# Patient Record
Sex: Male | Born: 1965
Health system: Southern US, Community
[De-identification: ages and names within clinical notes are randomized; demographics above are authoritative.]

## PROBLEM LIST (undated history)

## (undated) DIAGNOSIS — E079 Disorder of thyroid, unspecified: Secondary | ICD-10-CM

## (undated) DIAGNOSIS — R197 Diarrhea, unspecified: Secondary | ICD-10-CM

## (undated) DIAGNOSIS — I10 Essential (primary) hypertension: Secondary | ICD-10-CM

## (undated) DIAGNOSIS — E785 Hyperlipidemia, unspecified: Secondary | ICD-10-CM

## (undated) DIAGNOSIS — E119 Type 2 diabetes mellitus without complications: Secondary | ICD-10-CM

## (undated) HISTORY — DX: Essential (primary) hypertension: I10

## (undated) HISTORY — PX: INGUINAL HERNIA REPAIR: SUR1180

## (undated) HISTORY — PX: UMBILICAL HERNIA REPAIR: SUR1181

## (undated) HISTORY — DX: Hyperlipidemia, unspecified: E78.5

## (undated) HISTORY — DX: Type 2 diabetes mellitus without complications: E11.9

## (undated) HISTORY — PX: HERNIA REPAIR: SHX51

## (undated) HISTORY — PX: NASAL FRACTURE SURGERY: SHX718

---

## 1997-10-03 HISTORY — PX: NASAL FRACTURE SURGERY: SHX718

## 2005-06-27 ENCOUNTER — Emergency Department (HOSPITAL_COMMUNITY): Admission: EM | Admit: 2005-06-27 | Discharge: 2005-06-27 | Payer: Self-pay | Admitting: Emergency Medicine

## 2005-07-01 ENCOUNTER — Emergency Department (HOSPITAL_COMMUNITY): Admission: EM | Admit: 2005-07-01 | Discharge: 2005-07-01 | Payer: Self-pay | Admitting: Family Medicine

## 2006-04-21 ENCOUNTER — Emergency Department (HOSPITAL_COMMUNITY): Admission: EM | Admit: 2006-04-21 | Discharge: 2006-04-21 | Payer: Self-pay | Admitting: Family Medicine

## 2006-05-01 ENCOUNTER — Ambulatory Visit (HOSPITAL_COMMUNITY): Admission: RE | Admit: 2006-05-01 | Discharge: 2006-05-01 | Payer: Self-pay | Admitting: *Deleted

## 2009-05-08 ENCOUNTER — Emergency Department (HOSPITAL_COMMUNITY): Admission: EM | Admit: 2009-05-08 | Discharge: 2009-05-08 | Payer: Self-pay | Admitting: Family Medicine

## 2011-02-18 NOTE — Op Note (Signed)
NAMEGWYN, Dalton Arnold              ACCOUNT NO.:  [id number]   MEDICAL RECORD NO.:  [id number]          PATIENT TYPE:  AMB   LOCATION:  DAY                          FACILITY:  [facility]   PHYSICIAN:  Alfonse Ras, MD   DATE OF BIRTH:  07/27/1966   DATE OF PROCEDURE:  05/01/2006  DATE OF DISCHARGE:                                 OPERATIVE REPORT   PREOPERATIVE DIAGNOSIS:  Left inguinal hernia.   POSTOPERATIVE DIAGNOSIS:  Left inguinal hernia.   PROCEDURE:  Left inguinal hernia repair with mesh.   SURGEON:  Alfonse Ras, MD.   ANESTHESIA:  General.   DESCRIPTION OF PROCEDURE:  The patient was taken to the operating room,  placed in a supine position. After adequate general anesthesia was induced  using laryngeal mask, the left groin was prepped and draped in a normal  sterile fashion. Using an oblique incision over the left inguinal canal, I  dissected down onto the external oblique fascia.  This was opened along its  fibers.  The ilioinguinal nerve was dissected and identified and preserved.  The spermatic cord was run with a Penrose drain.  The indirect hernia sac  was identified and taken down to the internal ring where it was ligated with  a pursestring suture.  The floor of Hesselbach's triangle was reinforced  with #0 Surgilon sutures and a tension-free repair.  A piece of Prolene mesh  was then shaped and tacked using a running 2-0 Prolene suture tacking to the  pubic tubercle. The transversalis fascia of inguinal ligament split and  brought out lateral to the internal ring and tacked laterally.  The repair  was intact.  The fascia was closed with a running 3-0 Vicryl.  Skin was  closed with staples.  Tissues were injected with 0.5 Marcaine.  The patient  tolerated the procedure well and went to PACU in good condition.      Alfonse Ras, MD  Electronically Signed     KRE/MEDQ  D:  05/01/2006  T:  05/02/2006  Job:  [id number]

## 2012-07-16 ENCOUNTER — Encounter (HOSPITAL_COMMUNITY): Payer: Self-pay | Admitting: Emergency Medicine

## 2012-07-16 ENCOUNTER — Emergency Department (INDEPENDENT_AMBULATORY_CARE_PROVIDER_SITE_OTHER)
Admission: EM | Admit: 2012-07-16 | Discharge: 2012-07-16 | Disposition: A | Discharge status: Home / Self Care | Payer: Self-pay | Source: Home / Self Care

## 2012-07-16 DIAGNOSIS — M7989 Other specified soft tissue disorders: Secondary | ICD-10-CM

## 2012-07-16 DIAGNOSIS — S6390XA Sprain of unspecified part of unspecified wrist and hand, initial encounter: Secondary | ICD-10-CM

## 2012-07-16 DIAGNOSIS — S63619A Unspecified sprain of unspecified finger, initial encounter: Secondary | ICD-10-CM

## 2012-07-16 MED ORDER — IBUPROFEN 600 MG PO TABS: 600.0000 mg | ORAL_TABLET | Freq: Four times a day (QID) | ORAL | Status: DC | PRN

## 2012-07-16 NOTE — ED Notes (Signed)
Pt c/o right hand pain... He is an Journalist, newspaper and uses hands a lot... On Saturday, he noticed stiffness of right hand and particular his right index and middle finger... Pain and stiffness has gone down but is concerned.... Denies: fever, vomiting, nauseas, diarrhea.

## 2012-07-16 NOTE — ED Provider Notes (Signed)
History     CSN: [id number]  Arrival date & time 07/16/12  1320   None     Chief Complaint  Patient presents with  . Hand Pain    (Consider location/radiation/quality/duration/timing/severity/associated sxs/prior treatment) Patient is a 45 y.o. male presenting with hand pain. The history is provided by the patient.  Hand Pain This is a new problem. The current episode started 2 days ago. The problem occurs daily. The problem has been resolved. The symptoms are relieved by acetaminophen. He has tried acetaminophen for the symptoms. The treatment provided significant relief.  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 Noted stiffness in left middle finger with decreased ability to bend associated with swelling.  Swelling decreased but continued pain with movement. History reviewed. No pertinent past medical history.  History reviewed. No pertinent past surgical history.  No family history on file.  History  Substance Use Topics  . Smoking status: Current Every Day Smoker -- 1.0 packs/day    Types: Cigarettes  . Smokeless tobacco: Not on file  . Alcohol Use: Yes      Review of Systems  Constitutional: Negative.   Respiratory: Negative.   Cardiovascular: Negative.   Musculoskeletal: Positive for joint swelling.  Neurological: Negative.     Allergies  Review of patient's allergies indicates no known allergies.  Home Medications   Current Outpatient Rx  Name Route Sig Dispense Refill  . IBUPROFEN 600 MG PO TABS Oral Take 1 tablet (600 mg total) by mouth every 6 (six) hours as needed for pain. 30 tablet 0    BP 147/81  Pulse 66  Temp 98.8 F (37.1 C) (Oral)  Resp 16  SpO2 100%  Physical Exam  Nursing note and vitals reviewed. Constitutional: He is oriented to person, place, and time. Vital signs are normal. He appears well-developed and well-nourished. He is active and cooperative.  HENT:  Head: Normocephalic and atraumatic.  Eyes: Conjunctivae normal  are normal. Pupils are equal, round, and reactive to light. No scleral icterus.  Neck: Trachea normal. Neck supple.  Cardiovascular: Normal rate and regular rhythm.   Pulmonary/Chest: Effort normal and breath sounds normal.  Abdominal: Soft.  Musculoskeletal: Normal range of motion.       Right hand: He exhibits swelling. He exhibits normal range of motion, no tenderness, no bony tenderness, normal capillary refill, no deformity and no laceration. normal sensation noted. He exhibits no finger abduction, no thumb/finger opposition and no wrist extension trouble.       Hands: Neurological: He is alert and oriented to person, place, and time. He has normal reflexes. No cranial nerve deficit or sensory deficit.  Skin: Skin is warm and dry.  Psychiatric: He has a normal mood and affect. His speech is normal and behavior is normal. Judgment and thought content normal. Cognition and memory are normal.    ED Course  Procedures (including critical care time)  Labs Reviewed - No data to display No results found.   1. Finger sprain   2. Swelling of finger       MDM  Finger splint, nsaids.  Take medications as indicated.  Follow up with orthopedist as discussed.        Johnsie Kindred, NP 07/19/12 [phone]

## 2012-07-20 NOTE — ED Provider Notes (Signed)
Medical screening examination/treatment/procedure(s) were performed by resident physician or non-physician practitioner and as supervising physician I was immediately available for consultation/collaboration.   Barkley Bruns MD.    Linna Hoff, MD 07/20/12 [phone]

## 2014-02-19 ENCOUNTER — Encounter (HOSPITAL_COMMUNITY): Payer: Self-pay | Admitting: Emergency Medicine

## 2014-02-19 ENCOUNTER — Emergency Department (INDEPENDENT_AMBULATORY_CARE_PROVIDER_SITE_OTHER): Payer: BC Managed Care – PPO

## 2014-02-19 ENCOUNTER — Emergency Department (HOSPITAL_COMMUNITY)
Admission: EM | Admit: 2014-02-19 | Discharge: 2014-02-19 | Disposition: A | Discharge status: Home / Self Care | Payer: BC Managed Care – PPO | Source: Home / Self Care | Attending: Family Medicine | Admitting: Family Medicine

## 2014-02-19 DIAGNOSIS — M25569 Pain in unspecified knee: Secondary | ICD-10-CM

## 2014-02-19 MED ORDER — DICLOFENAC SODIUM 50 MG PO TBEC: 50.0000 mg | DELAYED_RELEASE_TABLET | Freq: Two times a day (BID) | ORAL | Status: DC | PRN

## 2014-02-19 NOTE — ED Notes (Signed)
Reports acute on set today of left knee pain.  States always kneeling at work doing work.  States pain is an achy sensation.  No otc meds taken.

## 2014-02-19 NOTE — Discharge Instructions (Signed)
Thank you for coming in today. Follow up with Dr. Alfonso Ramus at [facility] orthopedics soon.   Knee, Cartilage (Meniscus) Injury It is suspected that you have a torn cartilage (meniscus) in your knee. The menisci are made of tough cartilage, and fit between the surfaces of the thigh and leg bones. The menisci are "C"shaped and have a wedged profile. The wedged profile helps the stability of the joint by keeping the rounded femur surface from sliding off the flat tibial surface. The menisci are fed (nourished) by small blood vessels; but there is also a large area at the inner edge of the meniscus that does not have a good blood supply (avascular). This presents a problem when there is an injury to the meniscus, because areas without good blood supply heal poorly. As a result when there is a torn cartilage in the knee, surgery is often required to fix it. This is usually done with a surgical procedure less invasive than open surgery (arthroscopy). Some times open surgery of the knee is required if there is other damage. PURPOSE OF THE MENISCUS The medial meniscus rests on the medial tibial plateau. The tibia is the large bone in your lower leg (the shin bone). The medial tibial plateau is the upper end of the bone making up the inner part of your knee. The lateral meniscus serves the same purpose and is located on the outside of the knee. The menisci help to distribute your body weight across the knee joint; they act as shock absorbers. Without the meniscus present, the weight of your body would be unevenly applied to the bones in your legs (the femur and tibia). The femur is the large bone in your thigh. This uneven weight distribution would cause increased wear and tear on the cartilage lining the joint surfaces, leading to early damage (arthritis) of these areas. The presence of the menisci cartilage is necessary for a healthy knee. PURPOSE OF THE KNEE CARTILAGE The knee joint is made up of three bones: the  thigh bone (femur), the shin bone (tibia), and the knee cap (patella). The surfaces of these bones at the knee joint are covered with cartilage called articular cartilage. This smooth slippery surface allows the bones to slide against each other without causing bone damage. The meniscus sits between these cartilaginous surfaces of the bones. It distributes the weight evenly in the joints and helps with the stability of the joint (keeps the joint steady). HOME CARE INSTRUCTIONS  Use crutches and external braces as instructed.  Once home an ice pack applied to your injured knee may help with discomfort and keep the swelling down. An ice pack can be used for the first couple of days or as instructed.  Only take over-the-counter or prescription medicines for pain, discomfort, or fever as directed by your caregiver.  Call if you do not have relief of pain with medications or if there is increasing in pain.  Call if your foot becomes cold or blue.  You may resume normal diet and activities as directed.  Make sure to keep your appointment with your follow-up caregiver. This injury may require further evaluation and treatment beyond the temporary treatment given today. Document Released: 12/10/2002 Document Revised: 12/12/2011 Document Reviewed: 04/03/2009 [facility] Patient Information 2014 [place], [place].

## 2014-02-19 NOTE — ED Provider Notes (Signed)
Dalton Arnold is a 48 y.o. male who presents to Urgent Care today for left medial knee pain starting today without injury. Pain is located in the medial joint line. The pain is worse with activity better with rest. No radiating pain weakness or numbness. Patient works in a [employer] and asked to squat and kneel down. He has not tried any medications yet. He denies any fevers chills nausea vomiting or diarrhea.   History reviewed. No pertinent past medical history. History  Substance Use Topics  . Smoking status: Current Every Day Smoker -- 1.00 packs/day    Types: Cigarettes  . Smokeless tobacco: Not on file  . Alcohol Use: Yes   ROS as above Medications: No current facility-administered medications for this encounter.   Current Outpatient Prescriptions  Medication Sig Dispense Refill  . diclofenac (VOLTAREN) 50 MG EC tablet Take 1 tablet (50 mg total) by mouth 2 (two) times daily as needed.  60 tablet  0    Exam:  BP 148/91  Pulse 67  Temp(Src) 98.4 F (36.9 C) (Oral)  Resp 16  SpO2 100% Gen: Well NAD Knees: Left knee: Normal-appearing no effusion. Tender palpation medial joint line. Full range of motion without crepitation. Positive McMurray's test. Stable ligamentous exam.  Right knee: Normal-appearing no effusion. Tender palpation medial joint line. Full range of motion without crepitation. Negative McMurray's test. Stable ligamentous exam.  No results found for this or any previous visit (from the past 24 hour(s)). Dg Knee Complete 4 Views Left  02/19/2014   CLINICAL DATA:  Left knee pain without injury  EXAM: LEFT KNEE - COMPLETE 4+ VIEW  COMPARISON:  None.  FINDINGS: Minimal joint space narrowing is noted medially. No acute joint effusion is seen. No acute fracture or dislocation is noted.  IMPRESSION: Minimal joint space narrowing without acute abnormality.   Electronically Signed   By: Inez Catalina M.D.   On: 02/19/2014 14:44    Assessment and Plan: 48 y.o.  male with left knee pain. Concerning for medial meniscus injury. Plan to treat with diclofenac and followup with orthopedics.  Discussed warning signs or symptoms. Please see discharge instructions. Patient expresses understanding.    Gregor Hams, MD 02/19/14 1520

## 2014-06-24 ENCOUNTER — Emergency Department (HOSPITAL_COMMUNITY): Payer: BC Managed Care – PPO

## 2014-06-24 ENCOUNTER — Inpatient Hospital Stay (HOSPITAL_COMMUNITY)
Admission: EM | Admit: 2014-06-24 | Discharge: 2014-06-28 | DRG: 391 | Disposition: A | Discharge status: Home / Self Care | Payer: BC Managed Care – PPO | Attending: Internal Medicine | Admitting: Internal Medicine

## 2014-06-24 ENCOUNTER — Encounter (HOSPITAL_COMMUNITY): Payer: Self-pay | Admitting: Emergency Medicine

## 2014-06-24 DIAGNOSIS — K2980 Duodenitis without bleeding: Principal | ICD-10-CM | POA: Diagnosis present

## 2014-06-24 DIAGNOSIS — F17200 Nicotine dependence, unspecified, uncomplicated: Secondary | ICD-10-CM | POA: Diagnosis present

## 2014-06-24 DIAGNOSIS — Z789 Other specified health status: Secondary | ICD-10-CM

## 2014-06-24 DIAGNOSIS — K859 Acute pancreatitis without necrosis or infection, unspecified: Secondary | ICD-10-CM | POA: Diagnosis present

## 2014-06-24 DIAGNOSIS — K766 Portal hypertension: Secondary | ICD-10-CM | POA: Diagnosis present

## 2014-06-24 DIAGNOSIS — J439 Emphysema, unspecified: Secondary | ICD-10-CM

## 2014-06-24 DIAGNOSIS — E041 Nontoxic single thyroid nodule: Secondary | ICD-10-CM

## 2014-06-24 DIAGNOSIS — K2970 Gastritis, unspecified, without bleeding: Secondary | ICD-10-CM | POA: Diagnosis present

## 2014-06-24 DIAGNOSIS — Z7289 Other problems related to lifestyle: Secondary | ICD-10-CM

## 2014-06-24 DIAGNOSIS — E0789 Other specified disorders of thyroid: Secondary | ICD-10-CM | POA: Diagnosis present

## 2014-06-24 DIAGNOSIS — K429 Umbilical hernia without obstruction or gangrene: Secondary | ICD-10-CM | POA: Diagnosis present

## 2014-06-24 DIAGNOSIS — R1013 Epigastric pain: Secondary | ICD-10-CM | POA: Diagnosis not present

## 2014-06-24 DIAGNOSIS — K319 Disease of stomach and duodenum, unspecified: Secondary | ICD-10-CM | POA: Diagnosis present

## 2014-06-24 DIAGNOSIS — F1010 Alcohol abuse, uncomplicated: Secondary | ICD-10-CM | POA: Diagnosis present

## 2014-06-24 DIAGNOSIS — K2990 Gastroduodenitis, unspecified, without bleeding: Secondary | ICD-10-CM

## 2014-06-24 LAB — URINALYSIS, ROUTINE W REFLEX MICROSCOPIC
BILIRUBIN URINE: NEGATIVE
GLUCOSE, UA: NEGATIVE mg/dL
KETONES UR: NEGATIVE mg/dL
LEUKOCYTES UA: NEGATIVE
NITRITE: NEGATIVE
PH: 6.5 (ref 5.0–8.0)
Protein, ur: NEGATIVE mg/dL
SPECIFIC GRAVITY, URINE: 1.004 — AB (ref 1.005–1.030)
Urobilinogen, UA: 0.2 mg/dL (ref 0.0–1.0)

## 2014-06-24 LAB — CBC WITH DIFFERENTIAL/PLATELET
BASOS PCT: 0 % (ref 0–1)
Basophils Absolute: 0 10*3/uL (ref 0.0–0.1)
Eosinophils Absolute: 0.3 10*3/uL (ref 0.0–0.7)
Eosinophils Relative: 2 % (ref 0–5)
HCT: 44.7 % (ref 39.0–52.0)
Hemoglobin: 15.2 g/dL (ref 13.0–17.0)
LYMPHS ABS: 3.9 10*3/uL (ref 0.7–4.0)
Lymphocytes Relative: 32 % (ref 12–46)
MCH: 26.7 pg (ref 26.0–34.0)
MCHC: 34 g/dL (ref 30.0–36.0)
MCV: 78.6 fL (ref 78.0–100.0)
Monocytes Absolute: 0.7 10*3/uL (ref 0.1–1.0)
Monocytes Relative: 6 % (ref 3–12)
NEUTROS PCT: 60 % (ref 43–77)
Neutro Abs: 7.3 10*3/uL (ref 1.7–7.7)
PLATELETS: 337 10*3/uL (ref 150–400)
RBC: 5.69 MIL/uL (ref 4.22–5.81)
RDW: 14.5 % (ref 11.5–15.5)
WBC: 12.2 10*3/uL — ABNORMAL HIGH (ref 4.0–10.5)

## 2014-06-24 LAB — COMPREHENSIVE METABOLIC PANEL
ALT: 17 U/L (ref 0–53)
ANION GAP: 14 (ref 5–15)
AST: 18 U/L (ref 0–37)
Albumin: 4.3 g/dL (ref 3.5–5.2)
Alkaline Phosphatase: 77 U/L (ref 39–117)
BILIRUBIN TOTAL: 0.2 mg/dL — AB (ref 0.3–1.2)
BUN: 10 mg/dL (ref 6–23)
CALCIUM: 9.4 mg/dL (ref 8.4–10.5)
CO2: 23 mEq/L (ref 19–32)
CREATININE: 0.72 mg/dL (ref 0.50–1.35)
Chloride: 101 mEq/L (ref 96–112)
Glucose, Bld: 83 mg/dL (ref 70–99)
Potassium: 4 mEq/L (ref 3.7–5.3)
SODIUM: 138 meq/L (ref 137–147)
Total Protein: 7.5 g/dL (ref 6.0–8.3)

## 2014-06-24 LAB — URINE MICROSCOPIC-ADD ON

## 2014-06-24 LAB — PRO B NATRIURETIC PEPTIDE: PRO B NATRI PEPTIDE: 102.6 pg/mL (ref 0–125)

## 2014-06-24 LAB — LIPASE, BLOOD: LIPASE: 24 U/L (ref 11–59)

## 2014-06-24 LAB — TROPONIN I: Troponin I: <0.3 ng/mL (ref <0.30)

## 2014-06-24 MED ORDER — MORPHINE SULFATE 4 MG/ML IJ SOLN
4.0000 mg | Freq: Once | INTRAMUSCULAR | Status: AC
  Administered 2014-06-24: 4 mg via INTRAVENOUS
  Filled 2014-06-24: qty 1

## 2014-06-24 MED ORDER — ONDANSETRON 4 MG PO TBDP
4.0000 mg | ORAL_TABLET | Freq: Once | ORAL | Status: AC
  Administered 2014-06-24: 4 mg via ORAL
  Filled 2014-06-24: qty 1

## 2014-06-24 MED ORDER — SODIUM CHLORIDE 0.9 % IV BOLUS (SEPSIS)
1000.0000 mL | Freq: Once | INTRAVENOUS | Status: AC
  Administered 2014-06-24: 1000 mL via INTRAVENOUS

## 2014-06-24 MED ORDER — IOHEXOL 350 MG/ML SOLN
100.0000 mL | Freq: Once | INTRAVENOUS | Status: AC | PRN
  Administered 2014-06-24: 100 mL via INTRAVENOUS

## 2014-06-24 NOTE — ED Notes (Signed)
Bed: WA16 Expected date:  Expected time:  Means of arrival:  Comments: Triage 1 

## 2014-06-24 NOTE — ED Provider Notes (Signed)
CSN: [id number]     Arrival date & time 06/24/14  1739 History   First MD Initiated Contact with Patient 06/24/14 1758     Chief Complaint  Patient presents with  . Chest Pain     (Consider location/radiation/quality/duration/timing/severity/associated sxs/prior Treatment) The history is provided by the patient. No language interpreter was used.  Dalton Arnold is a 48 y/o M with no known significant PMHx presenting to the ED with chest pain that started on Sunday. Patient reported that the discomfort is localized to the epigastric region described as a burning sensation and stabbing pain that is intermittent with radiation to the chest wall. Stated that he took 2 Aleves yesterday morning and afternoon and this morning, reported that he took an ASA 81 mg today. Stated that when the pain increases he does get shortness of breath. Reported that he has been feeling nauseous. Smokes cigarettes. Denied difficulty breathing, diarrhea, hematochezia, melena, fevers, chills, cough, leg swelling, headache, dizziness, blurred vision, sudden loss of vision, hematuria, urinary symptoms, travel. PCP none   History reviewed. No pertinent past medical history. History reviewed. No pertinent past surgical history. History reviewed. No pertinent family history. History  Substance Use Topics  . Smoking status: Current Every Day Smoker -- 1.00 packs/day    Types: Cigarettes  . Smokeless tobacco: Not on file  . Alcohol Use: Yes    Review of Systems  Constitutional: Negative for fever and chills.  Respiratory: Positive for shortness of breath.   Cardiovascular: Positive for chest pain.  Gastrointestinal: Positive for nausea and abdominal pain. Negative for vomiting, diarrhea, constipation, blood in stool and anal bleeding.  Genitourinary: Negative for dysuria, hematuria and decreased urine volume.  Musculoskeletal: Negative for back pain, neck pain and neck stiffness.  Neurological: Negative for  dizziness, weakness, numbness and headaches.      Allergies  Review of patient's allergies indicates no known allergies.  Home Medications   Prior to Admission medications   Medication Sig Start Date End Date Taking? Authorizing Provider  naproxen sodium (ANAPROX) 220 MG tablet Take 440 mg by mouth 2 (two) times daily with a meal.   Yes Historical Provider, MD   BP 136/91  Pulse 71  Temp(Src) 98.3 F (36.8 C) (Oral)  Resp 18  SpO2 95% Physical Exam  Nursing note and vitals reviewed. Constitutional: He is oriented to person, place, and time. He appears well-developed and well-nourished. No distress.  HENT:  Head: Normocephalic and atraumatic.  Mouth/Throat: Oropharynx is clear and moist. No oropharyngeal exudate.  Eyes: Conjunctivae and EOM are normal. Pupils are equal, round, and reactive to light. Right eye exhibits no discharge. Left eye exhibits no discharge.  Neck: Normal range of motion. Neck supple. No tracheal deviation present.  Cardiovascular: Normal rate, regular rhythm and normal heart sounds.  Exam reveals no friction rub.   No murmur heard. Pulmonary/Chest: Effort normal and breath sounds normal. No respiratory distress. He has no wheezes. He has no rales. He exhibits no tenderness.  Abdominal: Soft. Normal appearance and bowel sounds are normal. He exhibits no distension. There is tenderness in the epigastric area. There is no rebound and no guarding.  Negative abdominal distension  BS normoactive in all 4 quadrants Abdomen soft upon palpation  Discomfort upon palpation to the abdomen - generalized - with most discomfort upon palpation to the epigastric region Negative peritoneal signs, guarding or rigidity noted  Musculoskeletal: Normal range of motion.  Full ROM to upper and lower extremities without difficulty noted, negative ataxia noted.  Lymphadenopathy:    He has no cervical adenopathy.  Neurological: He is alert and oriented to person, place, and time.  No cranial nerve deficit. He exhibits normal muscle tone. Coordination normal.  Cranial nerves III-XII grossly intact Strength 5+/5+ to upper and lower extremities bilaterally with resistance applied, equal distribution noted Equal grip strength bilaterally  Negative facial drooping Negative slurred speech  Negative aphasia Negative arm drift Fine motor skills intact  Skin: Skin is warm and dry. No rash noted. He is not diaphoretic. No erythema.  Psychiatric: He has a normal mood and affect. His behavior is normal. Thought content normal.    ED Course  Procedures (including critical care time)  12:41 AM This provider re-assessed the patient. Patient continues to have pain in his abdomen. Reported that he continues to not feel well.   1:35 AM This provider spoke with Dr. Posey Pronto, [facility]-discussed case, history, labs, imaging, vitals, ED course in great detail. Patient to be admitted to the hospital for pain control regarding acute duodenitis versus possible beginnings of acute pancreatitis, did not recommend Cipro and Flagyl to be given-recommended patient to continue to have IV fluids administered. Patient admitted to [facility].  Results for orders placed during the hospital encounter of 06/24/14  CBC WITH DIFFERENTIAL      Result Value Ref Range   WBC 12.2 (*) 4.0 - 10.5 K/uL   RBC 5.69  4.22 - 5.81 MIL/uL   Hemoglobin 15.2  13.0 - 17.0 g/dL   HCT 44.7  39.0 - 52.0 %   MCV 78.6  78.0 - 100.0 fL   MCH 26.7  26.0 - 34.0 pg   MCHC 34.0  30.0 - 36.0 g/dL   RDW 14.5  11.5 - 15.5 %   Platelets 337  150 - 400 K/uL   Neutrophils Relative % 60  43 - 77 %   Neutro Abs 7.3  1.7 - 7.7 K/uL   Lymphocytes Relative 32  12 - 46 %   Lymphs Abs 3.9  0.7 - 4.0 K/uL   Monocytes Relative 6  3 - 12 %   Monocytes Absolute 0.7  0.1 - 1.0 K/uL   Eosinophils Relative 2  0 - 5 %   Eosinophils Absolute 0.3  0.0 - 0.7 K/uL   Basophils Relative 0  0 - 1 %   Basophils Absolute 0.0  0.0 - 0.1 K/uL   COMPREHENSIVE METABOLIC PANEL      Result Value Ref Range   Sodium 138  137 - 147 mEq/L   Potassium 4.0  3.7 - 5.3 mEq/L   Chloride 101  96 - 112 mEq/L   CO2 23  19 - 32 mEq/L   Glucose, Bld 83  70 - 99 mg/dL   BUN 10  6 - 23 mg/dL   Creatinine, Ser 0.72  0.50 - 1.35 mg/dL   Calcium 9.4  8.4 - 10.5 mg/dL   Total Protein 7.5  6.0 - 8.3 g/dL   Albumin 4.3  3.5 - 5.2 g/dL   AST 18  0 - 37 U/L   ALT 17  0 - 53 U/L   Alkaline Phosphatase 77  39 - 117 U/L   Total Bilirubin 0.2 (*) 0.3 - 1.2 mg/dL   GFR calc non Af Amer >90  >90 mL/min   GFR calc Af Amer >90  >90 mL/min   Anion gap 14  5 - 15  TROPONIN I      Result Value Ref Range   Troponin I <  0.30  <0.30 ng/mL  PRO B NATRIURETIC PEPTIDE      Result Value Ref Range   Pro B Natriuretic peptide (BNP) 102.6  0 - 125 pg/mL  LIPASE, BLOOD      Result Value Ref Range   Lipase 24  11 - 59 U/L  URINALYSIS, ROUTINE W REFLEX MICROSCOPIC      Result Value Ref Range   Color, Urine YELLOW  YELLOW   APPearance CLEAR  CLEAR   Specific Gravity, Urine 1.004 (*) 1.005 - 1.030   pH 6.5  5.0 - 8.0   Glucose, UA NEGATIVE  NEGATIVE mg/dL   Hgb urine dipstick SMALL (*) NEGATIVE   Bilirubin Urine NEGATIVE  NEGATIVE   Ketones, ur NEGATIVE  NEGATIVE mg/dL   Protein, ur NEGATIVE  NEGATIVE mg/dL   Urobilinogen, UA 0.2  0.0 - 1.0 mg/dL   Nitrite NEGATIVE  NEGATIVE   Leukocytes, UA NEGATIVE  NEGATIVE  URINE MICROSCOPIC-ADD ON      Result Value Ref Range   Squamous Epithelial / LPF RARE  RARE   RBC / HPF 3-6  <3 RBC/hpf  TROPONIN I      Result Value Ref Range   Troponin I <0.30  <0.30 ng/mL    Labs Review Labs Reviewed  CBC WITH DIFFERENTIAL - Abnormal; Notable for the following:    WBC 12.2 (*)    All other components within normal limits  COMPREHENSIVE METABOLIC PANEL - Abnormal; Notable for the following:    Total Bilirubin 0.2 (*)    All other components within normal limits  URINALYSIS, ROUTINE W REFLEX MICROSCOPIC - Abnormal; Notable  for the following:    Specific Gravity, Urine 1.004 (*)    Hgb urine dipstick SMALL (*)    All other components within normal limits  TROPONIN I  PRO B NATRIURETIC PEPTIDE  LIPASE, BLOOD  URINE MICROSCOPIC-ADD ON  TROPONIN I  ETHANOL    Imaging Review Dg Chest 2 View  06/24/2014   CLINICAL DATA:  Chest pain for 2 days  EXAM: CHEST  2 VIEW  COMPARISON:  04/28/2006  FINDINGS: Left lower lobe hazy airspace disease which may reflect atelectasis versus pneumonia. No other focal consolidation, pleural effusion or pneumothorax. The heart and mediastinal contours are unremarkable.  The osseous structures are unremarkable.  IMPRESSION: Hazy left lower lobe airspace disease concerning for atelectasis versus pneumonia.   Electronically Signed   By: Kathreen Devoid   On: 06/24/2014 19:24   Ct Angio Chest Pe W/cm &/or Wo Cm  06/24/2014   CLINICAL DATA:  Chest pain, abdominal pain, nausea  EXAM: CT CHEST, ABDOMEN AND PELVIS WITHOUT CONTRAST  TECHNIQUE: Multidetector CT imaging of the chest, abdomen and pelvis was performed following the standard protocol without IV contrast.  COMPARISON:  Prior radiograph from 06/24/2014  FINDINGS: CT CHEST FINDINGS  Large heterogeneous predominantly hypodense nodule measuring approximately 3.9 x 3.6 cm seen within the left thyroid lobe. The tracheal air column is bowed to the right at this level but remains widely patent.  No pathologically enlarged mediastinal, hilar, or axillary lymph nodes are identified.  Intrathoracic aorta is of normal caliber and appearance. Great vessels within normal limits. Minimal atherosclerotic plaque present at the origin of the left common carotid artery.  Heart size is normal. No pericardial effusion. Diffuse 3 vessel coronary artery calcifications present.  Pulmonary arterial tree is well opacified. No filling defect to suggest acute pulmonary embolism identified. Re-formatted imaging confirms these findings.  Subsegmental atelectasis seen  dependently within  the visualized lung bases. No focal infiltrates identified. No pulmonary edema or pleural effusion. No worrisome pulmonary nodule or mass. Mild upper lobe predominant centrilobular emphysema present.  No acute osseus abnormality. No worrisome lytic or blastic osseous lesions.  CT ABDOMEN AND PELVIS FINDINGS  The liver demonstrates a normal contrast enhanced appearance. Gallbladder within normal limits. The spleen and adrenal glands are unremarkable. The pancreas is within normal limits. Delete that  Scattered cyst noted within the bilateral kidneys. No nephrolithiasis, hydronephrosis, or focal enhancing renal mass.  Stomach within normal limits.  There are circumferential mucosal enhancement with wall thickening and inflammatory stranding about the second portion of the duodenum, suggesting acute to edematous. These inflammatory changes closely approximates the pancreatic head, although the pancreas itself is favored to be within normal limits. No evidence of bowel obstruction. Appendix is normal. No other acute inflammatory changes seen about the bowels.  Fact containing paraumbilical hernia noted.  Bladder and prostate within normal limits.  No free air or fluid. No pathologically enlarged intra-abdominal pelvic lymph nodes. Moderate aorto bi-iliac atherosclerotic calcifications present without aneurysm.  No acute osseous abnormality. No worrisome lytic or blastic osseous lesion.  IMPRESSION: CT CHEST IMPRESSION:  1. No CT evidence of acute pulmonary embolism or other acute cardiopulmonary abnormality. 2. Mild upper lobe predominant centrilobular emphysema. 3. 3.9 x 3.6 cm left thyroid nodule. Further evaluation with dedicated thyroid ultrasound recommended. This could be performed on a nonemergent basis.  CT ABDOMEN AND PELVIS IMPRESSION:  1. Circumferential wall thickening with mucosal enhancement about the second portion of the duodenum, suggesting acute duodenitis. Inflammatory stranding  within the adjacent peripancreatic fat is favored to be reactive in nature from the acute process in the duodenum, although possible acute pancreatitis not entirely excluded. Correlation with serum lipase recommended. 2. No other acute intra-abdominal or pelvic process.   Electronically Signed   By: Jeannine Boga M.D.   On: 06/24/2014 23:16   Ct Abdomen Pelvis W Contrast  06/24/2014   CLINICAL DATA:  Chest pain, abdominal pain, nausea  EXAM: CT CHEST, ABDOMEN AND PELVIS WITHOUT CONTRAST  TECHNIQUE: Multidetector CT imaging of the chest, abdomen and pelvis was performed following the standard protocol without IV contrast.  COMPARISON:  Prior radiograph from 06/24/2014  FINDINGS: CT CHEST FINDINGS  Large heterogeneous predominantly hypodense nodule measuring approximately 3.9 x 3.6 cm seen within the left thyroid lobe. The tracheal air column is bowed to the right at this level but remains widely patent.  No pathologically enlarged mediastinal, hilar, or axillary lymph nodes are identified.  Intrathoracic aorta is of normal caliber and appearance. Great vessels within normal limits. Minimal atherosclerotic plaque present at the origin of the left common carotid artery.  Heart size is normal. No pericardial effusion. Diffuse 3 vessel coronary artery calcifications present.  Pulmonary arterial tree is well opacified. No filling defect to suggest acute pulmonary embolism identified. Re-formatted imaging confirms these findings.  Subsegmental atelectasis seen dependently within the visualized lung bases. No focal infiltrates identified. No pulmonary edema or pleural effusion. No worrisome pulmonary nodule or mass. Mild upper lobe predominant centrilobular emphysema present.  No acute osseus abnormality. No worrisome lytic or blastic osseous lesions.  CT ABDOMEN AND PELVIS FINDINGS  The liver demonstrates a normal contrast enhanced appearance. Gallbladder within normal limits. The spleen and adrenal glands are  unremarkable. The pancreas is within normal limits. Delete that  Scattered cyst noted within the bilateral kidneys. No nephrolithiasis, hydronephrosis, or focal enhancing renal mass.  Stomach within normal limits.  There are circumferential mucosal enhancement with wall thickening and inflammatory stranding about the second portion of the duodenum, suggesting acute to edematous. These inflammatory changes closely approximates the pancreatic head, although the pancreas itself is favored to be within normal limits. No evidence of bowel obstruction. Appendix is normal. No other acute inflammatory changes seen about the bowels.  Fact containing paraumbilical hernia noted.  Bladder and prostate within normal limits.  No free air or fluid. No pathologically enlarged intra-abdominal pelvic lymph nodes. Moderate aorto bi-iliac atherosclerotic calcifications present without aneurysm.  No acute osseous abnormality. No worrisome lytic or blastic osseous lesion.  IMPRESSION: CT CHEST IMPRESSION:  1. No CT evidence of acute pulmonary embolism or other acute cardiopulmonary abnormality. 2. Mild upper lobe predominant centrilobular emphysema. 3. 3.9 x 3.6 cm left thyroid nodule. Further evaluation with dedicated thyroid ultrasound recommended. This could be performed on a nonemergent basis.  CT ABDOMEN AND PELVIS IMPRESSION:  1. Circumferential wall thickening with mucosal enhancement about the second portion of the duodenum, suggesting acute duodenitis. Inflammatory stranding within the adjacent peripancreatic fat is favored to be reactive in nature from the acute process in the duodenum, although possible acute pancreatitis not entirely excluded. Correlation with serum lipase recommended. 2. No other acute intra-abdominal or pelvic process.   Electronically Signed   By: Jeannine Boga M.D.   On: 06/24/2014 23:16     EKG Interpretation None      MDM   Final diagnoses:  Duodenitis  Acute pancreatitis, unspecified  pancreatitis type  Pulmonary emphysema, unspecified emphysema type  Left thyroid nodule    Medications  ondansetron (ZOFRAN-ODT) disintegrating tablet 4 mg (4 mg Oral Given 06/24/14 1844)  sodium chloride 0.9 % bolus 1,000 mL (0 mLs Intravenous Stopped 06/24/14 2011)  morphine 4 MG/ML injection 4 mg (4 mg Intravenous Given 06/24/14 2220)  iohexol (OMNIPAQUE) 350 MG/ML injection 100 mL (100 mLs Intravenous Contrast Given 06/24/14 2235)  morphine 4 MG/ML injection 4 mg (4 mg Intravenous Given 06/25/14 0050)  sodium chloride 0.9 % bolus 1,000 mL (1,000 mLs Intravenous New Bag/Given 06/25/14 0050)   Filed Vitals:   06/24/14 1749 06/24/14 1942 06/24/14 2143 06/24/14 2144  BP: 142/98 153/99 153/95 136/91  Pulse: 70 68 71 71  Temp: 98.3 F (36.8 C)     TempSrc: Oral     Resp: 17 16 20 18   SpO2: 98% 100% 95% 95%   EKG normal sinus rhythm with a heart rate of 71 bpm. Troponin negative elevation. Second troponin negative elevation. BNP negative elevation. CBC noted mildly elevated white blood cell count of 12.2. CMP unremarkable. Lipase unremarkable. Urinalysis unremarkable. Chest x-ray noted hazy left lower lobe airspace disease concerning for atelectasis versus pneumonia. CT abdomen pelvis with contrast noted a wall thickening with mucosal enhancement around the second portion of the duodenum suggesting duodenitis. Stranding along the peripancreatic fat favored to be possible acute pancreatitis. CT chest mild upper lobe emphysema noted-no acute pulmonary embolism identified. Patient has been in ED setting for over 7 hours and continues to have abdominal pain, pain medications administered via IV-discomfort continues to remain. Nausea resolved. Mild leukocytosis noted-suspicion of acute duodenitis versus possible beginnings of pancreatitis. Left thyroid nodule identified-discussed this with patient in great detail. Patient does drink alcohol, whiskey, occasionally over the weekend. Patient to be admitted to  the hospital regarding pain control. Discussed case in great detail with Dr. Chiquita Loth to be admitted to [place]. Discussed plan for admission with patient who agrees to plan of care. Patient stable  for transfer.  Jamse Mead, PA-C 06/25/14 [phone]

## 2014-06-24 NOTE — ED Notes (Signed)
Pt sts he took 2 aleve and 1 aspirin to help with the pain this am, approx 10am, and another 2 additional aleve around 2pm.

## 2014-06-24 NOTE — ED Notes (Signed)
Pt reports to ED for upper abdominal pain radiating to central chest and nipples starting Saturday, as well as nausea. Denies dizziness, SOB, weakness.

## 2014-06-24 NOTE — ED Notes (Signed)
Pt is tearful, expressing stresses in life.  Spent time with him and comforted him.

## 2014-06-25 ENCOUNTER — Encounter (HOSPITAL_COMMUNITY): Payer: Self-pay | Admitting: Emergency Medicine

## 2014-06-25 ENCOUNTER — Inpatient Hospital Stay (HOSPITAL_COMMUNITY): Payer: BC Managed Care – PPO

## 2014-06-25 DIAGNOSIS — K766 Portal hypertension: Secondary | ICD-10-CM | POA: Diagnosis present

## 2014-06-25 DIAGNOSIS — K2990 Gastroduodenitis, unspecified, without bleeding: Secondary | ICD-10-CM | POA: Diagnosis present

## 2014-06-25 DIAGNOSIS — F17200 Nicotine dependence, unspecified, uncomplicated: Secondary | ICD-10-CM | POA: Diagnosis present

## 2014-06-25 DIAGNOSIS — K429 Umbilical hernia without obstruction or gangrene: Secondary | ICD-10-CM | POA: Diagnosis present

## 2014-06-25 DIAGNOSIS — R1013 Epigastric pain: Secondary | ICD-10-CM | POA: Diagnosis present

## 2014-06-25 DIAGNOSIS — K2980 Duodenitis without bleeding: Secondary | ICD-10-CM | POA: Diagnosis present

## 2014-06-25 DIAGNOSIS — K859 Acute pancreatitis without necrosis or infection, unspecified: Secondary | ICD-10-CM

## 2014-06-25 DIAGNOSIS — Z789 Other specified health status: Secondary | ICD-10-CM

## 2014-06-25 DIAGNOSIS — K319 Disease of stomach and duodenum, unspecified: Secondary | ICD-10-CM | POA: Diagnosis present

## 2014-06-25 DIAGNOSIS — Z7289 Other problems related to lifestyle: Secondary | ICD-10-CM | POA: Diagnosis present

## 2014-06-25 DIAGNOSIS — E0789 Other specified disorders of thyroid: Secondary | ICD-10-CM | POA: Diagnosis present

## 2014-06-25 DIAGNOSIS — I369 Nonrheumatic tricuspid valve disorder, unspecified: Secondary | ICD-10-CM

## 2014-06-25 DIAGNOSIS — F1010 Alcohol abuse, uncomplicated: Secondary | ICD-10-CM | POA: Diagnosis present

## 2014-06-25 DIAGNOSIS — K2970 Gastritis, unspecified, without bleeding: Secondary | ICD-10-CM | POA: Diagnosis present

## 2014-06-25 LAB — CBC WITH DIFFERENTIAL/PLATELET
Basophils Absolute: 0 10*3/uL (ref 0.0–0.1)
Basophils Relative: 0 % (ref 0–1)
Eosinophils Absolute: 0.3 10*3/uL (ref 0.0–0.7)
Eosinophils Relative: 2 % (ref 0–5)
HCT: 43.4 % (ref 39.0–52.0)
HEMOGLOBIN: 14.4 g/dL (ref 13.0–17.0)
Lymphocytes Relative: 32 % (ref 12–46)
Lymphs Abs: 3.8 10*3/uL (ref 0.7–4.0)
MCH: 26.3 pg (ref 26.0–34.0)
MCHC: 33.2 g/dL (ref 30.0–36.0)
MCV: 79.3 fL (ref 78.0–100.0)
Monocytes Absolute: 0.8 10*3/uL (ref 0.1–1.0)
Monocytes Relative: 6 % (ref 3–12)
NEUTROS ABS: 7.1 10*3/uL (ref 1.7–7.7)
NEUTROS PCT: 60 % (ref 43–77)
Platelets: 314 10*3/uL (ref 150–400)
RBC: 5.47 MIL/uL (ref 4.22–5.81)
RDW: 14.6 % (ref 11.5–15.5)
WBC: 11.9 10*3/uL — ABNORMAL HIGH (ref 4.0–10.5)

## 2014-06-25 LAB — COMPREHENSIVE METABOLIC PANEL
ALBUMIN: 3.5 g/dL (ref 3.5–5.2)
ALK PHOS: 68 U/L (ref 39–117)
ALT: 14 U/L (ref 0–53)
ANION GAP: 10 (ref 5–15)
AST: 12 U/L (ref 0–37)
BILIRUBIN TOTAL: 0.4 mg/dL (ref 0.3–1.2)
BUN: 8 mg/dL (ref 6–23)
CHLORIDE: 107 meq/L (ref 96–112)
CO2: 25 mEq/L (ref 19–32)
Calcium: 8.7 mg/dL (ref 8.4–10.5)
Creatinine, Ser: 0.71 mg/dL (ref 0.50–1.35)
GFR calc Af Amer: >90 mL/min (ref >90)
GFR calc non Af Amer: >90 mL/min (ref >90)
Glucose, Bld: 100 mg/dL — ABNORMAL HIGH (ref 70–99)
POTASSIUM: 3.8 meq/L (ref 3.7–5.3)
Sodium: 142 mEq/L (ref 137–147)
Total Protein: 6.5 g/dL (ref 6.0–8.3)

## 2014-06-25 LAB — ETHANOL

## 2014-06-25 LAB — PROTIME-INR
INR: 1.04 (ref 0.00–1.49)
Prothrombin Time: 13.6 seconds (ref 11.6–15.2)

## 2014-06-25 LAB — TSH: TSH: 0.267 u[IU]/mL — AB (ref 0.350–4.500)

## 2014-06-25 LAB — T4: T4 TOTAL: 9.1 ug/dL (ref 4.5–12.0)

## 2014-06-25 LAB — T3, FREE: T3 FREE: 3.8 pg/mL (ref 2.3–4.2)

## 2014-06-25 LAB — TROPONIN I: Troponin I: <0.3 ng/mL (ref <0.30)

## 2014-06-25 MED ORDER — MORPHINE SULFATE 4 MG/ML IJ SOLN
4.0000 mg | Freq: Once | INTRAMUSCULAR | Status: AC
  Administered 2014-06-25: 4 mg via INTRAVENOUS
  Filled 2014-06-25: qty 1

## 2014-06-25 MED ORDER — MORPHINE SULFATE 2 MG/ML IJ SOLN
2.0000 mg | INTRAMUSCULAR | Status: DC | PRN
  Administered 2014-06-25 – 2014-06-26 (×4): 2 mg via INTRAVENOUS
  Filled 2014-06-25 (×6): qty 1

## 2014-06-25 MED ORDER — ONDANSETRON HCL 4 MG PO TABS
4.0000 mg | ORAL_TABLET | Freq: Four times a day (QID) | ORAL | Status: DC | PRN
Start: 2014-06-25 — End: 2014-06-25

## 2014-06-25 MED ORDER — NICOTINE 14 MG/24HR TD PT24
14.0000 mg | MEDICATED_PATCH | Freq: Every day | TRANSDERMAL | Status: DC
  Administered 2014-06-25 – 2014-06-28 (×4): 14 mg via TRANSDERMAL
  Filled 2014-06-25 (×4): qty 1

## 2014-06-25 MED ORDER — ACETAMINOPHEN 325 MG PO TABS: 650.0000 mg | ORAL_TABLET | Freq: Four times a day (QID) | ORAL | Status: DC | PRN

## 2014-06-25 MED ORDER — ONDANSETRON HCL 4 MG/2ML IJ SOLN: 4.0000 mg | Freq: Four times a day (QID) | INTRAMUSCULAR | Status: DC | PRN

## 2014-06-25 MED ORDER — CIPROFLOXACIN HCL 500 MG PO TABS: 500.0000 mg | ORAL_TABLET | Freq: Once | ORAL | Status: DC

## 2014-06-25 MED ORDER — SODIUM CHLORIDE 0.9 % IV BOLUS (SEPSIS)
1000.0000 mL | Freq: Once | INTRAVENOUS | Status: AC
  Administered 2014-06-25: 1000 mL via INTRAVENOUS

## 2014-06-25 MED ORDER — SODIUM CHLORIDE 0.9 % IV SOLN
INTRAVENOUS | Status: DC
  Administered 2014-06-25: 06:00:00 via INTRAVENOUS

## 2014-06-25 MED ORDER — ENOXAPARIN SODIUM 40 MG/0.4ML ~~LOC~~ SOLN
40.0000 mg | SUBCUTANEOUS | Status: DC
  Administered 2014-06-25 – 2014-06-26 (×2): 40 mg via SUBCUTANEOUS
  Filled 2014-06-25 (×2): qty 0.4

## 2014-06-25 MED ORDER — PANTOPRAZOLE SODIUM 40 MG IV SOLR
40.0000 mg | Freq: Two times a day (BID) | INTRAVENOUS | Status: DC
  Administered 2014-06-25 – 2014-06-28 (×7): 40 mg via INTRAVENOUS
  Filled 2014-06-25 (×8): qty 40

## 2014-06-25 MED ORDER — POTASSIUM CHLORIDE IN NACL 20-0.9 MEQ/L-% IV SOLN
INTRAVENOUS | Status: AC
  Administered 2014-06-25 (×2): via INTRAVENOUS
  Administered 2014-06-26: 75 mL/h via INTRAVENOUS
  Administered 2014-06-27: 02:00:00 via INTRAVENOUS
  Filled 2014-06-25 (×4): qty 1000

## 2014-06-25 MED ORDER — OXYCODONE HCL 5 MG PO TABS
5.0000 mg | ORAL_TABLET | ORAL | Status: DC | PRN
  Administered 2014-06-25 – 2014-06-26 (×2): 5 mg via ORAL
  Filled 2014-06-25 (×3): qty 1

## 2014-06-25 MED ORDER — ACETAMINOPHEN 650 MG RE SUPP: 650.0000 mg | Freq: Four times a day (QID) | RECTAL | Status: DC | PRN

## 2014-06-25 MED ORDER — METRONIDAZOLE 500 MG PO TABS: 500.0000 mg | ORAL_TABLET | Freq: Once | ORAL | Status: DC

## 2014-06-25 NOTE — Progress Notes (Signed)
Echocardiogram 2D Echocardiogram has been performed.  Dalton Arnold 06/25/2014, 2:26 PM

## 2014-06-25 NOTE — H&P (Signed)
[facility] History and Physical  Patient: Dalton Arnold  FHQ:[id number]  DOB: 30-Jun-1966  DOS: the patient was seen and examined on 06/25/2014 PCP: No PCP Per Patient  Chief Complaint: Abdominal pain  HPI: Dalton Arnold is a 48 y.o. male with Past medical history of alcohol use as well as an active smoker. Patient presented with complaints of abdominal pain that has been ongoing since last Friday. Patient mentions the pain is located in the epigastric region and was initially started as a burning sensation. Later on the pain worsened to sharp pain and was radiating to his chest wall as well as between his spine. He mentions he has been taking Aleve on and off to help with his pain. He denies any fever or chills he denies any vomiting but complains of nausea. He denies any diarrhea or active bleeding. He denies any burning urination. He denies any recent change in his medication prior to this pain. He denies any drug abuse. He mentions he has been active smoker and smokes one to 2 pack a day. He also mentions he drinks alcohol every now and then but since last Friday he has been drinking almost on a daily basis.  The patient is coming from home. And at his baseline independent for most of his ADL.  Review of Systems: as mentioned in the history of present illness.  A Comprehensive review of the other systems is negative.  History reviewed. No pertinent past medical history. History reviewed. No pertinent past surgical history. Social History:  reports that he has been smoking Cigarettes.  He has been smoking about 1.00 pack per day. He does not have any smokeless tobacco history on file. He reports that he drinks alcohol. He reports that he does not use illicit drugs.  No Known Allergies  History reviewed. No pertinent family history.  Prior to Admission medications   Medication Sig Start Date End Date Taking? Authorizing Provider  naproxen sodium (ANAPROX) 220 MG  tablet Take 440 mg by mouth 2 (two) times daily with a meal.   Yes Historical Provider, MD    Physical Exam: Filed Vitals:   06/24/14 2143 06/24/14 2144 06/25/14 0151 06/25/14 0511  BP: 153/95 136/91 143/89 137/90  Pulse: 71 71 62 64  Temp:    98.3 F (36.8 C)  TempSrc:    Oral  Resp: 20 18 18 18   SpO2: 95% 95% 98% 94%    General: Alert, Awake and Oriented to Time, Place and Person. Appear in mild distress Eyes: PERRL ENT: Oral Mucosa clear dry. Neck: No JVD Cardiovascular: S1 and S2 Present, no Murmur, Peripheral Pulses Present Respiratory: Bilateral Air entry equal and Decreased, Clear to Auscultation, no Crackles, no wheezes Abdomen: Bowel Sound present, Soft and diffuse tender Skin: No Rash Extremities: No Pedal edema, no calf tenderness Neurologic: Grossly no focal neuro deficit.  Labs on Admission:  CBC:  Recent Labs Lab 06/24/14 1829 06/25/14 0457  WBC 12.2* 11.9*  NEUTROABS 7.3 7.1  HGB 15.2 14.4  HCT 44.7 43.4  MCV 78.6 79.3  PLT 337 314    CMP     Component Value Date/Time   NA 142 06/25/2014 0457   K 3.8 06/25/2014 0457   CL 107 06/25/2014 0457   CO2 25 06/25/2014 0457   GLUCOSE 100* 06/25/2014 0457   BUN 8 06/25/2014 0457   CREATININE 0.71 06/25/2014 0457   CALCIUM 8.7 06/25/2014 0457   PROT 6.5 06/25/2014 0457   ALBUMIN 3.5 06/25/2014 0457   AST  12 06/25/2014 0457   ALT 14 06/25/2014 0457   ALKPHOS 68 06/25/2014 0457   BILITOT 0.4 06/25/2014 0457   GFRNONAA >90 06/25/2014 0457   GFRAA >90 06/25/2014 0457     Recent Labs Lab 06/24/14 1829  LIPASE 24   No results found for this basename: AMMONIA,  in the last 168 hours   Recent Labs Lab 06/24/14 1829 06/24/14 2225 06/25/14 0457  TROPONINI <0.30 <0.30 <0.30   BNP (last 3 results)  Recent Labs  06/24/14 1830  PROBNP 102.6    Radiological Exams on Admission: Dg Chest 2 View  06/24/2014   CLINICAL DATA:  Chest pain for 2 days  EXAM: CHEST  2 VIEW  COMPARISON:  04/28/2006  FINDINGS: Left  lower lobe hazy airspace disease which may reflect atelectasis versus pneumonia. No other focal consolidation, pleural effusion or pneumothorax. The heart and mediastinal contours are unremarkable.  The osseous structures are unremarkable.  IMPRESSION: Hazy left lower lobe airspace disease concerning for atelectasis versus pneumonia.   Electronically Signed   By: Kathreen Devoid   On: 06/24/2014 19:24   Ct Angio Chest Pe W/cm &/or Wo Cm  06/24/2014   CLINICAL DATA:  Chest pain, abdominal pain, nausea  EXAM: CT CHEST, ABDOMEN AND PELVIS WITHOUT CONTRAST  TECHNIQUE: Multidetector CT imaging of the chest, abdomen and pelvis was performed following the standard protocol without IV contrast.  COMPARISON:  Prior radiograph from 06/24/2014  FINDINGS: CT CHEST FINDINGS  Large heterogeneous predominantly hypodense nodule measuring approximately 3.9 x 3.6 cm seen within the left thyroid lobe. The tracheal air column is bowed to the right at this level but remains widely patent.  No pathologically enlarged mediastinal, hilar, or axillary lymph nodes are identified.  Intrathoracic aorta is of normal caliber and appearance. Great vessels within normal limits. Minimal atherosclerotic plaque present at the origin of the left common carotid artery.  Heart size is normal. No pericardial effusion. Diffuse 3 vessel coronary artery calcifications present.  Pulmonary arterial tree is well opacified. No filling defect to suggest acute pulmonary embolism identified. Re-formatted imaging confirms these findings.  Subsegmental atelectasis seen dependently within the visualized lung bases. No focal infiltrates identified. No pulmonary edema or pleural effusion. No worrisome pulmonary nodule or mass. Mild upper lobe predominant centrilobular emphysema present.  No acute osseus abnormality. No worrisome lytic or blastic osseous lesions.  CT ABDOMEN AND PELVIS FINDINGS  The liver demonstrates a normal contrast enhanced appearance. Gallbladder  within normal limits. The spleen and adrenal glands are unremarkable. The pancreas is within normal limits. Delete that  Scattered cyst noted within the bilateral kidneys. No nephrolithiasis, hydronephrosis, or focal enhancing renal mass.  Stomach within normal limits.  There are circumferential mucosal enhancement with wall thickening and inflammatory stranding about the second portion of the duodenum, suggesting acute to edematous. These inflammatory changes closely approximates the pancreatic head, although the pancreas itself is favored to be within normal limits. No evidence of bowel obstruction. Appendix is normal. No other acute inflammatory changes seen about the bowels.  Fact containing paraumbilical hernia noted.  Bladder and prostate within normal limits.  No free air or fluid. No pathologically enlarged intra-abdominal pelvic lymph nodes. Moderate aorto bi-iliac atherosclerotic calcifications present without aneurysm.  No acute osseous abnormality. No worrisome lytic or blastic osseous lesion.  IMPRESSION: CT CHEST IMPRESSION:  1. No CT evidence of acute pulmonary embolism or other acute cardiopulmonary abnormality. 2. Mild upper lobe predominant centrilobular emphysema. 3. 3.9 x 3.6 cm left thyroid nodule.  Further evaluation with dedicated thyroid ultrasound recommended. This could be performed on a nonemergent basis.  CT ABDOMEN AND PELVIS IMPRESSION:  1. Circumferential wall thickening with mucosal enhancement about the second portion of the duodenum, suggesting acute duodenitis. Inflammatory stranding within the adjacent peripancreatic fat is favored to be reactive in nature from the acute process in the duodenum, although possible acute pancreatitis not entirely excluded. Correlation with serum lipase recommended. 2. No other acute intra-abdominal or pelvic process.   Electronically Signed   By: Jeannine Boga M.D.   On: 06/24/2014 23:16   Ct Abdomen Pelvis W Contrast  06/24/2014   CLINICAL  DATA:  Chest pain, abdominal pain, nausea  EXAM: CT CHEST, ABDOMEN AND PELVIS WITHOUT CONTRAST  TECHNIQUE: Multidetector CT imaging of the chest, abdomen and pelvis was performed following the standard protocol without IV contrast.  COMPARISON:  Prior radiograph from 06/24/2014  FINDINGS: CT CHEST FINDINGS  Large heterogeneous predominantly hypodense nodule measuring approximately 3.9 x 3.6 cm seen within the left thyroid lobe. The tracheal air column is bowed to the right at this level but remains widely patent.  No pathologically enlarged mediastinal, hilar, or axillary lymph nodes are identified.  Intrathoracic aorta is of normal caliber and appearance. Great vessels within normal limits. Minimal atherosclerotic plaque present at the origin of the left common carotid artery.  Heart size is normal. No pericardial effusion. Diffuse 3 vessel coronary artery calcifications present.  Pulmonary arterial tree is well opacified. No filling defect to suggest acute pulmonary embolism identified. Re-formatted imaging confirms these findings.  Subsegmental atelectasis seen dependently within the visualized lung bases. No focal infiltrates identified. No pulmonary edema or pleural effusion. No worrisome pulmonary nodule or mass. Mild upper lobe predominant centrilobular emphysema present.  No acute osseus abnormality. No worrisome lytic or blastic osseous lesions.  CT ABDOMEN AND PELVIS FINDINGS  The liver demonstrates a normal contrast enhanced appearance. Gallbladder within normal limits. The spleen and adrenal glands are unremarkable. The pancreas is within normal limits. Delete that  Scattered cyst noted within the bilateral kidneys. No nephrolithiasis, hydronephrosis, or focal enhancing renal mass.  Stomach within normal limits.  There are circumferential mucosal enhancement with wall thickening and inflammatory stranding about the second portion of the duodenum, suggesting acute to edematous. These inflammatory changes  closely approximates the pancreatic head, although the pancreas itself is favored to be within normal limits. No evidence of bowel obstruction. Appendix is normal. No other acute inflammatory changes seen about the bowels.  Fact containing paraumbilical hernia noted.  Bladder and prostate within normal limits.  No free air or fluid. No pathologically enlarged intra-abdominal pelvic lymph nodes. Moderate aorto bi-iliac atherosclerotic calcifications present without aneurysm.  No acute osseous abnormality. No worrisome lytic or blastic osseous lesion.  IMPRESSION: CT CHEST IMPRESSION:  1. No CT evidence of acute pulmonary embolism or other acute cardiopulmonary abnormality. 2. Mild upper lobe predominant centrilobular emphysema. 3. 3.9 x 3.6 cm left thyroid nodule. Further evaluation with dedicated thyroid ultrasound recommended. This could be performed on a nonemergent basis.  CT ABDOMEN AND PELVIS IMPRESSION:  1. Circumferential wall thickening with mucosal enhancement about the second portion of the duodenum, suggesting acute duodenitis. Inflammatory stranding within the adjacent peripancreatic fat is favored to be reactive in nature from the acute process in the duodenum, although possible acute pancreatitis not entirely excluded. Correlation with serum lipase recommended. 2. No other acute intra-abdominal or pelvic process.   Electronically Signed   By: Jeannine Boga M.D.   On:  06/24/2014 23:16    EKG: Independently reviewed. normal sinus rhythm, nonspecific ST and T waves changes. Assessment/Plan Principal Problem:   Duodenitis Active Problems:   Acute pancreatitis   Alcohol use   Smoker   1. Duodenitis Patient is presenting with complaints of epigastric pain. On further workup EKG and troponins are negative for any acute abnormality. A CT scan of the chest was also performed which was negative for any pulmonary embolism or pneumonia. CT of the abdomen was performed which was suggesting  possible duodenitis as well as possible pancreatitis. Patient's symptoms of nausea, sharp tearing pain radiating to his back and quite commonly associated with pancreatitis. Although his lipase levels are normal. With this the patient will be admitted in the hospital. I would treat him with IV pain medication, IV nausea medication for supportive management. IV fluids. Holding off on antibiotics at present.  2. Alcohol abuse. Patient denies any daily alcohol use that. Monitor for withdrawal.  3. Active smoker. Nicotine patch.  DVT Prophylaxis: subcutaneous Heparin Nutrition n.p.o.  Code Status: Full  Disposition: Admitted to inpatient in telemetry author: Berle Mull, MD [facility] Pager: [phone] 06/25/2014, 6:32 AM    If 7PM-7AM, please contact night-coverage [email or web address] Password [email or web address]

## 2014-06-25 NOTE — Progress Notes (Signed)
Patient Demographics  Dalton Arnold, is a 48 y.o. male, DOB - May 18, 1966, JGO:[id number]  Admit date - 06/24/2014   Admitting Physician No admitting provider for patient encounter.  Outpatient Primary MD for the patient is No PCP Per Patient  LOS - 1   Chief Complaint  Patient presents with  . Chest Pain        Subjective:   [place]en [place] today has, No headache, No chest pain, + abdominal pain - No Nausea, No new weakness tingling or numbness, No Cough - SOB.    Assessment & Plan    1. Abd Pain due to  Duodenitis - continue bowel rest, IV PPI, continue IV Cipro Flagyl, check for H. pylori stool antigen, will require GI followup in the outpatient setting, if not better will call GI in the inpatient setting.    2. Questionable thyroid mass on CT scan. Dedicated thyroid ultrasound ordered. Check TSH, free T3 and T4.    3. Nonspecific EKG changes. Check echogram to evaluate wall motion as coronary calcification is noted on CT angiogram chest, -ve 3 sets of cycled troponin. Is chest pain-free.     4. Occasional alcohol use and smoking. Counseled to quit. Nicotine patch.       Code Status: Full  Family Communication: none present  Disposition Plan: TBD   Procedures CTA Chest, CT Abd-Pelvis, Echo, Thyroid US   Consults    Medications  Scheduled Meds: . enoxaparin (LOVENOX) injection  40 mg Subcutaneous Q24H  . nicotine  14 mg Transdermal Daily  . pantoprazole (PROTONIX) IV  40 mg Intravenous Q12H   Continuous Infusions: . sodium chloride 100 mL/hr at 06/25/14 0600   PRN Meds:.acetaminophen, acetaminophen, morphine injection, ondansetron (ZOFRAN) IV, oxyCODONE  DVT Prophylaxis  Lovenox   Lab Results  Component Value Date   PLT 314 06/25/2014    Antibiotics      Anti-infectives   Start     Dose/Rate Route Frequency Ordered Stop   06/25/14 0145  ciprofloxacin (CIPRO) tablet 500 mg  Status:  Discontinued     500 mg Oral  Once 06/25/14 0132 06/25/14 0137   06/25/14 0145  metroNIDAZOLE (FLAGYL) tablet 500 mg  Status:  Discontinued     500 mg Oral  Once 06/25/14 0132 06/25/14 0137          Objective:   Filed Vitals:   06/24/14 2144 06/25/14 0151 06/25/14 0511 06/25/14 0750  BP: 136/91 143/89 137/90 137/93  Pulse: 71 62 64 69  Temp:   98.3 F (36.8 C) 98.1 F (36.7 C)  TempSrc:   Oral Oral  Resp: 18 18 18 16   SpO2: 95% 98% 94% 100%    Wt Readings from Last 3 Encounters:  No data found for Wt     Intake/Output Summary (Last 24 hours) at 06/25/14 1009 Last data filed at 06/25/14 0130  Gross per 24 hour  Intake    240 ml  Output      0 ml  Net    240 ml     Physical Exam  Awake Alert, Oriented X 3, No new F.N deficits, Normal affect [place].AT,PERRAL Supple Neck,No JVD, No cervical lymphadenopathy appriciated.  Symmetrical Chest wall movement, Good air movement bilaterally, CTAB RRR,No Gallops,Rubs or  new Murmurs, No Parasternal Heave +ve B.Sounds, Abd Soft, +ve mild epigastric tenderness, No organomegaly appriciated, No rebound - guarding or rigidity. No Cyanosis, Clubbing or edema, No new Rash or bruise     Data Review   Micro Results No results found for this or any previous visit (from the past 240 hour(s)).  Radiology Reports Dg Chest 2 View  06/24/2014   CLINICAL DATA:  Chest pain for 2 days  EXAM: CHEST  2 VIEW  COMPARISON:  04/28/2006  FINDINGS: Left lower lobe hazy airspace disease which may reflect atelectasis versus pneumonia. No other focal consolidation, pleural effusion or pneumothorax. The heart and mediastinal contours are unremarkable.  The osseous structures are unremarkable.  IMPRESSION: Hazy left lower lobe airspace disease concerning for atelectasis versus pneumonia.   Electronically Signed   By: Kathreen Devoid   On: 06/24/2014 19:24   Ct Angio Chest Pe W/cm &/or Wo Cm  06/24/2014   CLINICAL DATA:  Chest pain, abdominal pain, nausea  EXAM: CT CHEST, ABDOMEN AND PELVIS WITHOUT CONTRAST  TECHNIQUE: Multidetector CT imaging of the chest, abdomen and pelvis was performed following the standard protocol without IV contrast.  COMPARISON:  Prior radiograph from 06/24/2014  FINDINGS: CT CHEST FINDINGS  Large heterogeneous predominantly hypodense nodule measuring approximately 3.9 x 3.6 cm seen within the left thyroid lobe. The tracheal air column is bowed to the right at this level but remains widely patent.  No pathologically enlarged mediastinal, hilar, or axillary lymph nodes are identified.  Intrathoracic aorta is of normal caliber and appearance. Great vessels within normal limits. Minimal atherosclerotic plaque present at the origin of the left common carotid artery.  Heart size is normal. No pericardial effusion. Diffuse 3 vessel coronary artery calcifications present.  Pulmonary arterial tree is well opacified. No filling defect to suggest acute pulmonary embolism identified. Re-formatted imaging confirms these findings.  Subsegmental atelectasis seen dependently within the visualized lung bases. No focal infiltrates identified. No pulmonary edema or pleural effusion. No worrisome pulmonary nodule or mass. Mild upper lobe predominant centrilobular emphysema present.  No acute osseus abnormality. No worrisome lytic or blastic osseous lesions.  CT ABDOMEN AND PELVIS FINDINGS  The liver demonstrates a normal contrast enhanced appearance. Gallbladder within normal limits. The spleen and adrenal glands are unremarkable. The pancreas is within normal limits. Delete that  Scattered cyst noted within the bilateral kidneys. No nephrolithiasis, hydronephrosis, or focal enhancing renal mass.  Stomach within normal limits.  There are circumferential mucosal enhancement with wall thickening and inflammatory stranding about the  second portion of the duodenum, suggesting acute to edematous. These inflammatory changes closely approximates the pancreatic head, although the pancreas itself is favored to be within normal limits. No evidence of bowel obstruction. Appendix is normal. No other acute inflammatory changes seen about the bowels.  Fact containing paraumbilical hernia noted.  Bladder and prostate within normal limits.  No free air or fluid. No pathologically enlarged intra-abdominal pelvic lymph nodes. Moderate aorto bi-iliac atherosclerotic calcifications present without aneurysm.  No acute osseous abnormality. No worrisome lytic or blastic osseous lesion.  IMPRESSION: CT CHEST IMPRESSION:  1. No CT evidence of acute pulmonary embolism or other acute cardiopulmonary abnormality. 2. Mild upper lobe predominant centrilobular emphysema. 3. 3.9 x 3.6 cm left thyroid nodule. Further evaluation with dedicated thyroid ultrasound recommended. This could be performed on a nonemergent basis.  CT ABDOMEN AND PELVIS IMPRESSION:  1. Circumferential wall thickening with mucosal enhancement about the second portion of the duodenum, suggesting acute duodenitis.  Inflammatory stranding within the adjacent peripancreatic fat is favored to be reactive in nature from the acute process in the duodenum, although possible acute pancreatitis not entirely excluded. Correlation with serum lipase recommended. 2. No other acute intra-abdominal or pelvic process.   Electronically Signed   By: Jeannine Boga M.D.   On: 06/24/2014 23:16   Ct Abdomen Pelvis W Contrast  06/24/2014   CLINICAL DATA:  Chest pain, abdominal pain, nausea  EXAM: CT CHEST, ABDOMEN AND PELVIS WITHOUT CONTRAST  TECHNIQUE: Multidetector CT imaging of the chest, abdomen and pelvis was performed following the standard protocol without IV contrast.  COMPARISON:  Prior radiograph from 06/24/2014  FINDINGS: CT CHEST FINDINGS  Large heterogeneous predominantly hypodense nodule measuring  approximately 3.9 x 3.6 cm seen within the left thyroid lobe. The tracheal air column is bowed to the right at this level but remains widely patent.  No pathologically enlarged mediastinal, hilar, or axillary lymph nodes are identified.  Intrathoracic aorta is of normal caliber and appearance. Great vessels within normal limits. Minimal atherosclerotic plaque present at the origin of the left common carotid artery.  Heart size is normal. No pericardial effusion. Diffuse 3 vessel coronary artery calcifications present.  Pulmonary arterial tree is well opacified. No filling defect to suggest acute pulmonary embolism identified. Re-formatted imaging confirms these findings.  Subsegmental atelectasis seen dependently within the visualized lung bases. No focal infiltrates identified. No pulmonary edema or pleural effusion. No worrisome pulmonary nodule or mass. Mild upper lobe predominant centrilobular emphysema present.  No acute osseus abnormality. No worrisome lytic or blastic osseous lesions.      CT ABDOMEN AND PELVIS FINDINGS  The liver demonstrates a normal contrast enhanced appearance. Gallbladder within normal limits. The spleen and adrenal glands are unremarkable. The pancreas is within normal limits. Delete that  Scattered cyst noted within the bilateral kidneys. No nephrolithiasis, hydronephrosis, or focal enhancing renal mass.  Stomach within normal limits.  There are circumferential mucosal enhancement with wall thickening and inflammatory stranding about the second portion of the duodenum, suggesting acute to edematous. These inflammatory changes closely approximates the pancreatic head, although the pancreas itself is favored to be within normal limits. No evidence of bowel obstruction. Appendix is normal. No other acute inflammatory changes seen about the bowels.  Fact containing paraumbilical hernia noted.  Bladder and prostate within normal limits.  No free air or fluid. No pathologically enlarged  intra-abdominal pelvic lymph nodes. Moderate aorto bi-iliac atherosclerotic calcifications present without aneurysm.  No acute osseous abnormality. No worrisome lytic or blastic osseous lesion.  IMPRESSION: CT CHEST IMPRESSION:  1. No CT evidence of acute pulmonary embolism or other acute cardiopulmonary abnormality. 2. Mild upper lobe predominant centrilobular emphysema. 3. 3.9 x 3.6 cm left thyroid nodule. Further evaluation with dedicated thyroid ultrasound recommended. This could be performed on a nonemergent basis.  CT ABDOMEN AND PELVIS IMPRESSION:  1. Circumferential wall thickening with mucosal enhancement about the second portion of the duodenum, suggesting acute duodenitis. Inflammatory stranding within the adjacent peripancreatic fat is favored to be reactive in nature from the acute process in the duodenum, although possible acute pancreatitis not entirely excluded. Correlation with serum lipase recommended. 2. No other acute intra-abdominal or pelvic process.   Electronically Signed   By: Jeannine Boga M.D.   On: 06/24/2014 23:16     CBC  Recent Labs Lab 06/24/14 1829 06/25/14 0457  WBC 12.2* 11.9*  HGB 15.2 14.4  HCT 44.7 43.4  PLT 337 314  MCV 78.6 79.3  MCH 26.7 26.3  MCHC 34.0 33.2  RDW 14.5 14.6  LYMPHSABS 3.9 3.8  MONOABS 0.7 0.8  EOSABS 0.3 0.3  BASOSABS 0.0 0.0    Chemistries   Recent Labs Lab 06/24/14 1829 06/25/14 0457  NA 138 142  K 4.0 3.8  CL 101 107  CO2 23 25  GLUCOSE 83 100*  BUN 10 8  CREATININE 0.72 0.71  CALCIUM 9.4 8.7  AST 18 12  ALT 17 14  ALKPHOS 77 68  BILITOT 0.2* 0.4   ------------------------------------------------------------------------------------------------------------------ CrCl is unknown because there is no height on file for the current visit. ------------------------------------------------------------------------------------------------------------------ No results found for this basename: HGBA1C,  in the last 72  hours ------------------------------------------------------------------------------------------------------------------ No results found for this basename: CHOL, HDL, LDLCALC, TRIG, CHOLHDL, LDLDIRECT,  in the last 72 hours ------------------------------------------------------------------------------------------------------------------ No results found for this basename: TSH, T4TOTAL, FREET3, T3FREE, THYROIDAB,  in the last 72 hours ------------------------------------------------------------------------------------------------------------------ No results found for this basename: VITAMINB12, FOLATE, FERRITIN, TIBC, IRON, RETICCTPCT,  in the last 72 hours  Coagulation profile  Recent Labs Lab 06/25/14 0457  INR 1.04    No results found for this basename: DDIMER,  in the last 72 hours  Cardiac Enzymes  Recent Labs Lab 06/24/14 1829 06/24/14 2225 06/25/14 0457  TROPONINI <0.30 <0.30 <0.30   ------------------------------------------------------------------------------------------------------------------ No components found with this basename: POCBNP,      Time Spent in minutes  35   Sriyan Cutting K M.D on 06/25/2014 at 10:09 AM  Between 7am to 7pm - Pager - [phone]  After 7pm go to [email or web address] - password [email or web address]  And look for the night coverage person covering for me after hours  [facility] Office  [phone]   **Disclaimer: This note may have been dictated with voice recognition software. Similar sounding words can inadvertently be transcribed and this note may contain transcription errors which may not have been corrected upon publication of note.**

## 2014-06-25 NOTE — ED Notes (Addendum)
I have considered phoning report earlier, however, u/s was performed on thyroid and shortly thereafter, the ultrasonographer arrives to do abd. U/S which I allow them to complete.  I have just phoned report to Sharon, RN on [place] and will transport shortly.  He remains in no distress.  He c/o h/a, however, I defer pain med until he is settled into bed upstairs--Clarissa aware--pt. And his significant other are ok with this plan.

## 2014-06-26 LAB — COMPREHENSIVE METABOLIC PANEL
ALBUMIN: 3.5 g/dL (ref 3.5–5.2)
ALT: 12 U/L (ref 0–53)
AST: 12 U/L (ref 0–37)
Alkaline Phosphatase: 72 U/L (ref 39–117)
Anion gap: 13 (ref 5–15)
BILIRUBIN TOTAL: 0.6 mg/dL (ref 0.3–1.2)
BUN: 9 mg/dL (ref 6–23)
CHLORIDE: 106 meq/L (ref 96–112)
CO2: 25 mEq/L (ref 19–32)
Calcium: 9.2 mg/dL (ref 8.4–10.5)
Creatinine, Ser: 0.96 mg/dL (ref 0.50–1.35)
GFR calc Af Amer: >90 mL/min (ref >90)
GFR calc non Af Amer: >90 mL/min (ref >90)
Glucose, Bld: 78 mg/dL (ref 70–99)
POTASSIUM: 4.1 meq/L (ref 3.7–5.3)
SODIUM: 144 meq/L (ref 137–147)
Total Protein: 6.6 g/dL (ref 6.0–8.3)

## 2014-06-26 LAB — CBC
HCT: 44.5 % (ref 39.0–52.0)
Hemoglobin: 14.6 g/dL (ref 13.0–17.0)
MCH: 26.1 pg (ref 26.0–34.0)
MCHC: 32.8 g/dL (ref 30.0–36.0)
MCV: 79.6 fL (ref 78.0–100.0)
PLATELETS: 315 10*3/uL (ref 150–400)
RBC: 5.59 MIL/uL (ref 4.22–5.81)
RDW: 14.5 % (ref 11.5–15.5)
WBC: 11.1 10*3/uL — AB (ref 4.0–10.5)

## 2014-06-26 LAB — LIPASE, BLOOD: LIPASE: 19 U/L (ref 11–59)

## 2014-06-26 MED ORDER — ENOXAPARIN SODIUM 40 MG/0.4ML ~~LOC~~ SOLN
40.0000 mg | SUBCUTANEOUS | Status: DC
  Administered 2014-06-28: 40 mg via SUBCUTANEOUS
  Filled 2014-06-26: qty 0.4

## 2014-06-26 NOTE — Consult Note (Signed)
Unassigned Consult  Reason for Consult: Epigastric pain Referring Physician: [facility]  Dalton Arnold HPI: This is a 48 year old male with a PMH of bilateral inguinal hernia repair admitted for an acute onset of epigastric pain.  His pain started this past Sunday and it started with PO intake.  His pain initially remitted, but then it worsened again and continued to persist.  No reports of nausea, vomiting, diarrhea, or constipation.  He denies any sick contacts or any significant use of NSAIDs before the onset of his symptoms.  No known history of PUD.  The patient does drink ETOH, but I cannot discern the exact amount as he does not articulate his words.  A CT scan of the ABM was performed and there appears to be a duodenitis.  No evidence of pancreatitis with a normal lipase at 19.  History reviewed. No pertinent past medical history.  Past Surgical History  Procedure Laterality Date  . Left inguinal hernia repair with mesh  05/01/2006  . Hernia repair    . Nasal fracture surgery      History reviewed. No pertinent family history.  Social History:  reports that he has been smoking Cigarettes.  He has been smoking about 1.00 pack per day. He has never used smokeless tobacco. He reports that he drinks alcohol. He reports that he does not use illicit drugs.  Allergies: No Known Allergies  Medications:  Scheduled: . [START ON 06/28/2014] enoxaparin (LOVENOX) injection  40 mg Subcutaneous Q24H  . nicotine  14 mg Transdermal Daily  . pantoprazole (PROTONIX) IV  40 mg Intravenous Q12H   Continuous: . 0.9 % NaCl with KCl 20 mEq / L 75 mL/hr (06/26/14 1247)    Results for orders placed during the hospital encounter of 06/24/14 (from the past 24 hour(s))  CBC     Status: Abnormal   Collection Time    06/26/14  5:47 AM      Result Value Ref Range   WBC 11.1 (*) 4.0 - 10.5 K/uL   RBC 5.59  4.22 - 5.81 MIL/uL   Hemoglobin 14.6  13.0 - 17.0 g/dL   HCT 44.5  39.0 - 52.0 %    MCV 79.6  78.0 - 100.0 fL   MCH 26.1  26.0 - 34.0 pg   MCHC 32.8  30.0 - 36.0 g/dL   RDW 14.5  11.5 - 15.5 %   Platelets 315  150 - 400 K/uL  COMPREHENSIVE METABOLIC PANEL     Status: None   Collection Time    06/26/14  5:47 AM      Result Value Ref Range   Sodium 144  137 - 147 mEq/L   Potassium 4.1  3.7 - 5.3 mEq/L   Chloride 106  96 - 112 mEq/L   CO2 25  19 - 32 mEq/L   Glucose, Bld 78  70 - 99 mg/dL   BUN 9  6 - 23 mg/dL   Creatinine, Ser 0.96  0.50 - 1.35 mg/dL   Calcium 9.2  8.4 - 10.5 mg/dL   Total Protein 6.6  6.0 - 8.3 g/dL   Albumin 3.5  3.5 - 5.2 g/dL   AST 12  0 - 37 U/L   ALT 12  0 - 53 U/L   Alkaline Phosphatase 72  39 - 117 U/L   Total Bilirubin 0.6  0.3 - 1.2 mg/dL   GFR calc non Af Amer >90  >90 mL/min   GFR calc Af Amer >90  >  90 mL/min   Anion gap 13  5 - 15  LIPASE, BLOOD     Status: None   Collection Time    06/26/14  5:47 AM      Result Value Ref Range   Lipase 19  11 - 59 U/L     Dg Chest 2 View  06/24/2014   CLINICAL DATA:  Chest pain for 2 days  EXAM: CHEST  2 VIEW  COMPARISON:  04/28/2006  FINDINGS: Left lower lobe hazy airspace disease which may reflect atelectasis versus pneumonia. No other focal consolidation, pleural effusion or pneumothorax. The heart and mediastinal contours are unremarkable.  The osseous structures are unremarkable.  IMPRESSION: Hazy left lower lobe airspace disease concerning for atelectasis versus pneumonia.   Electronically Signed   By: Kathreen Devoid   On: 06/24/2014 19:24   Ct Angio Chest Pe W/cm &/or Wo Cm  06/24/2014   CLINICAL DATA:  Chest pain, abdominal pain, nausea  EXAM: CT CHEST, ABDOMEN AND PELVIS WITHOUT CONTRAST  TECHNIQUE: Multidetector CT imaging of the chest, abdomen and pelvis was performed following the standard protocol without IV contrast.  COMPARISON:  Prior radiograph from 06/24/2014  FINDINGS: CT CHEST FINDINGS  Large heterogeneous predominantly hypodense nodule measuring approximately 3.9 x 3.6 cm seen  within the left thyroid lobe. The tracheal air column is bowed to the right at this level but remains widely patent.  No pathologically enlarged mediastinal, hilar, or axillary lymph nodes are identified.  Intrathoracic aorta is of normal caliber and appearance. Great vessels within normal limits. Minimal atherosclerotic plaque present at the origin of the left common carotid artery.  Heart size is normal. No pericardial effusion. Diffuse 3 vessel coronary artery calcifications present.  Pulmonary arterial tree is well opacified. No filling defect to suggest acute pulmonary embolism identified. Re-formatted imaging confirms these findings.  Subsegmental atelectasis seen dependently within the visualized lung bases. No focal infiltrates identified. No pulmonary edema or pleural effusion. No worrisome pulmonary nodule or mass. Mild upper lobe predominant centrilobular emphysema present.  No acute osseus abnormality. No worrisome lytic or blastic osseous lesions.  CT ABDOMEN AND PELVIS FINDINGS  The liver demonstrates a normal contrast enhanced appearance. Gallbladder within normal limits. The spleen and adrenal glands are unremarkable. The pancreas is within normal limits. Delete that  Scattered cyst noted within the bilateral kidneys. No nephrolithiasis, hydronephrosis, or focal enhancing renal mass.  Stomach within normal limits.  There are circumferential mucosal enhancement with wall thickening and inflammatory stranding about the second portion of the duodenum, suggesting acute to edematous. These inflammatory changes closely approximates the pancreatic head, although the pancreas itself is favored to be within normal limits. No evidence of bowel obstruction. Appendix is normal. No other acute inflammatory changes seen about the bowels.  Fact containing paraumbilical hernia noted.  Bladder and prostate within normal limits.  No free air or fluid. No pathologically enlarged intra-abdominal pelvic lymph nodes.  Moderate aorto bi-iliac atherosclerotic calcifications present without aneurysm.  No acute osseous abnormality. No worrisome lytic or blastic osseous lesion.  IMPRESSION: CT CHEST IMPRESSION:  1. No CT evidence of acute pulmonary embolism or other acute cardiopulmonary abnormality. 2. Mild upper lobe predominant centrilobular emphysema. 3. 3.9 x 3.6 cm left thyroid nodule. Further evaluation with dedicated thyroid ultrasound recommended. This could be performed on a nonemergent basis.  CT ABDOMEN AND PELVIS IMPRESSION:  1. Circumferential wall thickening with mucosal enhancement about the second portion of the duodenum, suggesting acute duodenitis. Inflammatory stranding within the adjacent peripancreatic  fat is favored to be reactive in nature from the acute process in the duodenum, although possible acute pancreatitis not entirely excluded. Correlation with serum lipase recommended. 2. No other acute intra-abdominal or pelvic process.   Electronically Signed   By: Jeannine Boga M.D.   On: 06/24/2014 23:16   US Soft Tissue Head/neck  06/25/2014   CLINICAL DATA:  Abnormal CT chest, LEFT thyroid nodule  EXAM: THYROID ULTRASOUND  TECHNIQUE: Ultrasound examination of the thyroid gland and adjacent soft tissues was performed.  COMPARISON:  CT chest 06/24/2014  FINDINGS: Right thyroid lobe  Measurements: 6.4 x 2.0 x 2.0 cm. Homogeneous echogenicity. 4 mm calcification with shadowing at upper pole. Small hypoechoic nodules 6 x 3 x 5 mm at mid RIGHT lobe. No additional mass lesions.  Left thyroid lobe  Measurements: 7.9 x 3.6 x 3.7 cm. Normal parenchymal echogenicity. Solid isoechoic nodule at upper pole 2.6 x 1.3 x 2.3 cm. Dominant mass, heterogeneous isoechoic to hypoechoic, at inferior pole extending retroclavicular, measuring approximately 3.9 x 3.3 x 3.8 cm. This lesion corresponds to the CT finding.  Isthmus  Thickness: 5 mm thick.  No nodules visualized.  Lymphadenopathy  Normal-size lymph nodes noted  bilaterally in cervical region.  IMPRESSION: Nonspecific 4 mm calcification RIGHT thyroid lobe.  Two solid masses within LEFT thyroid lobe, larger at lower pole measuring 3.9 x 3.3 x 3.8 cm and smaller at upper pole measuring 2.6 x 1.3 x 2.3 cm.  Findings meet consensus criteria for biopsy. Ultrasound-guided fine needle aspiration should be considered, as per the consensus statement: Management of Thyroid Nodules Detected at [place]: Society of Radiologists in [place]. Radiology 2005; [id number].   Electronically Signed   By: Lavonia Dana M.D.   On: 06/25/2014 11:27   Ct Abdomen Pelvis W Contrast  06/24/2014   CLINICAL DATA:  Chest pain, abdominal pain, nausea  EXAM: CT CHEST, ABDOMEN AND PELVIS WITHOUT CONTRAST  TECHNIQUE: Multidetector CT imaging of the chest, abdomen and pelvis was performed following the standard protocol without IV contrast.  COMPARISON:  Prior radiograph from 06/24/2014  FINDINGS: CT CHEST FINDINGS  Large heterogeneous predominantly hypodense nodule measuring approximately 3.9 x 3.6 cm seen within the left thyroid lobe. The tracheal air column is bowed to the right at this level but remains widely patent.  No pathologically enlarged mediastinal, hilar, or axillary lymph nodes are identified.  Intrathoracic aorta is of normal caliber and appearance. Great vessels within normal limits. Minimal atherosclerotic plaque present at the origin of the left common carotid artery.  Heart size is normal. No pericardial effusion. Diffuse 3 vessel coronary artery calcifications present.  Pulmonary arterial tree is well opacified. No filling defect to suggest acute pulmonary embolism identified. Re-formatted imaging confirms these findings.  Subsegmental atelectasis seen dependently within the visualized lung bases. No focal infiltrates identified. No pulmonary edema or pleural effusion. No worrisome pulmonary nodule or mass. Mild upper lobe predominant centrilobular emphysema  present.  No acute osseus abnormality. No worrisome lytic or blastic osseous lesions.  CT ABDOMEN AND PELVIS FINDINGS  The liver demonstrates a normal contrast enhanced appearance. Gallbladder within normal limits. The spleen and adrenal glands are unremarkable. The pancreas is within normal limits. Delete that  Scattered cyst noted within the bilateral kidneys. No nephrolithiasis, hydronephrosis, or focal enhancing renal mass.  Stomach within normal limits.  There are circumferential mucosal enhancement with wall thickening and inflammatory stranding about the second portion of the duodenum, suggesting acute to edematous. These inflammatory changes closely  approximates the pancreatic head, although the pancreas itself is favored to be within normal limits. No evidence of bowel obstruction. Appendix is normal. No other acute inflammatory changes seen about the bowels.  Fact containing paraumbilical hernia noted.  Bladder and prostate within normal limits.  No free air or fluid. No pathologically enlarged intra-abdominal pelvic lymph nodes. Moderate aorto bi-iliac atherosclerotic calcifications present without aneurysm.  No acute osseous abnormality. No worrisome lytic or blastic osseous lesion.  IMPRESSION: CT CHEST IMPRESSION:  1. No CT evidence of acute pulmonary embolism or other acute cardiopulmonary abnormality. 2. Mild upper lobe predominant centrilobular emphysema. 3. 3.9 x 3.6 cm left thyroid nodule. Further evaluation with dedicated thyroid ultrasound recommended. This could be performed on a nonemergent basis.  CT ABDOMEN AND PELVIS IMPRESSION:  1. Circumferential wall thickening with mucosal enhancement about the second portion of the duodenum, suggesting acute duodenitis. Inflammatory stranding within the adjacent peripancreatic fat is favored to be reactive in nature from the acute process in the duodenum, although possible acute pancreatitis not entirely excluded. Correlation with serum lipase  recommended. 2. No other acute intra-abdominal or pelvic process.   Electronically Signed   By: Jeannine Boga M.D.   On: 06/24/2014 23:16    ROS:  As stated above in the HPI otherwise negative.  Blood pressure 146/89, pulse 83, temperature 98.2 F (36.8 C), temperature source Oral, resp. rate 20, height 5\' 7"  (1.702 m), weight 86.9 kg (191 lb 9.3 oz), SpO2 98.00%.    PE: Gen: NAD, Alert and Oriented HEENT:  [place]/AT, EOMI Neck: Supple, no LAD Lungs: CTA Bilaterally CV: RRR without M/G/R ABM: Soft, epigastric tenderness, +BS Ext: No C/C/E  Assessment/Plan: 1) Duodenitis. 2) Epigastric pain. 3) Small umbilical hernia.  Plan: 1) EGD tomorrow.  Zipporah Finamore D 06/26/2014, 3:47 PM

## 2014-06-26 NOTE — ED Provider Notes (Signed)
Medical screening examination/treatment/procedure(s) were performed by non-physician practitioner and as supervising physician I was immediately available for consultation/collaboration.   EKG Interpretation   Date/Time:  Tuesday June 24 2014 17:50:58 EDT Ventricular Rate:  70 PR Interval:  160 QRS Duration: 104 QT Interval:  404 QTC Calculation: 436 R Axis:   73 Text Interpretation:  Sinus rhythm ST elev, probable normal early repol  pattern ED PHYSICIAN INTERPRETATION AVAILABLE IN [facility] Confirmed  by TEST, Record ([id number]) on 06/26/2014 6:55:22 AM       Jasper Riling. Alvino Chapel, MD 06/26/14 [phone]

## 2014-06-26 NOTE — Progress Notes (Addendum)
Patient Demographics  Dalton Arnold, is a 48 y.o. male, DOB - Oct 24, 1965, GDJ:[id number]  Admit date - 06/24/2014   Admitting Physician Berle Mull, MD  Outpatient Primary MD for the patient is No PCP Per Patient  LOS - 2   Chief Complaint  Patient presents with  . Chest Pain        Subjective:   Dmarius XXXMachalany today has, No headache, No chest pain, + abdominal pain but much improved - No Nausea, No new weakness tingling or numbness, No Cough - SOB.    Assessment & Plan    1. Abd Pain due to  Duodenitis - much improved after IV PPI, continue IV Cipro Flagyl, check for H. pylori stool antigen, called GI as patient wants EGD inpatient, on clears.    2. Questionable thyroid mass on CT scan. Thyroid ultrasound notes mass as well, TSH mildly suppressed with normal free T3 and T4. FNAC ordered. Will have him follow with ENT upon discharge.    3. Nonspecific EKG changes. No chest pain this admission, -3 sets troponin, echogram shows preserved EF of 60% with no more motion abnormality. Chronic LVH. Currently compensated.   4. Occasional alcohol use and smoking. Counseled to quit. Nicotine patch.       Code Status: Full  Family Communication: none present  Disposition Plan: TBD   Procedures CTA Chest, CT Abd-Pelvis, Echo, Thyroid US, thyroid biopsy ordered   Consults  GI-[facility]   Medications  Scheduled Meds: . enoxaparin (LOVENOX) injection  40 mg Subcutaneous Q24H  . nicotine  14 mg Transdermal Daily  . pantoprazole (PROTONIX) IV  40 mg Intravenous Q12H   Continuous Infusions: . 0.9 % NaCl with KCl 20 mEq / L 75 mL/hr at 06/25/14 2326   PRN Meds:.acetaminophen, acetaminophen, morphine injection, ondansetron (ZOFRAN) IV, oxyCODONE  DVT Prophylaxis  Lovenox   Lab  Results  Component Value Date   PLT 315 06/26/2014    Antibiotics     Anti-infectives   Start     Dose/Rate Route Frequency Ordered Stop   06/25/14 0145  ciprofloxacin (CIPRO) tablet 500 mg  Status:  Discontinued     500 mg Oral  Once 06/25/14 0132 06/25/14 0137   06/25/14 0145  metroNIDAZOLE (FLAGYL) tablet 500 mg  Status:  Discontinued     500 mg Oral  Once 06/25/14 0132 06/25/14 0137          Objective:   Filed Vitals:   06/25/14 1700 06/25/14 2235 06/26/14 0500 06/26/14 0615  BP:  139/86  137/70  Pulse:  77  72  Temp:  98.9 F (37.2 C)  97.8 F (36.6 C)  TempSrc:  Oral  Oral  Resp:  20  20  Height: 5\' 7"  (1.702 m)     Weight: 87.7 kg (193 lb 5.5 oz)  86.9 kg (191 lb 9.3 oz)   SpO2:  94%  96%    Wt Readings from Last 3 Encounters:  06/26/14 86.9 kg (191 lb 9.3 oz)     Intake/Output Summary (Last 24 hours) at 06/26/14 1146 Last data filed at 06/26/14 0957  Gross per 24 hour  Intake 1813.75 ml  Output   1400 ml  Net 413.75 ml     Physical Exam  Awake Alert, Oriented X 3, No new F.N deficits, Normal affect [place].AT,PERRAL Supple Neck,No JVD, No cervical lymphadenopathy appriciated.  Symmetrical Chest wall movement, Good air movement bilaterally, CTAB RRR,No Gallops,Rubs or new Murmurs, No Parasternal Heave +ve B.Sounds, Abd Soft, +ve mild epigastric tenderness, No organomegaly appriciated, No rebound - guarding or rigidity. No Cyanosis, Clubbing or edema, No new Rash or bruise     Data Review   Micro Results No results found for this or any previous visit (from the past 240 hour(s)).  Radiology Reports Dg Chest 2 View  06/24/2014   CLINICAL DATA:  Chest pain for 2 days  EXAM: CHEST  2 VIEW  COMPARISON:  04/28/2006  FINDINGS: Left lower lobe hazy airspace disease which may reflect atelectasis versus pneumonia. No other focal consolidation, pleural effusion or pneumothorax. The heart and mediastinal contours are unremarkable.  The osseous structures are  unremarkable.  IMPRESSION: Hazy left lower lobe airspace disease concerning for atelectasis versus pneumonia.   Electronically Signed   By: Kathreen Devoid   On: 06/24/2014 19:24   Ct Angio Chest Pe W/cm &/or Wo Cm  06/24/2014   CLINICAL DATA:  Chest pain, abdominal pain, nausea  EXAM: CT CHEST, ABDOMEN AND PELVIS WITHOUT CONTRAST  TECHNIQUE: Multidetector CT imaging of the chest, abdomen and pelvis was performed following the standard protocol without IV contrast.  COMPARISON:  Prior radiograph from 06/24/2014  FINDINGS: CT CHEST FINDINGS  Large heterogeneous predominantly hypodense nodule measuring approximately 3.9 x 3.6 cm seen within the left thyroid lobe. The tracheal air column is bowed to the right at this level but remains widely patent.  No pathologically enlarged mediastinal, hilar, or axillary lymph nodes are identified.  Intrathoracic aorta is of normal caliber and appearance. Great vessels within normal limits. Minimal atherosclerotic plaque present at the origin of the left common carotid artery.  Heart size is normal. No pericardial effusion. Diffuse 3 vessel coronary artery calcifications present.  Pulmonary arterial tree is well opacified. No filling defect to suggest acute pulmonary embolism identified. Re-formatted imaging confirms these findings.  Subsegmental atelectasis seen dependently within the visualized lung bases. No focal infiltrates identified. No pulmonary edema or pleural effusion. No worrisome pulmonary nodule or mass. Mild upper lobe predominant centrilobular emphysema present.  No acute osseus abnormality. No worrisome lytic or blastic osseous lesions.  CT ABDOMEN AND PELVIS FINDINGS  The liver demonstrates a normal contrast enhanced appearance. Gallbladder within normal limits. The spleen and adrenal glands are unremarkable. The pancreas is within normal limits. Delete that  Scattered cyst noted within the bilateral kidneys. No nephrolithiasis, hydronephrosis, or focal enhancing  renal mass.  Stomach within normal limits.  There are circumferential mucosal enhancement with wall thickening and inflammatory stranding about the second portion of the duodenum, suggesting acute to edematous. These inflammatory changes closely approximates the pancreatic head, although the pancreas itself is favored to be within normal limits. No evidence of bowel obstruction. Appendix is normal. No other acute inflammatory changes seen about the bowels.  Fact containing paraumbilical hernia noted.  Bladder and prostate within normal limits.  No free air or fluid. No pathologically enlarged intra-abdominal pelvic lymph nodes. Moderate aorto bi-iliac atherosclerotic calcifications present without aneurysm.  No acute osseous abnormality. No worrisome lytic or blastic osseous lesion.  IMPRESSION: CT CHEST IMPRESSION:  1. No CT evidence of acute pulmonary embolism or other acute cardiopulmonary abnormality. 2. Mild upper lobe predominant centrilobular emphysema. 3. 3.9 x 3.6 cm left thyroid nodule. Further evaluation with dedicated thyroid ultrasound recommended.  This could be performed on a nonemergent basis.  CT ABDOMEN AND PELVIS IMPRESSION:  1. Circumferential wall thickening with mucosal enhancement about the second portion of the duodenum, suggesting acute duodenitis. Inflammatory stranding within the adjacent peripancreatic fat is favored to be reactive in nature from the acute process in the duodenum, although possible acute pancreatitis not entirely excluded. Correlation with serum lipase recommended. 2. No other acute intra-abdominal or pelvic process.   Electronically Signed   By: Jeannine Boga M.D.   On: 06/24/2014 23:16   Ct Abdomen Pelvis W Contrast  06/24/2014   CLINICAL DATA:  Chest pain, abdominal pain, nausea  EXAM: CT CHEST, ABDOMEN AND PELVIS WITHOUT CONTRAST  TECHNIQUE: Multidetector CT imaging of the chest, abdomen and pelvis was performed following the standard protocol without IV  contrast.  COMPARISON:  Prior radiograph from 06/24/2014  FINDINGS: CT CHEST FINDINGS  Large heterogeneous predominantly hypodense nodule measuring approximately 3.9 x 3.6 cm seen within the left thyroid lobe. The tracheal air column is bowed to the right at this level but remains widely patent.  No pathologically enlarged mediastinal, hilar, or axillary lymph nodes are identified.  Intrathoracic aorta is of normal caliber and appearance. Great vessels within normal limits. Minimal atherosclerotic plaque present at the origin of the left common carotid artery.  Heart size is normal. No pericardial effusion. Diffuse 3 vessel coronary artery calcifications present.  Pulmonary arterial tree is well opacified. No filling defect to suggest acute pulmonary embolism identified. Re-formatted imaging confirms these findings.  Subsegmental atelectasis seen dependently within the visualized lung bases. No focal infiltrates identified. No pulmonary edema or pleural effusion. No worrisome pulmonary nodule or mass. Mild upper lobe predominant centrilobular emphysema present.  No acute osseus abnormality. No worrisome lytic or blastic osseous lesions.      CT ABDOMEN AND PELVIS FINDINGS  The liver demonstrates a normal contrast enhanced appearance. Gallbladder within normal limits. The spleen and adrenal glands are unremarkable. The pancreas is within normal limits. Delete that  Scattered cyst noted within the bilateral kidneys. No nephrolithiasis, hydronephrosis, or focal enhancing renal mass.  Stomach within normal limits.  There are circumferential mucosal enhancement with wall thickening and inflammatory stranding about the second portion of the duodenum, suggesting acute to edematous. These inflammatory changes closely approximates the pancreatic head, although the pancreas itself is favored to be within normal limits. No evidence of bowel obstruction. Appendix is normal. No other acute inflammatory changes seen about the  bowels.  Fact containing paraumbilical hernia noted.  Bladder and prostate within normal limits.  No free air or fluid. No pathologically enlarged intra-abdominal pelvic lymph nodes. Moderate aorto bi-iliac atherosclerotic calcifications present without aneurysm.  No acute osseous abnormality. No worrisome lytic or blastic osseous lesion.  IMPRESSION: CT CHEST IMPRESSION:  1. No CT evidence of acute pulmonary embolism or other acute cardiopulmonary abnormality. 2. Mild upper lobe predominant centrilobular emphysema. 3. 3.9 x 3.6 cm left thyroid nodule. Further evaluation with dedicated thyroid ultrasound recommended. This could be performed on a nonemergent basis.  CT ABDOMEN AND PELVIS IMPRESSION:  1. Circumferential wall thickening with mucosal enhancement about the second portion of the duodenum, suggesting acute duodenitis. Inflammatory stranding within the adjacent peripancreatic fat is favored to be reactive in nature from the acute process in the duodenum, although possible acute pancreatitis not entirely excluded. Correlation with serum lipase recommended. 2. No other acute intra-abdominal or pelvic process.   Electronically Signed   By: Jeannine Boga M.D.   On: 06/24/2014 23:16  CBC  Recent Labs Lab 06/24/14 1829 06/25/14 0457 06/26/14 0547  WBC 12.2* 11.9* 11.1*  HGB 15.2 14.4 14.6  HCT 44.7 43.4 44.5  PLT 337 314 315  MCV 78.6 79.3 79.6  MCH 26.7 26.3 26.1  MCHC 34.0 33.2 32.8  RDW 14.5 14.6 14.5  LYMPHSABS 3.9 3.8  --   MONOABS 0.7 0.8  --   EOSABS 0.3 0.3  --   BASOSABS 0.0 0.0  --     Chemistries   Recent Labs Lab 06/24/14 1829 06/25/14 0457 06/26/14 0547  NA 138 142 144  K 4.0 3.8 4.1  CL 101 107 106  CO2 23 25 25   GLUCOSE 83 100* 78  BUN 10 8 9   CREATININE 0.72 0.71 0.96  CALCIUM 9.4 8.7 9.2  AST 18 12 12   ALT 17 14 12   ALKPHOS 77 68 72  BILITOT 0.2* 0.4 0.6    ------------------------------------------------------------------------------------------------------------------ estimated creatinine clearance is 96.9 ml/min (by C-G formula based on Cr of 0.96). ------------------------------------------------------------------------------------------------------------------ No results found for this basename: HGBA1C,  in the last 72 hours ------------------------------------------------------------------------------------------------------------------ No results found for this basename: CHOL, HDL, LDLCALC, TRIG, CHOLHDL, LDLDIRECT,  in the last 72 hours ------------------------------------------------------------------------------------------------------------------  Recent Labs  06/25/14 1032  TSH 0.267*  T4TOTAL 9.1  T3FREE 3.8   ------------------------------------------------------------------------------------------------------------------ No results found for this basename: VITAMINB12, FOLATE, FERRITIN, TIBC, IRON, RETICCTPCT,  in the last 72 hours  Coagulation profile  Recent Labs Lab 06/25/14 0457  INR 1.04    No results found for this basename: DDIMER,  in the last 72 hours  Cardiac Enzymes  Recent Labs Lab 06/24/14 1829 06/24/14 2225 06/25/14 0457  TROPONINI <0.30 <0.30 <0.30   ------------------------------------------------------------------------------------------------------------------ No components found with this basename: POCBNP,      Time Spent in minutes  35   Hilaria Titsworth K M.D on 06/26/2014 at 11:46 AM  Between 7am to 7pm - Pager - [phone]  After 7pm go to [email or web address] - password [email or web address]  And look for the night coverage person covering for me after hours  [facility] Office  [phone]   **Disclaimer: This note may have been dictated with voice recognition software. Similar sounding words can inadvertently be transcribed and this note may contain transcription errors which may  not have been corrected upon publication of note.**

## 2014-06-27 ENCOUNTER — Encounter (HOSPITAL_COMMUNITY): Payer: Self-pay

## 2014-06-27 ENCOUNTER — Encounter (HOSPITAL_COMMUNITY)
Admission: EM | Disposition: A | Discharge status: Home / Self Care | Payer: Self-pay | Source: Home / Self Care | Attending: Internal Medicine

## 2014-06-27 ENCOUNTER — Inpatient Hospital Stay (HOSPITAL_COMMUNITY): Payer: BC Managed Care – PPO

## 2014-06-27 HISTORY — PX: ESOPHAGOGASTRODUODENOSCOPY: SHX5428

## 2014-06-27 LAB — COMPREHENSIVE METABOLIC PANEL
ALBUMIN: 3.4 g/dL — AB (ref 3.5–5.2)
ALT: 10 U/L (ref 0–53)
AST: 13 U/L (ref 0–37)
Alkaline Phosphatase: 69 U/L (ref 39–117)
Anion gap: 14 (ref 5–15)
BUN: 8 mg/dL (ref 6–23)
CALCIUM: 9 mg/dL (ref 8.4–10.5)
CO2: 23 meq/L (ref 19–32)
CREATININE: 0.82 mg/dL (ref 0.50–1.35)
Chloride: 103 mEq/L (ref 96–112)
GFR calc Af Amer: >90 mL/min (ref >90)
Glucose, Bld: 93 mg/dL (ref 70–99)
Potassium: 3.9 mEq/L (ref 3.7–5.3)
SODIUM: 140 meq/L (ref 137–147)
Total Bilirubin: 0.6 mg/dL (ref 0.3–1.2)
Total Protein: 6.7 g/dL (ref 6.0–8.3)

## 2014-06-27 LAB — CBC
HEMATOCRIT: 43.6 % (ref 39.0–52.0)
HEMOGLOBIN: 14.6 g/dL (ref 13.0–17.0)
MCH: 26.4 pg (ref 26.0–34.0)
MCHC: 33.5 g/dL (ref 30.0–36.0)
MCV: 79 fL (ref 78.0–100.0)
PLATELETS: 340 10*3/uL (ref 150–400)
RBC: 5.52 MIL/uL (ref 4.22–5.81)
RDW: 14.2 % (ref 11.5–15.5)
WBC: 10.6 10*3/uL — ABNORMAL HIGH (ref 4.0–10.5)

## 2014-06-27 LAB — LIPASE, BLOOD: Lipase: 19 U/L (ref 11–59)

## 2014-06-27 SURGERY — EGD (ESOPHAGOGASTRODUODENOSCOPY)
Anesthesia: Moderate Sedation

## 2014-06-27 MED ORDER — BUTAMBEN-TETRACAINE-BENZOCAINE 2-2-14 % EX AERO
INHALATION_SPRAY | CUTANEOUS | Status: DC | PRN
  Administered 2014-06-27: 2 via TOPICAL

## 2014-06-27 MED ORDER — NICOTINE 14 MG/24HR TD PT24: 14.0000 mg | MEDICATED_PATCH | Freq: Every day | TRANSDERMAL | Status: DC

## 2014-06-27 MED ORDER — MIDAZOLAM HCL 10 MG/2ML IJ SOLN
INTRAMUSCULAR | Status: DC | PRN
  Administered 2014-06-27 (×3): 2 mg via INTRAVENOUS

## 2014-06-27 MED ORDER — MIDAZOLAM HCL 10 MG/2ML IJ SOLN
INTRAMUSCULAR | Status: AC
  Filled 2014-06-27: qty 2

## 2014-06-27 MED ORDER — FENTANYL CITRATE 0.05 MG/ML IJ SOLN
INTRAMUSCULAR | Status: DC | PRN
  Administered 2014-06-27 (×3): 25 ug via INTRAVENOUS

## 2014-06-27 MED ORDER — FENTANYL CITRATE 0.05 MG/ML IJ SOLN
INTRAMUSCULAR | Status: AC
  Filled 2014-06-27: qty 2

## 2014-06-27 MED ORDER — DIPHENHYDRAMINE HCL 50 MG/ML IJ SOLN
INTRAMUSCULAR | Status: AC
Start: 2014-06-27 — End: 2014-06-27
  Filled 2014-06-27: qty 1

## 2014-06-27 MED ORDER — PANTOPRAZOLE SODIUM 40 MG PO TBEC: 40.0000 mg | DELAYED_RELEASE_TABLET | Freq: Two times a day (BID) | ORAL | Status: DC

## 2014-06-27 MED ORDER — SODIUM CHLORIDE 0.9 % IV SOLN: INTRAVENOUS | Status: DC

## 2014-06-27 NOTE — Procedures (Signed)
Interventional Radiology Procedure Note  Procedure: US guided bx of 2 left thyroid nodules.  Complications: No immediate Recommendations:  - analgesics for discomfort   Signed,  Dulcy Fanny. Earleen Newport, DO

## 2014-06-27 NOTE — H&P (View-Only) (Signed)
Unassigned Consult  Reason for Consult: Epigastric pain Referring Physician: [facility]  Dalton Arnold HPI: This is a 48 year old male with a PMH of bilateral inguinal hernia repair admitted for an acute onset of epigastric pain.  His pain started this past Sunday and it started with PO intake.  His pain initially remitted, but then it worsened again and continued to persist.  No reports of nausea, vomiting, diarrhea, or constipation.  He denies any sick contacts or any significant use of NSAIDs before the onset of his symptoms.  No known history of PUD.  The patient does drink ETOH, but I cannot discern the exact amount as he does not articulate his words.  A CT scan of the ABM was performed and there appears to be a duodenitis.  No evidence of pancreatitis with a normal lipase at 19.  History reviewed. No pertinent past medical history.  Past Surgical History  Procedure Laterality Date  . Left inguinal hernia repair with mesh  05/01/2006  . Hernia repair    . Nasal fracture surgery      History reviewed. No pertinent family history.  Social History:  reports that he has been smoking Cigarettes.  He has been smoking about 1.00 pack per day. He has never used smokeless tobacco. He reports that he drinks alcohol. He reports that he does not use illicit drugs.  Allergies: No Known Allergies  Medications:  Scheduled: . [START ON 06/28/2014] enoxaparin (LOVENOX) injection  40 mg Subcutaneous Q24H  . nicotine  14 mg Transdermal Daily  . pantoprazole (PROTONIX) IV  40 mg Intravenous Q12H   Continuous: . 0.9 % NaCl with KCl 20 mEq / L 75 mL/hr (06/26/14 1247)    Results for orders placed during the hospital encounter of 06/24/14 (from the past 24 hour(s))  CBC     Status: Abnormal   Collection Time    06/26/14  5:47 AM      Result Value Ref Range   WBC 11.1 (*) 4.0 - 10.5 K/uL   RBC 5.59  4.22 - 5.81 MIL/uL   Hemoglobin 14.6  13.0 - 17.0 g/dL   HCT 44.5  39.0 - 52.0 %    MCV 79.6  78.0 - 100.0 fL   MCH 26.1  26.0 - 34.0 pg   MCHC 32.8  30.0 - 36.0 g/dL   RDW 14.5  11.5 - 15.5 %   Platelets 315  150 - 400 K/uL  COMPREHENSIVE METABOLIC PANEL     Status: None   Collection Time    06/26/14  5:47 AM      Result Value Ref Range   Sodium 144  137 - 147 mEq/L   Potassium 4.1  3.7 - 5.3 mEq/L   Chloride 106  96 - 112 mEq/L   CO2 25  19 - 32 mEq/L   Glucose, Bld 78  70 - 99 mg/dL   BUN 9  6 - 23 mg/dL   Creatinine, Ser 0.96  0.50 - 1.35 mg/dL   Calcium 9.2  8.4 - 10.5 mg/dL   Total Protein 6.6  6.0 - 8.3 g/dL   Albumin 3.5  3.5 - 5.2 g/dL   AST 12  0 - 37 U/L   ALT 12  0 - 53 U/L   Alkaline Phosphatase 72  39 - 117 U/L   Total Bilirubin 0.6  0.3 - 1.2 mg/dL   GFR calc non Af Amer >90  >90 mL/min   GFR calc Af Amer >90  >  90 mL/min   Anion gap 13  5 - 15  LIPASE, BLOOD     Status: None   Collection Time    06/26/14  5:47 AM      Result Value Ref Range   Lipase 19  11 - 59 U/L     Dg Chest 2 View  06/24/2014   CLINICAL DATA:  Chest pain for 2 days  EXAM: CHEST  2 VIEW  COMPARISON:  04/28/2006  FINDINGS: Left lower lobe hazy airspace disease which may reflect atelectasis versus pneumonia. No other focal consolidation, pleural effusion or pneumothorax. The heart and mediastinal contours are unremarkable.  The osseous structures are unremarkable.  IMPRESSION: Hazy left lower lobe airspace disease concerning for atelectasis versus pneumonia.   Electronically Signed   By: Kathreen Devoid   On: 06/24/2014 19:24   Ct Angio Chest Pe W/cm &/or Wo Cm  06/24/2014   CLINICAL DATA:  Chest pain, abdominal pain, nausea  EXAM: CT CHEST, ABDOMEN AND PELVIS WITHOUT CONTRAST  TECHNIQUE: Multidetector CT imaging of the chest, abdomen and pelvis was performed following the standard protocol without IV contrast.  COMPARISON:  Prior radiograph from 06/24/2014  FINDINGS: CT CHEST FINDINGS  Large heterogeneous predominantly hypodense nodule measuring approximately 3.9 x 3.6 cm seen  within the left thyroid lobe. The tracheal air column is bowed to the right at this level but remains widely patent.  No pathologically enlarged mediastinal, hilar, or axillary lymph nodes are identified.  Intrathoracic aorta is of normal caliber and appearance. Great vessels within normal limits. Minimal atherosclerotic plaque present at the origin of the left common carotid artery.  Heart size is normal. No pericardial effusion. Diffuse 3 vessel coronary artery calcifications present.  Pulmonary arterial tree is well opacified. No filling defect to suggest acute pulmonary embolism identified. Re-formatted imaging confirms these findings.  Subsegmental atelectasis seen dependently within the visualized lung bases. No focal infiltrates identified. No pulmonary edema or pleural effusion. No worrisome pulmonary nodule or mass. Mild upper lobe predominant centrilobular emphysema present.  No acute osseus abnormality. No worrisome lytic or blastic osseous lesions.  CT ABDOMEN AND PELVIS FINDINGS  The liver demonstrates a normal contrast enhanced appearance. Gallbladder within normal limits. The spleen and adrenal glands are unremarkable. The pancreas is within normal limits. Delete that  Scattered cyst noted within the bilateral kidneys. No nephrolithiasis, hydronephrosis, or focal enhancing renal mass.  Stomach within normal limits.  There are circumferential mucosal enhancement with wall thickening and inflammatory stranding about the second portion of the duodenum, suggesting acute to edematous. These inflammatory changes closely approximates the pancreatic head, although the pancreas itself is favored to be within normal limits. No evidence of bowel obstruction. Appendix is normal. No other acute inflammatory changes seen about the bowels.  Fact containing paraumbilical hernia noted.  Bladder and prostate within normal limits.  No free air or fluid. No pathologically enlarged intra-abdominal pelvic lymph nodes.  Moderate aorto bi-iliac atherosclerotic calcifications present without aneurysm.  No acute osseous abnormality. No worrisome lytic or blastic osseous lesion.  IMPRESSION: CT CHEST IMPRESSION:  1. No CT evidence of acute pulmonary embolism or other acute cardiopulmonary abnormality. 2. Mild upper lobe predominant centrilobular emphysema. 3. 3.9 x 3.6 cm left thyroid nodule. Further evaluation with dedicated thyroid ultrasound recommended. This could be performed on a nonemergent basis.  CT ABDOMEN AND PELVIS IMPRESSION:  1. Circumferential wall thickening with mucosal enhancement about the second portion of the duodenum, suggesting acute duodenitis. Inflammatory stranding within the adjacent peripancreatic  fat is favored to be reactive in nature from the acute process in the duodenum, although possible acute pancreatitis not entirely excluded. Correlation with serum lipase recommended. 2. No other acute intra-abdominal or pelvic process.   Electronically Signed   By: Jeannine Boga M.D.   On: 06/24/2014 23:16   US Soft Tissue Head/neck  06/25/2014   CLINICAL DATA:  Abnormal CT chest, LEFT thyroid nodule  EXAM: THYROID ULTRASOUND  TECHNIQUE: Ultrasound examination of the thyroid gland and adjacent soft tissues was performed.  COMPARISON:  CT chest 06/24/2014  FINDINGS: Right thyroid lobe  Measurements: 6.4 x 2.0 x 2.0 cm. Homogeneous echogenicity. 4 mm calcification with shadowing at upper pole. Small hypoechoic nodules 6 x 3 x 5 mm at mid RIGHT lobe. No additional mass lesions.  Left thyroid lobe  Measurements: 7.9 x 3.6 x 3.7 cm. Normal parenchymal echogenicity. Solid isoechoic nodule at upper pole 2.6 x 1.3 x 2.3 cm. Dominant mass, heterogeneous isoechoic to hypoechoic, at inferior pole extending retroclavicular, measuring approximately 3.9 x 3.3 x 3.8 cm. This lesion corresponds to the CT finding.  Isthmus  Thickness: 5 mm thick.  No nodules visualized.  Lymphadenopathy  Normal-size lymph nodes noted  bilaterally in cervical region.  IMPRESSION: Nonspecific 4 mm calcification RIGHT thyroid lobe.  Two solid masses within LEFT thyroid lobe, larger at lower pole measuring 3.9 x 3.3 x 3.8 cm and smaller at upper pole measuring 2.6 x 1.3 x 2.3 cm.  Findings meet consensus criteria for biopsy. Ultrasound-guided fine needle aspiration should be considered, as per the consensus statement: Management of Thyroid Nodules Detected at [place]: Society of Radiologists in [place]. Radiology 2005; [id number].   Electronically Signed   By: Lavonia Dana M.D.   On: 06/25/2014 11:27   Ct Abdomen Pelvis W Contrast  06/24/2014   CLINICAL DATA:  Chest pain, abdominal pain, nausea  EXAM: CT CHEST, ABDOMEN AND PELVIS WITHOUT CONTRAST  TECHNIQUE: Multidetector CT imaging of the chest, abdomen and pelvis was performed following the standard protocol without IV contrast.  COMPARISON:  Prior radiograph from 06/24/2014  FINDINGS: CT CHEST FINDINGS  Large heterogeneous predominantly hypodense nodule measuring approximately 3.9 x 3.6 cm seen within the left thyroid lobe. The tracheal air column is bowed to the right at this level but remains widely patent.  No pathologically enlarged mediastinal, hilar, or axillary lymph nodes are identified.  Intrathoracic aorta is of normal caliber and appearance. Great vessels within normal limits. Minimal atherosclerotic plaque present at the origin of the left common carotid artery.  Heart size is normal. No pericardial effusion. Diffuse 3 vessel coronary artery calcifications present.  Pulmonary arterial tree is well opacified. No filling defect to suggest acute pulmonary embolism identified. Re-formatted imaging confirms these findings.  Subsegmental atelectasis seen dependently within the visualized lung bases. No focal infiltrates identified. No pulmonary edema or pleural effusion. No worrisome pulmonary nodule or mass. Mild upper lobe predominant centrilobular emphysema  present.  No acute osseus abnormality. No worrisome lytic or blastic osseous lesions.  CT ABDOMEN AND PELVIS FINDINGS  The liver demonstrates a normal contrast enhanced appearance. Gallbladder within normal limits. The spleen and adrenal glands are unremarkable. The pancreas is within normal limits. Delete that  Scattered cyst noted within the bilateral kidneys. No nephrolithiasis, hydronephrosis, or focal enhancing renal mass.  Stomach within normal limits.  There are circumferential mucosal enhancement with wall thickening and inflammatory stranding about the second portion of the duodenum, suggesting acute to edematous. These inflammatory changes closely  approximates the pancreatic head, although the pancreas itself is favored to be within normal limits. No evidence of bowel obstruction. Appendix is normal. No other acute inflammatory changes seen about the bowels.  Fact containing paraumbilical hernia noted.  Bladder and prostate within normal limits.  No free air or fluid. No pathologically enlarged intra-abdominal pelvic lymph nodes. Moderate aorto bi-iliac atherosclerotic calcifications present without aneurysm.  No acute osseous abnormality. No worrisome lytic or blastic osseous lesion.  IMPRESSION: CT CHEST IMPRESSION:  1. No CT evidence of acute pulmonary embolism or other acute cardiopulmonary abnormality. 2. Mild upper lobe predominant centrilobular emphysema. 3. 3.9 x 3.6 cm left thyroid nodule. Further evaluation with dedicated thyroid ultrasound recommended. This could be performed on a nonemergent basis.  CT ABDOMEN AND PELVIS IMPRESSION:  1. Circumferential wall thickening with mucosal enhancement about the second portion of the duodenum, suggesting acute duodenitis. Inflammatory stranding within the adjacent peripancreatic fat is favored to be reactive in nature from the acute process in the duodenum, although possible acute pancreatitis not entirely excluded. Correlation with serum lipase  recommended. 2. No other acute intra-abdominal or pelvic process.   Electronically Signed   By: Jeannine Boga M.D.   On: 06/24/2014 23:16    ROS:  As stated above in the HPI otherwise negative.  Blood pressure 146/89, pulse 83, temperature 98.2 F (36.8 C), temperature source Oral, resp. rate 20, height 5\' 7"  (1.702 m), weight 86.9 kg (191 lb 9.3 oz), SpO2 98.00%.    PE: Gen: NAD, Alert and Oriented HEENT:  [place]/AT, EOMI Neck: Supple, no LAD Lungs: CTA Bilaterally CV: RRR without M/G/R ABM: Soft, epigastric tenderness, +BS Ext: No C/C/E  Assessment/Plan: 1) Duodenitis. 2) Epigastric pain. 3) Small umbilical hernia.  Plan: 1) EGD tomorrow.  Cherl Gorney D 06/26/2014, 3:47 PM

## 2014-06-27 NOTE — Care Management Note (Signed)
    Page 1 of 1   06/27/2014     3:23:20 PM CARE MANAGEMENT NOTE 06/27/2014  Patient:  Dalton Arnold, Dalton Arnold   Account Number:  [id number]  Date Initiated:  06/27/2014  Documentation initiated by:  Sunday Spillers  Subjective/Objective Assessment:   48 yo male admitted with pancreatitis, duodenitis. PTA lived at home with spouse.     Action/Plan:   Home when stable   Anticipated DC Date:  06/29/2014   Anticipated DC Plan:  [place]  CM consult  PCP issues      Choice offered to / List presented to:             Status of service:  Completed, signed off Medicare Important Message given?   (If response is "NO", the following Medicare IM given date fields will be blank) Date Medicare IM given:   Medicare IM given by:   Date Additional Medicare IM given:   Additional Medicare IM given by:    Discharge Disposition:  HOME/SELF CARE  Per UR Regulation:  Reviewed for med. necessity/level of care/duration of stay  If discussed at [place] of Stay Meetings, dates discussed:    Comments:  06-27-14 [place] 1500 Spoke with patient at bedside. Discussed referral for PCP. Provided patient with resources to obtain PCP. Discussed role of PCP as patient is from [place] and did not understand why he need one, states "that's not the way we do things in my home". Patient currently uses [facility] for [facility] care. Patient appreciative of resources and agrees to f/u.

## 2014-06-27 NOTE — Interval H&P Note (Signed)
History and Physical Interval Note:  06/27/2014 4:13 PM  Dalton Arnold  has presented today for surgery, with the diagnosis of ABM pain  The various methods of treatment have been discussed with the patient and family. After consideration of risks, benefits and other options for treatment, the patient has consented to  Procedure(s): ESOPHAGOGASTRODUODENOSCOPY (EGD) (N/A) as a surgical intervention .  The patient's history has been reviewed, patient examined, no change in status, stable for surgery.  I have reviewed the patient's chart and labs.  Questions were answered to the patient's satisfaction.     Charman Blasco D

## 2014-06-27 NOTE — Progress Notes (Signed)
Patient Demographics  Dalton Arnold, is a 48 y.o. male, DOB - 1966/09/10, QIO:[id number]  Admit date - 06/24/2014   Admitting Physician Berle Mull, MD  Outpatient Primary MD for the patient is No PCP Per Patient  LOS - 3   Chief Complaint  Patient presents with  . Chest Pain        Subjective:   Dalton Arnold today has, No headache, No chest pain,  abdominal pain much improved and almost completely gone- No Nausea, No new weakness tingling or numbness, No Cough - SOB.    Assessment & Plan    1. Abd Pain due to  Duodenitis - much improved after IV PPI, continue IV Cipro Flagyl, pending H. pylori stool antigen, GI following due for EGD this morning.    2. Questionable thyroid mass on CT scan. Thyroid ultrasound notes mass as well, TSH mildly suppressed with normal free T3 and T4. FNAC ordered. Will have him follow with ENT and endocrine upon discharge. Patient told clearly that this could be a malignancy and needs close followup, he verbalizes understanding and agreed to set up appointments.    3. Nonspecific EKG changes. No chest pain this admission, -3 sets troponin, echogram shows preserved EF of 60% with no more motion abnormality. Chronic LVH. Currently compensated.   4. Occasional alcohol use and smoking. Counseled to quit. Nicotine patch.       Code Status: Full  Family Communication: none present  Disposition Plan: TBD   Procedures CTA Chest, CT Abd-Pelvis, Echo, Thyroid US, thyroid biopsy ordered   Consults  [facility]   Medications  Scheduled Meds: . [START ON 06/28/2014] enoxaparin (LOVENOX) injection  40 mg Subcutaneous Q24H  . nicotine  14 mg Transdermal Daily  . pantoprazole (PROTONIX) IV  40 mg Intravenous Q12H   Continuous Infusions: . 0.9 % NaCl  with KCl 20 mEq / L 75 mL/hr at 06/27/14 0216   PRN Meds:.acetaminophen, acetaminophen, morphine injection, ondansetron (ZOFRAN) IV, oxyCODONE  DVT Prophylaxis  Lovenox   Lab Results  Component Value Date   PLT 340 06/27/2014    Antibiotics     Anti-infectives   Start     Dose/Rate Route Frequency Ordered Stop   06/25/14 0145  ciprofloxacin (CIPRO) tablet 500 mg  Status:  Discontinued     500 mg Oral  Once 06/25/14 0132 06/25/14 0137   06/25/14 0145  metroNIDAZOLE (FLAGYL) tablet 500 mg  Status:  Discontinued     500 mg Oral  Once 06/25/14 0132 06/25/14 0137          Objective:   Filed Vitals:   06/26/14 0615 06/26/14 1400 06/26/14 2120 06/27/14 0537  BP: 137/70 146/89 122/85 130/78  Pulse: 72 83 73 70  Temp: 97.8 F (36.6 C) 98.2 F (36.8 C) 98.6 F (37 C) 98.2 F (36.8 C)  TempSrc: Oral Oral Oral Oral  Resp: 20 20 20 20   Height:      Weight:      SpO2: 96% 98% 96% 97%    Wt Readings from Last 3 Encounters:  06/26/14 86.9 kg (191 lb 9.3 oz)  06/26/14 86.9 kg (191 lb 9.3 oz)     Intake/Output Summary (Last 24 hours) at 06/27/14 0831 Last data filed at 06/27/14  0619  Gross per 24 hour  Intake 3342.5 ml  Output      0 ml  Net 3342.5 ml     Physical Exam  Awake Alert, Oriented X 3, No new F.N deficits, Normal affect [place].AT,PERRAL Supple Neck,No JVD, No cervical lymphadenopathy appriciated.  Symmetrical Chest wall movement, Good air movement bilaterally, CTAB RRR,No Gallops,Rubs or new Murmurs, No Parasternal Heave +ve B.Sounds, Abd Soft, +ve mild epigastric tenderness, No organomegaly appriciated, No rebound - guarding or rigidity. No Cyanosis, Clubbing or edema, No new Rash or bruise     Data Review   Micro Results No results found for this or any previous visit (from the past 240 hour(s)).  Radiology Reports Dg Chest 2 View  06/24/2014   CLINICAL DATA:  Chest pain for 2 days  EXAM: CHEST  2 VIEW  COMPARISON:  04/28/2006  FINDINGS: Left lower  lobe hazy airspace disease which may reflect atelectasis versus pneumonia. No other focal consolidation, pleural effusion or pneumothorax. The heart and mediastinal contours are unremarkable.  The osseous structures are unremarkable.  IMPRESSION: Hazy left lower lobe airspace disease concerning for atelectasis versus pneumonia.   Electronically Signed   By: Kathreen Devoid   On: 06/24/2014 19:24   Ct Angio Chest Pe W/cm &/or Wo Cm  06/24/2014   CLINICAL DATA:  Chest pain, abdominal pain, nausea  EXAM: CT CHEST, ABDOMEN AND PELVIS WITHOUT CONTRAST  TECHNIQUE: Multidetector CT imaging of the chest, abdomen and pelvis was performed following the standard protocol without IV contrast.  COMPARISON:  Prior radiograph from 06/24/2014  FINDINGS: CT CHEST FINDINGS  Large heterogeneous predominantly hypodense nodule measuring approximately 3.9 x 3.6 cm seen within the left thyroid lobe. The tracheal air column is bowed to the right at this level but remains widely patent.  No pathologically enlarged mediastinal, hilar, or axillary lymph nodes are identified.  Intrathoracic aorta is of normal caliber and appearance. Great vessels within normal limits. Minimal atherosclerotic plaque present at the origin of the left common carotid artery.  Heart size is normal. No pericardial effusion. Diffuse 3 vessel coronary artery calcifications present.  Pulmonary arterial tree is well opacified. No filling defect to suggest acute pulmonary embolism identified. Re-formatted imaging confirms these findings.  Subsegmental atelectasis seen dependently within the visualized lung bases. No focal infiltrates identified. No pulmonary edema or pleural effusion. No worrisome pulmonary nodule or mass. Mild upper lobe predominant centrilobular emphysema present.  No acute osseus abnormality. No worrisome lytic or blastic osseous lesions.  CT ABDOMEN AND PELVIS FINDINGS  The liver demonstrates a normal contrast enhanced appearance. Gallbladder within  normal limits. The spleen and adrenal glands are unremarkable. The pancreas is within normal limits. Delete that  Scattered cyst noted within the bilateral kidneys. No nephrolithiasis, hydronephrosis, or focal enhancing renal mass.  Stomach within normal limits.  There are circumferential mucosal enhancement with wall thickening and inflammatory stranding about the second portion of the duodenum, suggesting acute to edematous. These inflammatory changes closely approximates the pancreatic head, although the pancreas itself is favored to be within normal limits. No evidence of bowel obstruction. Appendix is normal. No other acute inflammatory changes seen about the bowels.  Fact containing paraumbilical hernia noted.  Bladder and prostate within normal limits.  No free air or fluid. No pathologically enlarged intra-abdominal pelvic lymph nodes. Moderate aorto bi-iliac atherosclerotic calcifications present without aneurysm.  No acute osseous abnormality. No worrisome lytic or blastic osseous lesion.  IMPRESSION: CT CHEST IMPRESSION:  1. No CT evidence of  acute pulmonary embolism or other acute cardiopulmonary abnormality. 2. Mild upper lobe predominant centrilobular emphysema. 3. 3.9 x 3.6 cm left thyroid nodule. Further evaluation with dedicated thyroid ultrasound recommended. This could be performed on a nonemergent basis.  CT ABDOMEN AND PELVIS IMPRESSION:  1. Circumferential wall thickening with mucosal enhancement about the second portion of the duodenum, suggesting acute duodenitis. Inflammatory stranding within the adjacent peripancreatic fat is favored to be reactive in nature from the acute process in the duodenum, although possible acute pancreatitis not entirely excluded. Correlation with serum lipase recommended. 2. No other acute intra-abdominal or pelvic process.   Electronically Signed   By: Jeannine Boga M.D.   On: 06/24/2014 23:16   Ct Abdomen Pelvis W Contrast  06/24/2014   CLINICAL DATA:   Chest pain, abdominal pain, nausea  EXAM: CT CHEST, ABDOMEN AND PELVIS WITHOUT CONTRAST  TECHNIQUE: Multidetector CT imaging of the chest, abdomen and pelvis was performed following the standard protocol without IV contrast.  COMPARISON:  Prior radiograph from 06/24/2014  FINDINGS: CT CHEST FINDINGS  Large heterogeneous predominantly hypodense nodule measuring approximately 3.9 x 3.6 cm seen within the left thyroid lobe. The tracheal air column is bowed to the right at this level but remains widely patent.  No pathologically enlarged mediastinal, hilar, or axillary lymph nodes are identified.  Intrathoracic aorta is of normal caliber and appearance. Great vessels within normal limits. Minimal atherosclerotic plaque present at the origin of the left common carotid artery.  Heart size is normal. No pericardial effusion. Diffuse 3 vessel coronary artery calcifications present.  Pulmonary arterial tree is well opacified. No filling defect to suggest acute pulmonary embolism identified. Re-formatted imaging confirms these findings.  Subsegmental atelectasis seen dependently within the visualized lung bases. No focal infiltrates identified. No pulmonary edema or pleural effusion. No worrisome pulmonary nodule or mass. Mild upper lobe predominant centrilobular emphysema present.  No acute osseus abnormality. No worrisome lytic or blastic osseous lesions.      CT ABDOMEN AND PELVIS FINDINGS  The liver demonstrates a normal contrast enhanced appearance. Gallbladder within normal limits. The spleen and adrenal glands are unremarkable. The pancreas is within normal limits. Delete that  Scattered cyst noted within the bilateral kidneys. No nephrolithiasis, hydronephrosis, or focal enhancing renal mass.  Stomach within normal limits.  There are circumferential mucosal enhancement with wall thickening and inflammatory stranding about the second portion of the duodenum, suggesting acute to edematous. These inflammatory changes  closely approximates the pancreatic head, although the pancreas itself is favored to be within normal limits. No evidence of bowel obstruction. Appendix is normal. No other acute inflammatory changes seen about the bowels.  Fact containing paraumbilical hernia noted.  Bladder and prostate within normal limits.  No free air or fluid. No pathologically enlarged intra-abdominal pelvic lymph nodes. Moderate aorto bi-iliac atherosclerotic calcifications present without aneurysm.  No acute osseous abnormality. No worrisome lytic or blastic osseous lesion.  IMPRESSION: CT CHEST IMPRESSION:  1. No CT evidence of acute pulmonary embolism or other acute cardiopulmonary abnormality. 2. Mild upper lobe predominant centrilobular emphysema. 3. 3.9 x 3.6 cm left thyroid nodule. Further evaluation with dedicated thyroid ultrasound recommended. This could be performed on a nonemergent basis.  CT ABDOMEN AND PELVIS IMPRESSION:  1. Circumferential wall thickening with mucosal enhancement about the second portion of the duodenum, suggesting acute duodenitis. Inflammatory stranding within the adjacent peripancreatic fat is favored to be reactive in nature from the acute process in the duodenum, although possible acute pancreatitis not entirely excluded.  Correlation with serum lipase recommended. 2. No other acute intra-abdominal or pelvic process.   Electronically Signed   By: Jeannine Boga M.D.   On: 06/24/2014 23:16     CBC  Recent Labs Lab 06/24/14 1829 06/25/14 0457 06/26/14 0547 06/27/14 0500  WBC 12.2* 11.9* 11.1* 10.6*  HGB 15.2 14.4 14.6 14.6  HCT 44.7 43.4 44.5 43.6  PLT 337 314 315 340  MCV 78.6 79.3 79.6 79.0  MCH 26.7 26.3 26.1 26.4  MCHC 34.0 33.2 32.8 33.5  RDW 14.5 14.6 14.5 14.2  LYMPHSABS 3.9 3.8  --   --   MONOABS 0.7 0.8  --   --   EOSABS 0.3 0.3  --   --   BASOSABS 0.0 0.0  --   --     Chemistries   Recent Labs Lab 06/24/14 1829 06/25/14 0457 06/26/14 0547 06/27/14 0500  NA 138  142 144 140  K 4.0 3.8 4.1 3.9  CL 101 107 106 103  CO2 23 25 25 23   GLUCOSE 83 100* 78 93  BUN 10 8 9 8   CREATININE 0.72 0.71 0.96 0.82  CALCIUM 9.4 8.7 9.2 9.0  AST 18 12 12 13   ALT 17 14 12 10   ALKPHOS 77 68 72 69  BILITOT 0.2* 0.4 0.6 0.6   ------------------------------------------------------------------------------------------------------------------ estimated creatinine clearance is 113.4 ml/min (by C-G formula based on Cr of 0.82). ------------------------------------------------------------------------------------------------------------------ No results found for this basename: HGBA1C,  in the last 72 hours ------------------------------------------------------------------------------------------------------------------ No results found for this basename: CHOL, HDL, LDLCALC, TRIG, CHOLHDL, LDLDIRECT,  in the last 72 hours ------------------------------------------------------------------------------------------------------------------  Recent Labs  06/25/14 1032  TSH 0.267*  T4TOTAL 9.1  T3FREE 3.8   ------------------------------------------------------------------------------------------------------------------ No results found for this basename: VITAMINB12, FOLATE, FERRITIN, TIBC, IRON, RETICCTPCT,  in the last 72 hours  Coagulation profile  Recent Labs Lab 06/25/14 0457  INR 1.04    No results found for this basename: DDIMER,  in the last 72 hours  Cardiac Enzymes  Recent Labs Lab 06/24/14 1829 06/24/14 2225 06/25/14 0457  TROPONINI <0.30 <0.30 <0.30   ------------------------------------------------------------------------------------------------------------------ No components found with this basename: POCBNP,      Time Spent in minutes  35   Sonali Wivell K M.D on 06/27/2014 at 8:31 AM  Between 7am to 7pm - Pager - [phone]  After 7pm go to [email or web address] - password [email or web address]  And look for the night coverage person covering for me after  hours  [facility] Office  [phone]   **Disclaimer: This note may have been dictated with voice recognition software. Similar sounding words can inadvertently be transcribed and this note may contain transcription errors which may not have been corrected upon publication of note.**

## 2014-06-27 NOTE — Op Note (Signed)
[facility] [place], [address]   ENDOSCOPY PROCEDURE REPORT  PATIENT: Dalton, Arnold  MR#: [id number] BIRTHDATE: 25-Sep-1966 , 73  yrs. old GENDER: male ENDOSCOPIST:Analicia Skibinski Benson [place], MD REFERRED BY: PROCEDURE DATE:  06/27/2014 PROCEDURE:   EGD w/ biopsy ASA CLASS:    Class III INDICATIONS: abnormal CT of the GI tract. MEDICATION: Versed 6 mg IV and Fentanyl 75 mcg IV TOPICAL ANESTHETIC:   Cetacaine Spray  DESCRIPTION OF PROCEDURE:   After the risks and benefits of the procedure were explained, informed consent was obtained.  The Pentax Gastroscope [id number]  endoscope was introduced through the mouth  and advanced to the second portion of the duodenum .  The instrument was slowly withdrawn as the mucosa was fully examined.  FINDINGS:  The Z-line was sharp.  No evidence of any inflammation or varices.  The fundus was negative for any evidence of fundic varices, however, the gastric body exhibited a mild portal HTN gastropathy.  In the antrum there was evidence of gastritis and cold biopsies were obtained.  In the duodenal bulb two 1 cm clearn based ulcers were identified.         Retroflexed views revealed no abnormalities.    The scope was then withdrawn from the patient and the procedure completed.  COMPLICATIONS: There were no complications.  ENDOSCOPIC IMPRESSION: 1) Duodenal clean-based ulcers. 2) Gastritis. 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  RECOMMENDATIONS: 1) Await biopsy results. 2) PPI QD. 3) Avoid NSAIDS. 4) No ETOH. 5) Follow up in the office in two weeks. _______________________________ eSignedCarol Ada, MD 06/27/2014 4:31 PM     cc:

## 2014-06-28 LAB — COMPREHENSIVE METABOLIC PANEL
ALT: 14 U/L (ref 0–53)
AST: 14 U/L (ref 0–37)
Albumin: 3.8 g/dL (ref 3.5–5.2)
Alkaline Phosphatase: 79 U/L (ref 39–117)
Anion gap: 14 (ref 5–15)
BILIRUBIN TOTAL: 0.6 mg/dL (ref 0.3–1.2)
BUN: 11 mg/dL (ref 6–23)
CHLORIDE: 101 meq/L (ref 96–112)
CO2: 25 meq/L (ref 19–32)
CREATININE: 0.96 mg/dL (ref 0.50–1.35)
Calcium: 9.4 mg/dL (ref 8.4–10.5)
GFR calc Af Amer: >90 mL/min (ref >90)
GFR calc non Af Amer: >90 mL/min (ref >90)
Glucose, Bld: 95 mg/dL (ref 70–99)
Potassium: 4.4 mEq/L (ref 3.7–5.3)
Sodium: 140 mEq/L (ref 137–147)
Total Protein: 7.5 g/dL (ref 6.0–8.3)

## 2014-06-28 LAB — CBC
HCT: 46.7 % (ref 39.0–52.0)
HEMOGLOBIN: 15.7 g/dL (ref 13.0–17.0)
MCH: 26.4 pg (ref 26.0–34.0)
MCHC: 33.6 g/dL (ref 30.0–36.0)
MCV: 78.5 fL (ref 78.0–100.0)
PLATELETS: 371 10*3/uL (ref 150–400)
RBC: 5.95 MIL/uL — AB (ref 4.22–5.81)
RDW: 14.2 % (ref 11.5–15.5)
WBC: 10.9 10*3/uL — ABNORMAL HIGH (ref 4.0–10.5)

## 2014-06-28 LAB — LIPASE, BLOOD: Lipase: 20 U/L (ref 11–59)

## 2014-06-28 NOTE — Discharge Summary (Signed)
Dalton Arnold, is a 48 y.o. male  DOB [birth year]  MRN [id number].  Admission date:  06/24/2014  Admitting Physician  Berle Mull, MD  Discharge Date:  06/28/2014   Primary MD  No PCP Per Patient  Recommendations for primary care physician for things to follow:   Please follow thyroid biopsy in stomach biopsy results. Must follow with ENT, GI and endocrine refreshed. Patient has been counseled he understands the importance of followup is in is agreed to make appointments.   Admission Diagnosis  Left thyroid nodule [241.0] Duodenitis [535.60] Acute pancreatitis, unspecified pancreatitis type [577.0] Pulmonary emphysema, unspecified emphysema type [492.8]   Discharge Diagnosis  Left thyroid nodule [241.0] Duodenitis [535.60] Acute pancreatitis, unspecified pancreatitis type [577.0] Pulmonary emphysema, unspecified emphysema type [492.8]     Principal Problem:   Duodenitis Active Problems:   Acute pancreatitis   Alcohol use   Smoker      History reviewed. No pertinent past medical history.  Past Surgical History  Procedure Laterality Date  . Left inguinal hernia repair with mesh  05/01/2006  . Hernia repair    . Nasal fracture surgery         History of present illness and  Hospital Course:     Kindly see H&P for history of present illness and admission details, please review complete Labs, Consult reports and Test reports for all details in brief  HPI  from the history and physical done on the day of admission  Dalton Arnold is a 48 y.o. male with Past medical history of alcohol use as well as an active smoker.  Patient presented with complaints of abdominal pain that has been ongoing since last Friday. Patient mentions the pain is located in the epigastric region and was initially started as a burning  sensation. Later on the pain worsened to sharp pain and was radiating to his chest wall as well as between his spine.  He mentions he has been taking Aleve on and off to help with his pain.  He denies any fever or chills he denies any vomiting but complains of nausea. He denies any diarrhea or active bleeding.  He denies any burning urination.  He denies any recent change in his medication prior to this pain.  He denies any drug abuse.  He mentions he has been active smoker and smokes one to 2 pack a day.  He also mentions he drinks alcohol every now and then but since last Friday he has been drinking almost on a daily basis.  The patient is coming from home. And at his baseline independent for most of his ADL.   Hospital Course   1. Abdominal pain due to gastritis and duodenal ulcer. Seen by GI underwent EGD, biopsy results pending, better with IV PPI which will be switched to oral, counseled to stop smoking and to stop intermittent alcohol use (says drinks 3-4 bottles of beer 2-3 times a week) . Will need to follow with GI for biopsy results.   2. Incidental finding of thyroid  mass confirmed with ultrasound. TSH was mildly suppressed with normal free T3 and T4. Ultrasound-guided biopsy done on 06/27/2014, will need to follow with ENT and endocrine to follow up on biopsy results. He has been extensively counseled about this followup and told that this could be cancer. He has verbalized understanding and agree to make appointments.   3. Ongoing smoking. Placed on nicotine patch. Intermittent alcohol use counseled to quit he has agreed to do so. No signs of DTs.    4. Nonspecific EKG changes on admission. No chest pain or discomfort, 3 sets of troponin negative, echogram shows EF 60% with mild chronic LVH and no wall motion abnormality.     Discharge Condition: stable   Follow UP  Follow-up Information   Follow up with [facility]    . Schedule an  appointment as soon as possible for a visit in 1 week. (thyroid mass-biospy results)    Contact information:   [address] [place] [address] [phone]      Follow up with Melony Overly, MD. Schedule an appointment as soon as possible for a visit in 1 week. (thyroid mass, biopsy results)    Specialty:  Otolaryngology   Contact information:   [place] [address] [phone]       Follow up with Beryle Beams, MD. Schedule an appointment as soon as possible for a visit in 1 week.   Specialty:  Gastroenterology   Contact information:   [address] [place] [address] [phone]       Follow up with Renato Shin, MD. Schedule an appointment as soon as possible for a visit in 1 week. (thyroid mass, biopsy results)    Specialty:  Endocrinology   Contact information:   [address] [place] [address] [phone]         Discharge Instructions  and  Discharge Medications     Discharge Instructions   Discharge instructions    Complete by:  As directed   Follow with Primary MD and recommended ENT, GI and endocrine physicians in 7 days   Follow on the thyroid biopsy and stomach biopsy results as this could be cancer.   Get CBC, CMP, 2 view Chest X ray checked  by Primary MD next visit.    Activity: As tolerated with Full fall precautions use walker/cane & assistance as needed   Disposition Home    Diet: Heart Healthy   For Heart failure patients - Check your Weight same time everyday, if you gain over 2 pounds, or you develop in leg swelling, experience more shortness of breath or chest pain, call your Primary MD immediately. Follow Cardiac Low Salt Diet and 1.8 lit/day fluid restriction.   On your next visit with her primary care physician please Get Medicines reviewed and adjusted.  Please request your Prim.MD to go over all Hospital Tests and Procedure/Radiological results at  the follow up, please get all Hospital records sent to your Prim MD by signing hospital release before you go home.   If you experience worsening of your admission symptoms, develop shortness of breath, life threatening emergency, suicidal or homicidal thoughts you must seek medical attention immediately by calling 911 or calling your MD immediately  if symptoms less severe.  You Must read complete instructions/literature along with all the possible adverse reactions/side effects for all the Medicines you take and that have been prescribed to you. Take any new Medicines after  you have completely understood and accpet all the possible adverse reactions/side effects.   Do not drive, operating heavy machinery, perform activities at heights, swimming or participation in water activities or provide baby sitting services if your were admitted for syncope or siezures until you have seen by Primary MD or a Neurologist and advised to do so again.  Do not drive when taking Pain medications.    Do not take more than prescribed Pain, Sleep and Anxiety Medications  Special Instructions: If you have smoked or chewed Tobacco  in the last 2 yrs please stop smoking, stop any regular Alcohol  and or any Recreational drug use.  Wear Seat belts while driving.   Please note  You were cared for by a hospitalist during your hospital stay. If you have any questions about your discharge medications or the care you received while you were in the hospital after you are discharged, you can call the unit and asked to speak with the hospitalist on call if the hospitalist that took care of you is not available. Once you are discharged, your primary care physician will handle any further medical issues. Please note that NO REFILLS for any discharge medications will be authorized once you are discharged, as it is imperative that you return to your primary care physician (or establish a relationship with a primary care physician  if you do not have one) for your aftercare needs so that they can reassess your need for medications and monitor your lab values.     Increase activity slowly    Complete by:  As directed             Medication List    STOP taking these medications       naproxen sodium 220 MG tablet  Commonly known as:  ANAPROX      TAKE these medications       nicotine 14 mg/24hr patch  Commonly known as:  NICODERM CQ - dosed in mg/24 hours  Place 1 patch (14 mg total) onto the skin daily.     pantoprazole 40 MG tablet  Commonly known as:  PROTONIX  Take 1 tablet (40 mg total) by mouth 2 (two) times daily. Switch for any other PPI at similar dose and frequency          Diet and Activity recommendation: See Discharge Instructions above   Consults obtained - GI,IR   Major procedures and Radiology Reports - PLEASE review detailed and final reports for all details, in brief -   EGD  ENDOSCOPIC IMPRESSION:  1) Duodenal clean-based ulcers.  2) Gastritis.  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   RECOMMENDATIONS:  1) Await biopsy results.  2) PPI QD.  3) Avoid NSAIDS.  4) No ETOH.  5) Follow up in the office in two weeks.  _______________________________  eSignedCarol Ada, MD 06/27/2014 4:31 PM    US guided thyroid biopsy. Results pending   Dg Chest 2 View  06/24/2014   CLINICAL DATA:  Chest pain for 2 days  EXAM: CHEST  2 VIEW  COMPARISON:  04/28/2006  FINDINGS: Left lower lobe hazy airspace disease which may reflect atelectasis versus pneumonia. No other focal consolidation, pleural effusion or pneumothorax. The heart and mediastinal contours are unremarkable.  The osseous structures are unremarkable.  IMPRESSION: Hazy left lower lobe airspace disease concerning for atelectasis versus pneumonia.   Electronically Signed   By: Kathreen Devoid   On: 06/24/2014 19:24   Ct Angio  Chest Pe W/cm &/or Wo Cm  06/24/2014   CLINICAL DATA:  Chest pain, abdominal  pain, nausea  EXAM: CT CHEST, ABDOMEN AND PELVIS WITHOUT CONTRAST  TECHNIQUE: Multidetector CT imaging of the chest, abdomen and pelvis was performed following the standard protocol without IV contrast.  COMPARISON:  Prior radiograph from 06/24/2014  FINDINGS: CT CHEST FINDINGS  Large heterogeneous predominantly hypodense nodule measuring approximately 3.9 x 3.6 cm seen within the left thyroid lobe. The tracheal air column is bowed to the right at this level but remains widely patent.  No pathologically enlarged mediastinal, hilar, or axillary lymph nodes are identified.  Intrathoracic aorta is of normal caliber and appearance. Great vessels within normal limits. Minimal atherosclerotic plaque present at the origin of the left common carotid artery.  Heart size is normal. No pericardial effusion. Diffuse 3 vessel coronary artery calcifications present.  Pulmonary arterial tree is well opacified. No filling defect to suggest acute pulmonary embolism identified. Re-formatted imaging confirms these findings.  Subsegmental atelectasis seen dependently within the visualized lung bases. No focal infiltrates identified. No pulmonary edema or pleural effusion. No worrisome pulmonary nodule or mass. Mild upper lobe predominant centrilobular emphysema present.  No acute osseus abnormality. No worrisome lytic or blastic osseous lesions.  CT ABDOMEN AND PELVIS FINDINGS  The liver demonstrates a normal contrast enhanced appearance. Gallbladder within normal limits. The spleen and adrenal glands are unremarkable. The pancreas is within normal limits. Delete that  Scattered cyst noted within the bilateral kidneys. No nephrolithiasis, hydronephrosis, or focal enhancing renal mass.  Stomach within normal limits.  There are circumferential mucosal enhancement with wall thickening and inflammatory stranding about the second portion of the duodenum, suggesting acute to edematous. These inflammatory changes closely approximates the  pancreatic head, although the pancreas itself is favored to be within normal limits. No evidence of bowel obstruction. Appendix is normal. No other acute inflammatory changes seen about the bowels.  Fact containing paraumbilical hernia noted.  Bladder and prostate within normal limits.  No free air or fluid. No pathologically enlarged intra-abdominal pelvic lymph nodes. Moderate aorto bi-iliac atherosclerotic calcifications present without aneurysm.  No acute osseous abnormality. No worrisome lytic or blastic osseous lesion.  IMPRESSION: CT CHEST IMPRESSION:  1. No CT evidence of acute pulmonary embolism or other acute cardiopulmonary abnormality. 2. Mild upper lobe predominant centrilobular emphysema. 3. 3.9 x 3.6 cm left thyroid nodule. Further evaluation with dedicated thyroid ultrasound recommended. This could be performed on a nonemergent basis.  CT ABDOMEN AND PELVIS IMPRESSION:  1. Circumferential wall thickening with mucosal enhancement about the second portion of the duodenum, suggesting acute duodenitis. Inflammatory stranding within the adjacent peripancreatic fat is favored to be reactive in nature from the acute process in the duodenum, although possible acute pancreatitis not entirely excluded. Correlation with serum lipase recommended. 2. No other acute intra-abdominal or pelvic process.   Electronically Signed   By: Jeannine Boga M.D.   On: 06/24/2014 23:16   US Soft Tissue Head/neck  06/25/2014   CLINICAL DATA:  Abnormal CT chest, LEFT thyroid nodule  EXAM: THYROID ULTRASOUND  TECHNIQUE: Ultrasound examination of the thyroid gland and adjacent soft tissues was performed.  COMPARISON:  CT chest 06/24/2014  FINDINGS: Right thyroid lobe  Measurements: 6.4 x 2.0 x 2.0 cm. Homogeneous echogenicity. 4 mm calcification with shadowing at upper pole. Small hypoechoic nodules 6 x 3 x 5 mm at mid RIGHT lobe. No additional mass lesions.  Left thyroid lobe  Measurements: 7.9 x 3.6 x 3.7 cm.  Normal  parenchymal echogenicity. Solid isoechoic nodule at upper pole 2.6 x 1.3 x 2.3 cm. Dominant mass, heterogeneous isoechoic to hypoechoic, at inferior pole extending retroclavicular, measuring approximately 3.9 x 3.3 x 3.8 cm. This lesion corresponds to the CT finding.  Isthmus  Thickness: 5 mm thick.  No nodules visualized.  Lymphadenopathy  Normal-size lymph nodes noted bilaterally in cervical region.  IMPRESSION: Nonspecific 4 mm calcification RIGHT thyroid lobe.  Two solid masses within LEFT thyroid lobe, larger at lower pole measuring 3.9 x 3.3 x 3.8 cm and smaller at upper pole measuring 2.6 x 1.3 x 2.3 cm.  Findings meet consensus criteria for biopsy. Ultrasound-guided fine needle aspiration should be considered, as per the consensus statement: Management of Thyroid Nodules Detected at [place]: Society of Radiologists in [place]. Radiology 2005; [id number].   Electronically Signed   By: Lavonia Dana M.D.   On: 06/25/2014 11:27   Ct Abdomen Pelvis W Contrast  06/24/2014   CLINICAL DATA:  Chest pain, abdominal pain, nausea  EXAM: CT CHEST, ABDOMEN AND PELVIS WITHOUT CONTRAST  TECHNIQUE: Multidetector CT imaging of the chest, abdomen and pelvis was performed following the standard protocol without IV contrast.  COMPARISON:  Prior radiograph from 06/24/2014  FINDINGS: CT CHEST FINDINGS  Large heterogeneous predominantly hypodense nodule measuring approximately 3.9 x 3.6 cm seen within the left thyroid lobe. The tracheal air column is bowed to the right at this level but remains widely patent.  No pathologically enlarged mediastinal, hilar, or axillary lymph nodes are identified.  Intrathoracic aorta is of normal caliber and appearance. Great vessels within normal limits. Minimal atherosclerotic plaque present at the origin of the left common carotid artery.  Heart size is normal. No pericardial effusion. Diffuse 3 vessel coronary artery calcifications present.  Pulmonary arterial  tree is well opacified. No filling defect to suggest acute pulmonary embolism identified. Re-formatted imaging confirms these findings.  Subsegmental atelectasis seen dependently within the visualized lung bases. No focal infiltrates identified. No pulmonary edema or pleural effusion. No worrisome pulmonary nodule or mass. Mild upper lobe predominant centrilobular emphysema present.  No acute osseus abnormality. No worrisome lytic or blastic osseous lesions.  CT ABDOMEN AND PELVIS FINDINGS  The liver demonstrates a normal contrast enhanced appearance. Gallbladder within normal limits. The spleen and adrenal glands are unremarkable. The pancreas is within normal limits. Delete that  Scattered cyst noted within the bilateral kidneys. No nephrolithiasis, hydronephrosis, or focal enhancing renal mass.  Stomach within normal limits.  There are circumferential mucosal enhancement with wall thickening and inflammatory stranding about the second portion of the duodenum, suggesting acute to edematous. These inflammatory changes closely approximates the pancreatic head, although the pancreas itself is favored to be within normal limits. No evidence of bowel obstruction. Appendix is normal. No other acute inflammatory changes seen about the bowels.  Fact containing paraumbilical hernia noted.  Bladder and prostate within normal limits.  No free air or fluid. No pathologically enlarged intra-abdominal pelvic lymph nodes. Moderate aorto bi-iliac atherosclerotic calcifications present without aneurysm.  No acute osseous abnormality. No worrisome lytic or blastic osseous lesion.  IMPRESSION: CT CHEST IMPRESSION:  1. No CT evidence of acute pulmonary embolism or other acute cardiopulmonary abnormality. 2. Mild upper lobe predominant centrilobular emphysema. 3. 3.9 x 3.6 cm left thyroid nodule. Further evaluation with dedicated thyroid ultrasound recommended. This could be performed on a nonemergent basis.  CT ABDOMEN AND PELVIS  IMPRESSION:  1. Circumferential wall thickening with mucosal enhancement about the second portion  of the duodenum, suggesting acute duodenitis. Inflammatory stranding within the adjacent peripancreatic fat is favored to be reactive in nature from the acute process in the duodenum, although possible acute pancreatitis not entirely excluded. Correlation with serum lipase recommended. 2. No other acute intra-abdominal or pelvic process.   Electronically Signed   By: Jeannine Boga M.D.   On: 06/24/2014 23:16   US Thyroid Biopsy  06/27/2014   CLINICAL DATA:  48 year old male with history of 2 left-sided thyroid nodules. He has been referred for biopsy  EXAM: ULTRASOUND GUIDED fine needle aspirate BIOPSY OF left thyroid lobe  MEDICATIONS: Total Moderate Sedation Time: 0  PROCEDURE: The procedure, risks, benefits, and alternatives were explained to the patient. Questions regarding the procedure were encouraged and answered. The patient understands and consents to the procedure.  The patient is placed in the supine position on the stretcher in the ultrasound suite. Ultrasound survey of the left thyroid lobe was performed with images stored and sent to PACs.  The left neck was prepped with Betadine in a sterile fashion, and a sterile drape was applied covering the operative field. A sterile gown and sterile gloves were used for the procedure. Local anesthesia was provided with 1% Lidocaine.  Using ultrasound guidance, the skin and subcutaneous tissues overlying the left thyroid were infiltrated with 1% lidocaine for local anesthesia. First we targeted the inferior thyroid nodule. Three separate fine-needle aspirate of the lower thyroid nodule were performed under ultrasound guidance. The tissue specimen was passed to a cytotechnologist in the room for transportation to the lab.  We then targeted superior left thyroid nodule. The skin and subcutaneous tissues were infiltrated with 1% lidocaine overlying this nodule,  and 3 separate fine-needle aspirate were retrieved under ultrasound guidance. The tissue specimen passed to a cytotechnologist in the room for transportation to the lab.  The patient tolerated the procedure well and remained hemodynamically stable throughout.  No complications were encountered and no significant blood loss encounter.  A final ultrasound survey demonstrated no complicating features.  COMPLICATIONS: None.  FINDINGS: Two separate thyroid nodules on the left, similar to comparison ultrasound study. The largest is at the inferior left thyroid with heterogeneous echotexture.  Images during the case demonstrate needle tip within the separate nodules on each needle pass.  IMPRESSION: Status post biopsy of 2 separate left-sided thyroid nodules, with tissue specimen sent to pathology for complete histopathologic analysis.  Signed,  Dulcy Fanny. Earleen Newport, DO  [facility]  [facility]   Electronically Signed   By: Corrie Mckusick O.D.   On: 06/27/2014 17:13    Micro Results      No results found for this or any previous visit (from the past 240 hour(s)).     Today   Subjective:   Sterling Tennis today has no headache,no chest abdominal pain,no new weakness tingling or numbness, feels much better wants to go home today.   Objective:   Blood pressure 123/69, pulse 74, temperature 98 F (36.7 C), temperature source Oral, resp. rate 18, height 5\' 7"  (1.702 m), weight 86.9 kg (191 lb 9.3 oz), SpO2 94.00%.   Intake/Output Summary (Last 24 hours) at 06/28/14 0750 Last data filed at 06/28/14 0031  Gross per 24 hour  Intake    600 ml  Output      0 ml  Net    600 ml    Exam Awake Alert, Oriented x 3, No new F.N deficits, Normal affect [place].AT,PERRAL Supple Neck,No JVD, No cervical  lymphadenopathy appriciated.  Symmetrical Chest wall movement, Good air movement bilaterally, CTAB RRR,No Gallops,Rubs or new Murmurs, No Parasternal Heave +ve  B.Sounds, Abd Soft, Non tender, No organomegaly appriciated, No rebound -guarding or rigidity. No Cyanosis, Clubbing or edema, No new Rash or bruise  Data Review   CBC w Diff: Lab Results  Component Value Date   WBC 10.9* 06/28/2014   HGB 15.7 06/28/2014   HCT 46.7 06/28/2014   PLT 371 06/28/2014   LYMPHOPCT 32 06/25/2014   MONOPCT 6 06/25/2014   EOSPCT 2 06/25/2014   BASOPCT 0 06/25/2014    CMP: Lab Results  Component Value Date   NA 140 06/28/2014   K 4.4 06/28/2014   CL 101 06/28/2014   CO2 25 06/28/2014   BUN 11 06/28/2014   CREATININE 0.96 06/28/2014   PROT 7.5 06/28/2014   ALBUMIN 3.8 06/28/2014   BILITOT 0.6 06/28/2014   ALKPHOS 79 06/28/2014   AST 14 06/28/2014   ALT 14 06/28/2014  .   Total Time in preparing paper work, data evaluation and todays exam - 35 minutes  Thurnell Lose M.D on 06/28/2014 at 7:50 AM  [facility] Office  [phone]   **Disclaimer: This note may have been dictated with voice recognition software. Similar sounding words can inadvertently be transcribed and this note may contain transcription errors which may not have been corrected upon publication of note.**

## 2014-06-28 NOTE — Progress Notes (Addendum)
     Dalton Arnold was admitted to the Hospital on 06/24/2014 and Discharged  06/28/2014 and should be excused from work/school   for 8  days starting 06/24/2014 , may return to work/school without any restrictions.  Call Lala Lund MD, [facility] [phone] with questions.  Thurnell Lose M.D on 06/28/2014,at 8:16 AM  [facility]   Office  [phone]

## 2014-06-28 NOTE — Discharge Instructions (Signed)
Follow with Primary MD and recommended ENT, GI and endocrine physicians in 7 days   Follow on the thyroid biopsy and stomach biopsy results as this could be cancer.   Get CBC, CMP, 2 view Chest X ray checked  by Primary MD next visit.    Activity: As tolerated with Full fall precautions use walker/cane & assistance as needed   Disposition Home    Diet: Heart Healthy   For Heart failure patients - Check your Weight same time everyday, if you gain over 2 pounds, or you develop in leg swelling, experience more shortness of breath or chest pain, call your Primary MD immediately. Follow Cardiac Low Salt Diet and 1.8 lit/day fluid restriction.   On your next visit with her primary care physician please Get Medicines reviewed and adjusted.  Please request your Prim.MD to go over all Hospital Tests and Procedure/Radiological results at the follow up, please get all Hospital records sent to your Prim MD by signing hospital release before you go home.   If you experience worsening of your admission symptoms, develop shortness of breath, life threatening emergency, suicidal or homicidal thoughts you must seek medical attention immediately by calling 911 or calling your MD immediately  if symptoms less severe.  You Must read complete instructions/literature along with all the possible adverse reactions/side effects for all the Medicines you take and that have been prescribed to you. Take any new Medicines after you have completely understood and accpet all the possible adverse reactions/side effects.   Do not drive, operating heavy machinery, perform activities at heights, swimming or participation in water activities or provide baby sitting services if your were admitted for syncope or siezures until you have seen by Primary MD or a Neurologist and advised to do so again.  Do not drive when taking Pain medications.    Do not take more than prescribed Pain, Sleep and Anxiety  Medications  Special Instructions: If you have smoked or chewed Tobacco  in the last 2 yrs please stop smoking, stop any regular Alcohol  and or any Recreational drug use.  Wear Seat belts while driving.   Please note  You were cared for by a hospitalist during your hospital stay. If you have any questions about your discharge medications or the care you received while you were in the hospital after you are discharged, you can call the unit and asked to speak with the hospitalist on call if the hospitalist that took care of you is not available. Once you are discharged, your primary care physician will handle any further medical issues. Please note that NO REFILLS for any discharge medications will be authorized once you are discharged, as it is imperative that you return to your primary care physician (or establish a relationship with a primary care physician if you do not have one) for your aftercare needs so that they can reassess your need for medications and monitor your lab values.    Esophagogastroduodenoscopy Care After Refer to this sheet in the next few weeks. These instructions provide you with information on caring for yourself after your procedure. Your caregiver may also give you more specific instructions. Your treatment has been planned according to current medical practices, but problems sometimes occur. Call your caregiver if you have any problems or questions after your procedure.  HOME CARE INSTRUCTIONS  Do not eat or drink anything until the numbing medicine (local anesthetic) has worn off and your gag reflex has returned. You will know that the local  anesthetic has worn off when you can swallow comfortably.  Do not drive for 12 hours after the procedure or as directed by your caregiver.  Only take medicines as directed by your caregiver. SEEK MEDICAL CARE IF:   You cannot stop coughing.  You are not urinating at all or less than usual. SEEK IMMEDIATE MEDICAL CARE  IF:  You have difficulty swallowing.  You cannot eat or drink.  You have worsening throat or chest pain.  You have dizziness, lightheadedness, or you faint.  You have nausea or vomiting.  You have chills.  You have a fever.  You have severe abdominal pain.  You have black, tarry, or bloody stools. Document Released: 09/05/2012 Document Reviewed: 09/05/2012 [facility] Patient Information 2015 [employer]. This information is not intended to replace advice given to you by your health care provider. Make sure you discuss any questions you have with your health care provider.

## 2014-06-30 ENCOUNTER — Encounter (HOSPITAL_COMMUNITY): Payer: Self-pay | Admitting: Gastroenterology

## 2014-07-10 ENCOUNTER — Encounter: Payer: Self-pay | Admitting: Internal Medicine

## 2014-07-10 ENCOUNTER — Ambulatory Visit: Payer: BC Managed Care – PPO | Attending: Internal Medicine | Admitting: Internal Medicine

## 2014-07-10 VITALS — BP 134/84 | HR 64 | Temp 98.5°F | Resp 18 | Ht 68.0 in | Wt 191.2 lb

## 2014-07-10 DIAGNOSIS — R221 Localized swelling, mass and lump, neck: Secondary | ICD-10-CM | POA: Insufficient documentation

## 2014-07-10 DIAGNOSIS — F17210 Nicotine dependence, cigarettes, uncomplicated: Secondary | ICD-10-CM | POA: Diagnosis not present

## 2014-07-10 DIAGNOSIS — Z79899 Other long term (current) drug therapy: Secondary | ICD-10-CM | POA: Insufficient documentation

## 2014-07-10 DIAGNOSIS — F17200 Nicotine dependence, unspecified, uncomplicated: Secondary | ICD-10-CM

## 2014-07-10 DIAGNOSIS — B9681 Helicobacter pylori [H. pylori] as the cause of diseases classified elsewhere: Secondary | ICD-10-CM

## 2014-07-10 DIAGNOSIS — K2980 Duodenitis without bleeding: Secondary | ICD-10-CM | POA: Diagnosis not present

## 2014-07-10 NOTE — Patient Instructions (Signed)
Smoking Cessation Quitting smoking is important to your health and has many advantages. However, it is not always easy to quit since nicotine is a very addictive drug. Oftentimes, people try 3 times or more before being able to quit. This document explains the best ways for you to prepare to quit smoking. Quitting takes hard work and a lot of effort, but you can do it. ADVANTAGES OF QUITTING SMOKING  You will live longer, feel better, and live better.  Your body will feel the impact of quitting smoking almost immediately.  Within 20 minutes, blood pressure decreases. Your pulse returns to its normal level.  After 8 hours, carbon monoxide levels in the blood return to normal. Your oxygen level increases.  After 24 hours, the chance of having a heart attack starts to decrease. Your breath, hair, and body stop smelling like smoke.  After 48 hours, damaged nerve endings begin to recover. Your sense of taste and smell improve.  After 72 hours, the body is virtually free of nicotine. Your bronchial tubes relax and breathing becomes easier.  After 2 to 12 weeks, lungs can hold more air. Exercise becomes easier and circulation improves.  The risk of having a heart attack, stroke, cancer, or lung disease is greatly reduced.  After 1 year, the risk of coronary heart disease is cut in half.  After 5 years, the risk of stroke falls to the same as a nonsmoker.  After 10 years, the risk of lung cancer is cut in half and the risk of other cancers decreases significantly.  After 15 years, the risk of coronary heart disease drops, usually to the level of a nonsmoker.  If you are pregnant, quitting smoking will improve your chances of having a healthy baby.  The people you live with, especially any children, will be healthier.  You will have extra money to spend on things other than cigarettes. QUESTIONS TO THINK ABOUT BEFORE ATTEMPTING TO QUIT You may want to talk about your answers with your  health care provider.  Why do you want to quit?  If you tried to quit in the past, what helped and what did not?  What will be the most difficult situations for you after you quit? How will you plan to handle them?  Who can help you through the tough times? Your family? Friends? A health care provider?  What pleasures do you get from smoking? What ways can you still get pleasure if you quit? Here are some questions to ask your health care provider:  How can you help me to be successful at quitting?  What medicine do you think would be best for me and how should I take it?  What should I do if I need more help?  What is smoking withdrawal like? How can I get information on withdrawal? GET READY  Set a quit date.  Change your environment by getting rid of all cigarettes, ashtrays, matches, and lighters in your home, car, or work. Do not let people smoke in your home.  Review your past attempts to quit. Think about what worked and what did not. GET SUPPORT AND ENCOURAGEMENT You have a better chance of being successful if you have help. You can get support in many ways.  Tell your family, friends, and coworkers that you are going to quit and need their support. Ask them not to smoke around you.  Get individual, group, or telephone counseling and support. Programs are available at local hospitals and health centers. Call   your local health department for information about programs in your area.  Spiritual beliefs and practices may help some smokers quit.  Download a "quit meter" on your computer to keep track of quit statistics, such as how long you have gone without smoking, cigarettes not smoked, and money saved.  Get a self-help book about quitting smoking and staying off tobacco. LEARN NEW SKILLS AND BEHAVIORS  Distract yourself from urges to smoke. Talk to someone, go for a walk, or occupy your time with a task.  Change your normal routine. Take a different route to work.  Drink tea instead of coffee. Eat breakfast in a different place.  Reduce your stress. Take a hot bath, exercise, or read a book.  Plan something enjoyable to do every day. Reward yourself for not smoking.  Explore interactive web-based programs that specialize in helping you quit. GET MEDICINE AND USE IT CORRECTLY Medicines can help you stop smoking and decrease the urge to smoke. Combining medicine with the above behavioral methods and support can greatly increase your chances of successfully quitting smoking.  Nicotine replacement therapy helps deliver nicotine to your body without the negative effects and risks of smoking. Nicotine replacement therapy includes nicotine gum, lozenges, inhalers, nasal sprays, and skin patches. Some may be available over-the-counter and others require a prescription.  Antidepressant medicine helps people abstain from smoking, but how this works is unknown. This medicine is available by prescription.  Nicotinic receptor partial agonist medicine simulates the effect of nicotine in your brain. This medicine is available by prescription. Ask your health care provider for advice about which medicines to use and how to use them based on your health history. Your health care provider will tell you what side effects to look out for if you choose to be on a medicine or therapy. Carefully read the information on the package. Do not use any other product containing nicotine while using a nicotine replacement product.  RELAPSE OR DIFFICULT SITUATIONS Most relapses occur within the first 3 months after quitting. Do not be discouraged if you start smoking again. Remember, most people try several times before finally quitting. You may have symptoms of withdrawal because your body is used to nicotine. You may crave cigarettes, be irritable, feel very hungry, cough often, get headaches, or have difficulty concentrating. The withdrawal symptoms are only temporary. They are strongest  when you first quit, but they will go away within 10-14 days. To reduce the chances of relapse, try to:  Avoid drinking alcohol. Drinking lowers your chances of successfully quitting.  Reduce the amount of caffeine you consume. Once you quit smoking, the amount of caffeine in your body increases and can give you symptoms, such as a rapid heartbeat, sweating, and anxiety.  Avoid smokers because they can make you want to smoke.  Do not let weight gain distract you. Many smokers will gain weight when they quit, usually less than 10 pounds. Eat a healthy diet and stay active. You can always lose the weight gained after you quit.  Find ways to improve your mood other than smoking. FOR MORE INFORMATION  www.smokefree.gov  Document Released: 09/13/2001 Document Revised: 02/03/2014 Document Reviewed: 12/29/2011 [employer] Patient Information 2015 [employer]. This information is not intended to replace advice given to you by your health care provider. Make sure you discuss any questions you have with your health care provider.  

## 2014-07-10 NOTE — Progress Notes (Signed)
Patient ID: Dalton Arnold, male   DOB: April 08, 1966, 48 y.o.   MRN: [id number]  FOY:[id number]  [id number]:[id number]  DOB - 06-Nov-1965  CC:  Chief Complaint  Patient presents with  . Establish Care       HPI: Dalton Arnold is a 48 y.o. male here today to establish medical care.  Patient reports that has been to see Dr. Benson Arnold (GI) and was told that he had a bacteria in his stomach. Today he presents with dexilant as the only medication he has been taking. Patient is unsure if he was given antibiotics for H. Pylori.  He states that he still has been having some nausea but denies abdominal pain and vomiting.   He has a appointment next week to meet with the endocrinologist. He has has not received the results of his thyroid biopsy.  He reports some throat soreness and neck tightness. He denies dysphagia or SOB.  He does not have any other past medical history.  He currently smokes one pack per day and does consume alcohol weekly.   Allergies  Allergen Reactions  . Eggs Or Egg-Derived Products Rash   History reviewed. No pertinent past medical history. Current Outpatient Prescriptions on File Prior to Visit  Medication Sig Dispense Refill  . pantoprazole (PROTONIX) 40 MG tablet Take 1 tablet (40 mg total) by mouth 2 (two) times daily. Switch for any other PPI at similar dose and frequency  60 tablet  3  . nicotine (NICODERM CQ - DOSED IN MG/24 HOURS) 14 mg/24hr patch Place 1 patch (14 mg total) onto the skin daily.  28 patch  0   No current facility-administered medications on file prior to visit.   History reviewed. No pertinent family history. History   Social History  . Marital Status: Married    Spouse Name: N/A    Number of Children: N/A  . Years of Education: N/A   Occupational History  . Not on file.   Social History Main Topics  . Smoking status: Current Every Day Smoker -- 1.00 packs/day    Types: Cigarettes  . Smokeless tobacco: Never Used  . Alcohol Use: Yes  . Drug Use: No   . Sexual Activity: Yes   Other Topics Concern  . Not on file   Social History Narrative  . No narrative on file    Review of Systems  Constitutional: Negative.   HENT: Negative.   Eyes: Negative.   Respiratory: Negative.   Cardiovascular: Negative.   Gastrointestinal: Positive for nausea. Negative for vomiting, abdominal pain, diarrhea and constipation.  Genitourinary: Negative.   Musculoskeletal: Negative.   Neurological: Negative.   Psychiatric/Behavioral: Negative.       Objective:   Filed Vitals:   07/10/14 1137  BP: 134/84  Pulse: 64  Temp: 98.5 F (36.9 C)  Resp: 18   Physical Exam  Constitutional: He is oriented to person, place, and time. He appears well-nourished. No distress.  HENT:  Right Ear: External ear normal.  Left Ear: External ear normal.  Mouth/Throat: Oropharynx is clear and moist.  Eyes: Conjunctivae and EOM are normal. Pupils are equal, round, and reactive to light.  Neck: Normal range of motion. Neck supple.  Left neck mass   Cardiovascular: Normal rate, regular rhythm and normal heart sounds.   Pulmonary/Chest: Effort normal and breath sounds normal.  Abdominal: Soft. Bowel sounds are normal. He exhibits no distension. There is no tenderness.  Lymphadenopathy:    He has no cervical adenopathy.  Neurological: He is alert  and oriented to person, place, and time.  Skin: Skin is warm and dry. He is not diaphoretic.     Lab Results  Component Value Date   WBC 10.9* 06/28/2014   HGB 15.7 06/28/2014   HCT 46.7 06/28/2014   MCV 78.5 06/28/2014   PLT 371 06/28/2014   Lab Results  Component Value Date   CREATININE 0.96 06/28/2014   BUN 11 06/28/2014   NA 140 06/28/2014   K 4.4 06/28/2014   CL 101 06/28/2014   CO2 25 06/28/2014    No results found for this basename: HGBA1C   Lipid Panel  No results found for this basename: chol, trig, hdl, cholhdl, vldl, ldlcalc       Assessment and plan:   Dalton Arnold was seen today for establish  care.  Diagnoses and associated orders for this visit:  H. pylori duodenitis Called GI and patient is being treated with Prevpack- amoxicillin, claritomycin, and prevacid.  He is also on dexilant once daily.  Had ENT visit this past week.  Smoker Smoking cessation discussed   Return in about 6 months (around 01/09/2015) for routine.  The patient was given clear instructions to go to ER or return to medical center if symptoms don't improve, worsen or new problems develop. The patient verbalized understanding.  Dalton Arnold, [facility] [phone] 07/10/2014, 11:49 AM

## 2014-07-10 NOTE — Progress Notes (Signed)
Patient presents to establish care HFU for thyroid mass Trying to quit smoking; now smoking .5 ppd. Was smoking 2ppd States under increased stress  Declined flu vaccine; states allergy to egg

## 2014-07-14 ENCOUNTER — Encounter: Payer: Self-pay | Admitting: Endocrinology

## 2014-07-14 ENCOUNTER — Ambulatory Visit (INDEPENDENT_AMBULATORY_CARE_PROVIDER_SITE_OTHER): Payer: BC Managed Care – PPO | Admitting: Endocrinology

## 2014-07-14 VITALS — BP 138/88 | HR 78 | Temp 98.5°F | Resp 12 | Ht 67.0 in | Wt 192.0 lb

## 2014-07-14 DIAGNOSIS — E042 Nontoxic multinodular goiter: Secondary | ICD-10-CM | POA: Diagnosis not present

## 2014-07-14 DIAGNOSIS — Z550 Illiteracy and low-level literacy: Secondary | ICD-10-CM

## 2014-07-14 NOTE — Patient Instructions (Addendum)
Please discuss the shortness of breath with your primary care provider, prior to any surgery.   You should have the left half of the thyroid removed.   If you decide not to have the surgery, please come back here so we can address other options.

## 2014-07-14 NOTE — Progress Notes (Signed)
Subjective:    Patient ID: Dalton Arnold, male    DOB: [birth year], 48 y.o.   MRN: [id number]  HPI In September of 2015, pt was incidentally noted to have a nodule at the left thyroid lobe.  He has slight pain there, but no assoc dysphagia.   No past medical history on file.  Past Surgical History  Procedure Laterality Date  . Left inguinal hernia repair with mesh  05/01/2006  . Hernia repair    . Nasal fracture surgery    . Esophagogastroduodenoscopy N/A 06/27/2014    Procedure: ESOPHAGOGASTRODUODENOSCOPY (EGD);  Surgeon: Beryle Beams, MD;  Location: [facility];  Service: Endoscopy;  Laterality: N/A;    History   Social History  . Marital Status: Married    Spouse Name: N/A    Number of Children: N/A  . Years of Education: N/A   Occupational History  . Not on file.   Social History Main Topics  . Smoking status: Current Every Day Smoker -- 1.00 packs/day    Types: Cigarettes  . Smokeless tobacco: Never Used  . Alcohol Use: Yes  . Drug Use: No  . Sexual Activity: Yes   Other Topics Concern  . Not on file   Social History Narrative  . No narrative on file    Current Outpatient Prescriptions on File Prior to Visit  Medication Sig Dispense Refill  . dexlansoprazole (DEXILANT) 60 MG capsule Take 60 mg by mouth daily.      . pantoprazole (PROTONIX) 40 MG tablet Take 1 tablet (40 mg total) by mouth 2 (two) times daily. Switch for any other PPI at similar dose and frequency  60 tablet  3   No current facility-administered medications on file prior to visit.    Allergies  Allergen Reactions  . Eggs Or Egg-Derived Products Rash  . Nicoderm [Nicotine] Rash    Family History  Problem Relation Age of Onset  . Thyroid disease Neg Hx     BP 138/88  Pulse 78  Temp(Src) 98.5 F (36.9 C) (Oral)  Resp 12  Ht 5\' 7"  (1.702 m)  Wt 192 lb (87.091 kg)  BMI 30.06 kg/m2  SpO2 97%    Review of Systems denies headache, hoarseness, double vision, palpitations,  diarrhea, polyuria, muscle weakness, excessive diaphoresis, numbness, tremor, anxiety, heat intolerance, easy bruising, and rhinorrhea.  He has lost a few lbs.  He has doe.      Objective:   Physical Exam VS: see vs page GEN: no distress HEAD: head: no deformity eyes: no periorbital swelling, no proptosis external nose and ears are normal mouth: no lesion seen NECK: supple, thyroid is not enlarged CHEST WALL: no deformity LUNGS: clear to auscultation BREASTS:  No gynecomastia CV: reg rate and rhythm, no murmur ABD: abdomen is soft, nontender.  no hepatosplenomegaly.  not distended.  no hernia.   MUSCULOSKELETAL: muscle bulk and strength are grossly normal.  no obvious joint swelling.  gait is normal and steady EXTEMITIES: no deformity.  no ulcer on the feet.  feet are of normal color and temp.  no edema PULSES: dorsalis pedis intact bilat.  no carotid bruit NEURO:  cn 2-12 grossly intact.   readily moves all 4's.  sensation is intact to touch on the feet SKIN:  Normal texture and temperature.  No rash or suspicious lesion is visible.   NODES:  None palpable at the neck PSYCH: alert, well-oriented.  Does not appear anxious nor depressed.   i reviewed pathol results, and i  gave pt copies.    Radiol: i reviewed [place] report.    i have reviewed the following old records: Office notes.    i reviewed electrocardiogram (06/26/14).     Assessment & Plan:  Multinodular goiter: in favor of malignancy is the cytology.  Against is the suppressed TSH.  Overall, i advised pt to have surgery.   Illiteracy: i read avs to pt several times, and answered all questions.     Patient is advised the following: Patient Instructions  Please discuss the shortness of breath with your primary care provider, prior to any surgery.   You should have the left half of the thyroid removed.   If you decide not to have the surgery, please come back here so we can address other options.

## 2014-07-18 ENCOUNTER — Other Ambulatory Visit: Payer: BC Managed Care – PPO

## 2014-08-08 NOTE — Progress Notes (Signed)
Please put orders in Epic surgery 08-19-14 pre op 08-14-14 Thanks

## 2014-08-11 NOTE — Progress Notes (Signed)
Spoke with Triage nurse about getting new orders put into Epic and removing the discharge orders for PACU from 06-27-14, surgery 08-19-14 pre op 08-14-14 Thanks

## 2014-08-13 NOTE — Patient Instructions (Addendum)
Dalton Arnold  08/13/2014                           YOUR PROCEDURE IS SCHEDULED ON:  08/19/14                ENTER FROM [address] - ENTER THRU EMERGENCY ENTRANCE                                            FOLLOW  SIGNS TO SHORT STAY CENTER                 ARRIVE AT SHORT STAY AT: 5:30 AM               CALL THIS NUMBER IF ANY PROBLEMS THE DAY OF SURGERY :               [phone]                                REMEMBER:   Do not eat food or drink liquids AFTER MIDNIGHT                  Take these medicines the morning of surgery with               A SIPS OF WATER : none        Do not wear jewelry, make-up   Do not wear lotions, powders, or perfumes.   Do not shave legs or underarms 12 hrs. before surgery (men may shave face)  Do not bring valuables to the hospital.  Contacts, dentures or bridgework may not be worn into surgery.  Leave suitcase in the car. After surgery it may be brought to your room.  For patients admitted to the hospital more than one night, checkout time is            11:00 AM                                                       The day of discharge.   Patients discharged the day of surgery will not be allowed to drive home.            If going home same day of surgery, must have someone stay with you              FIRST 24 hrs at home and arrange for some one to drive you              home from hospital.   ________________________________________________________________________  [place]  Before surgery, you can play an important role.  Because skin is not sterile, your skin needs to be as free of germs as possible.  You can reduce the number of germs on your skin by washing with CHG (chlorahexidine gluconate) soap before surgery.  CHG is an antiseptic cleaner which kills germs and bonds with the skin to  continue killing germs even after washing. Please DO NOT use if you have an allergy to CHG or antibacterial soaps.  If your skin becomes reddened/irritated stop using the CHG and inform your nurse when you arrive at Short Stay. Do not shave (including legs and underarms) for at least 48 hours prior to the first CHG shower.  You may shave your face. Please follow these instructions carefully:   1.  Shower with CHG Soap the night before surgery and the  morning of Surgery.   2.  If you choose to wash your hair, wash your hair first as usual with your  normal  Shampoo.   3.  After you shampoo, rinse your hair and body thoroughly to remove the  shampoo.                                         4.  Use CHG as you would any other liquid soap.  You can apply chg directly  to the skin and wash . Gently wash with scrungie or clean wascloth    5.  Apply the CHG Soap to your body ONLY FROM THE NECK DOWN.   Do not use on open                           Wound or open sores. Avoid contact with eyes, ears mouth and genitals (private parts).                        Genitals (private parts) with your normal soap.              6.  Wash thoroughly, paying special attention to the area where your surgery  will be performed.   7.  Thoroughly rinse your body with warm water from the neck down.   8.  DO NOT shower/wash with your normal soap after using and rinsing off  the CHG Soap .                9.  Pat yourself dry with a clean towel.             10.  Wear clean pajamas.             11.  Place clean sheets on your bed the night of your first shower and do not  sleep with pets.  Day of Surgery : Do not apply any lotions/deodorants the morning of surgery.  Please wear clean clothes to the hospital/surgery center.  FAILURE TO FOLLOW THESE INSTRUCTIONS MAY RESULT IN THE CANCELLATION OF YOUR SURGERY    PATIENT  SIGNATURE_________________________________  ______________________________________________________________________

## 2014-08-14 ENCOUNTER — Encounter (HOSPITAL_COMMUNITY): Payer: Self-pay

## 2014-08-14 ENCOUNTER — Encounter (HOSPITAL_COMMUNITY)
Admission: RE | Admit: 2014-08-14 | Discharge: 2014-08-14 | Disposition: A | Discharge status: Home / Self Care | Payer: BC Managed Care – PPO | Source: Ambulatory Visit | Attending: Otolaryngology | Admitting: Otolaryngology

## 2014-08-14 DIAGNOSIS — Z01812 Encounter for preprocedural laboratory examination: Secondary | ICD-10-CM | POA: Insufficient documentation

## 2014-08-14 HISTORY — DX: Disorder of thyroid, unspecified: E07.9

## 2014-08-14 HISTORY — DX: Diarrhea, unspecified: R19.7

## 2014-08-14 LAB — CBC
HEMATOCRIT: 43.5 % (ref 39.0–52.0)
Hemoglobin: 14.8 g/dL (ref 13.0–17.0)
MCH: 26.7 pg (ref 26.0–34.0)
MCHC: 34 g/dL (ref 30.0–36.0)
MCV: 78.5 fL (ref 78.0–100.0)
PLATELETS: 365 10*3/uL (ref 150–400)
RBC: 5.54 MIL/uL (ref 4.22–5.81)
RDW: 14.5 % (ref 11.5–15.5)
WBC: 10.9 10*3/uL — ABNORMAL HIGH (ref 4.0–10.5)

## 2014-08-14 LAB — BASIC METABOLIC PANEL
ANION GAP: 14 (ref 5–15)
BUN: 11 mg/dL (ref 6–23)
CALCIUM: 9.4 mg/dL (ref 8.4–10.5)
CO2: 23 mEq/L (ref 19–32)
Chloride: 101 mEq/L (ref 96–112)
Creatinine, Ser: 0.71 mg/dL (ref 0.50–1.35)
GFR calc Af Amer: >90 mL/min (ref >90)
Glucose, Bld: 82 mg/dL (ref 70–99)
Potassium: 4.1 mEq/L (ref 3.7–5.3)
Sodium: 138 mEq/L (ref 137–147)

## 2014-08-14 NOTE — Progress Notes (Signed)
Dr. Lucia Gaskins : we need orders in [facility] please - pt came for preop Thurs 08/14/14 - Thank You

## 2014-08-18 ENCOUNTER — Other Ambulatory Visit: Payer: Self-pay | Admitting: Otolaryngology

## 2014-08-18 NOTE — H&P (Signed)
PREOPERATIVE H&P  Chief Complaint: left thyroid mass  HPI: Dalton Arnold is a 48 y.o. male who presents for evaluation of left thyroid mass that was discovered on a CT scan. 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 He's taken to the OR for left thyroid lobectomy. No voice change.  Past Medical History  Diagnosis Date  . Diarrhea   . Thyroid mass    Past Surgical History  Procedure Laterality Date  . Nasal fracture surgery    . Esophagogastroduodenoscopy N/A 06/27/2014    Procedure: ESOPHAGOGASTRODUODENOSCOPY (EGD);  Surgeon: Beryle Beams, MD;  Location: [facility];  Service: Endoscopy;  Laterality: N/A;  . Hernia repair      x2   History   Social History  . Marital Status: Married    Spouse Name: N/A    Number of Children: N/A  . Years of Education: N/A   Social History Main Topics  . Smoking status: Current Every Day Smoker -- 1.00 packs/day    Types: Cigarettes  . Smokeless tobacco: Never Used  . Alcohol Use: Yes  . Drug Use: No  . Sexual Activity: Yes   Other Topics Concern  . Not on file   Social History Narrative   Family History  Problem Relation Age of Onset  . Thyroid disease Neg Hx    Allergies  Allergen Reactions  . Acyclovir And Related   . Eggs Or Egg-Derived Products Rash  . Nicoderm [Nicotine] Rash   Prior to Admission medications   Medication Sig Start Date End Date Taking? Authorizing Provider  amoxicillin (AMOXIL) 500 MG capsule Take 1,000 mg by mouth 2 (two) times daily.  07/07/14   Historical Provider, MD  clarithromycin (BIAXIN) 500 MG tablet Take 500 mg by mouth daily.  07/07/14   Historical Provider, MD  dexlansoprazole (DEXILANT) 60 MG capsule Take 60 mg by mouth 2 (two) times daily.     Historical Provider, MD  pantoprazole (PROTONIX) 40 MG tablet Take 1 tablet (40 mg total) by mouth 2 (two) times daily. Switch for any other PPI at similar dose and frequency 06/27/14    Thurnell Lose, MD     Positive ROS: No hoarseness or trouble swallowing  All other systems have been reviewed and were otherwise negative with the exception of those mentioned in the HPI and as above.  Physical Exam: There were no vitals filed for this visit.  General: Alert, no acute distress Oral: Normal oral mucosa and tonsils Nasal: Clear nasal passages Neck: No palpable adenopathy on either side. Slight fullness left inferior thyroid. Ear: Ear canal is clear with normal appearing TMs Cardiovascular: Regular rate and rhythm, no murmur.  Respiratory: Clear to auscultation Neurologic: Alert and oriented x 3   Assessment/Plan: THYROID MASS Left Plan for Procedure(s): LEFT THYROID LOBECTOMY   Melony Overly, MD 08/18/2014 3:24 PM Left

## 2014-08-18 NOTE — Anesthesia Preprocedure Evaluation (Addendum)
Anesthesia Evaluation  Patient identified by MRN, date of birth, ID band Patient awake    Reviewed: Allergy & Precautions, H&P , NPO status , Patient's Chart, lab work & pertinent test results  Airway Mallampati: II  TM Distance: >3 FB Neck ROM: full    Dental no notable dental hx. (+) Teeth Intact, Dental Advisory Given   Pulmonary Current Smoker,  breath sounds clear to auscultation  Pulmonary exam normal       Cardiovascular Exercise Tolerance: Good negative cardio ROS  Rhythm:regular Rate:Normal     Neuro/Psych negative neurological ROS  negative psych ROS   GI/Hepatic negative GI ROS, (+)     substance abuse  alcohol use, Acute pancreatitis   Endo/Other  negative endocrine ROS  Renal/GU negative Renal ROS  negative genitourinary   Musculoskeletal   Abdominal   Peds  Hematology negative hematology ROS (+)   Anesthesia Other Findings   Reproductive/Obstetrics negative OB ROS                            Anesthesia Physical Anesthesia Plan  ASA: III  Anesthesia Plan: General   Post-op Pain Management:    Induction: Intravenous  Airway Management Planned: Oral ETT  Additional Equipment:   Intra-op Plan:   Post-operative Plan: Extubation in OR  Informed Consent: I have reviewed the patients History and Physical, chart, labs and discussed the procedure including the risks, benefits and alternatives for the proposed anesthesia with the patient or authorized representative who has indicated his/her understanding and acceptance.   Dental Advisory Given  Plan Discussed with: CRNA and Surgeon  Anesthesia Plan Comments:         Anesthesia Quick Evaluation

## 2014-08-19 ENCOUNTER — Encounter (HOSPITAL_COMMUNITY): Payer: Self-pay

## 2014-08-19 ENCOUNTER — Observation Stay (HOSPITAL_COMMUNITY)
Admission: RE | Admit: 2014-08-19 | Discharge: 2014-08-20 | Disposition: A | Discharge status: Home / Self Care | Payer: BC Managed Care – PPO | Source: Ambulatory Visit | Attending: Otolaryngology | Admitting: Otolaryngology

## 2014-08-19 ENCOUNTER — Encounter (HOSPITAL_COMMUNITY)
Admission: RE | Disposition: A | Discharge status: Home / Self Care | Payer: Self-pay | Source: Ambulatory Visit | Attending: Otolaryngology

## 2014-08-19 ENCOUNTER — Inpatient Hospital Stay (HOSPITAL_COMMUNITY): Payer: BC Managed Care – PPO | Admitting: Anesthesiology

## 2014-08-19 DIAGNOSIS — F17200 Nicotine dependence, unspecified, uncomplicated: Secondary | ICD-10-CM | POA: Diagnosis not present

## 2014-08-19 DIAGNOSIS — D34 Benign neoplasm of thyroid gland: Secondary | ICD-10-CM | POA: Diagnosis present

## 2014-08-19 DIAGNOSIS — E079 Disorder of thyroid, unspecified: Secondary | ICD-10-CM

## 2014-08-19 DIAGNOSIS — Z79899 Other long term (current) drug therapy: Secondary | ICD-10-CM | POA: Insufficient documentation

## 2014-08-19 DIAGNOSIS — C73 Malignant neoplasm of thyroid gland: Secondary | ICD-10-CM | POA: Diagnosis not present

## 2014-08-19 HISTORY — PX: THYROIDECTOMY: SHX17

## 2014-08-19 SURGERY — THYROIDECTOMY
Anesthesia: General | Site: Neck | Laterality: Left

## 2014-08-19 MED ORDER — FENTANYL CITRATE 0.05 MG/ML IJ SOLN
INTRAMUSCULAR | Status: DC | PRN
  Administered 2014-08-19: 100 ug via INTRAVENOUS
  Administered 2014-08-19 (×2): 50 ug via INTRAVENOUS

## 2014-08-19 MED ORDER — NAPROXEN SODIUM 220 MG PO TABS: 220.0000 mg | ORAL_TABLET | Freq: Two times a day (BID) | ORAL | Status: DC

## 2014-08-19 MED ORDER — HYDROMORPHONE HCL 1 MG/ML IJ SOLN
INTRAMUSCULAR | Status: AC
  Filled 2014-08-19: qty 1

## 2014-08-19 MED ORDER — NAPROXEN 250 MG PO TABS
250.0000 mg | ORAL_TABLET | Freq: Two times a day (BID) | ORAL | Status: DC
  Administered 2014-08-19 – 2014-08-20 (×2): 250 mg via ORAL
  Filled 2014-08-19 (×4): qty 1

## 2014-08-19 MED ORDER — ROCURONIUM BROMIDE 100 MG/10ML IV SOLN
INTRAVENOUS | Status: DC | PRN
  Administered 2014-08-19: 40 mg via INTRAVENOUS
  Administered 2014-08-19 (×2): 10 mg via INTRAVENOUS

## 2014-08-19 MED ORDER — GLYCOPYRROLATE 0.2 MG/ML IJ SOLN
INTRAMUSCULAR | Status: DC | PRN
  Administered 2014-08-19: 0.6 mg via INTRAVENOUS

## 2014-08-19 MED ORDER — ONDANSETRON HCL 4 MG/2ML IJ SOLN: 4.0000 mg | INTRAMUSCULAR | Status: DC | PRN

## 2014-08-19 MED ORDER — PROPOFOL 10 MG/ML IV BOLUS
INTRAVENOUS | Status: DC | PRN
  Administered 2014-08-19: 200 mg via INTRAVENOUS

## 2014-08-19 MED ORDER — SUCCINYLCHOLINE CHLORIDE 20 MG/ML IJ SOLN
INTRAMUSCULAR | Status: DC | PRN
  Administered 2014-08-19: 100 mg via INTRAVENOUS

## 2014-08-19 MED ORDER — CEFAZOLIN SODIUM 1-5 GM-% IV SOLN
1.0000 g | Freq: Three times a day (TID) | INTRAVENOUS | Status: DC
  Administered 2014-08-19 – 2014-08-20 (×3): 1 g via INTRAVENOUS
  Filled 2014-08-19 (×4): qty 50

## 2014-08-19 MED ORDER — BACITRACIN ZINC 500 UNIT/GM EX OINT
1.0000 "application " | TOPICAL_OINTMENT | Freq: Three times a day (TID) | CUTANEOUS | Status: DC
  Administered 2014-08-19 – 2014-08-20 (×2): 1 via TOPICAL
  Filled 2014-08-19: qty 28.35

## 2014-08-19 MED ORDER — LACTATED RINGERS IV SOLN: INTRAVENOUS | Status: DC

## 2014-08-19 MED ORDER — BACITRACIN ZINC 500 UNIT/GM EX OINT
TOPICAL_OINTMENT | CUTANEOUS | Status: AC
  Filled 2014-08-19: qty 28.35

## 2014-08-19 MED ORDER — MIDAZOLAM HCL 5 MG/5ML IJ SOLN
INTRAMUSCULAR | Status: DC | PRN
  Administered 2014-08-19: 2 mg via INTRAVENOUS

## 2014-08-19 MED ORDER — 0.9 % SODIUM CHLORIDE (POUR BTL) OPTIME
TOPICAL | Status: DC | PRN
  Administered 2014-08-19: 1000 mL

## 2014-08-19 MED ORDER — LIDOCAINE-EPINEPHRINE 1 %-1:100000 IJ SOLN
INTRAMUSCULAR | Status: AC
  Filled 2014-08-19: qty 1

## 2014-08-19 MED ORDER — ACETAMINOPHEN 650 MG RE SUPP: 650.0000 mg | RECTAL | Status: DC | PRN

## 2014-08-19 MED ORDER — LIDOCAINE HCL (CARDIAC) 20 MG/ML IV SOLN
INTRAVENOUS | Status: DC | PRN
  Administered 2014-08-19: 100 mg via INTRAVENOUS

## 2014-08-19 MED ORDER — HYDROMORPHONE HCL 1 MG/ML IJ SOLN
0.2500 mg | INTRAMUSCULAR | Status: DC | PRN
Start: 2014-08-19 — End: 2014-08-19
  Administered 2014-08-19: 0.5 mg via INTRAVENOUS
  Administered 2014-08-19 (×2): 0.25 mg via INTRAVENOUS
  Administered 2014-08-19 (×2): 0.5 mg via INTRAVENOUS

## 2014-08-19 MED ORDER — ACETAMINOPHEN 160 MG/5ML PO SOLN: 650.0000 mg | ORAL | Status: DC | PRN

## 2014-08-19 MED ORDER — ONDANSETRON HCL 4 MG/2ML IJ SOLN
INTRAMUSCULAR | Status: DC | PRN
  Administered 2014-08-19: 4 mg via INTRAVENOUS

## 2014-08-19 MED ORDER — DEXAMETHASONE SODIUM PHOSPHATE 10 MG/ML IJ SOLN
INTRAMUSCULAR | Status: DC | PRN
  Administered 2014-08-19: 10 mg via INTRAVENOUS

## 2014-08-19 MED ORDER — PANTOPRAZOLE SODIUM 40 MG PO TBEC
40.0000 mg | DELAYED_RELEASE_TABLET | Freq: Two times a day (BID) | ORAL | Status: DC
  Administered 2014-08-19 – 2014-08-20 (×3): 40 mg via ORAL
  Filled 2014-08-19 (×4): qty 1

## 2014-08-19 MED ORDER — ONDANSETRON HCL 4 MG PO TABS: 4.0000 mg | ORAL_TABLET | ORAL | Status: DC | PRN

## 2014-08-19 MED ORDER — MORPHINE SULFATE 2 MG/ML IJ SOLN
2.0000 mg | INTRAMUSCULAR | Status: DC | PRN
  Administered 2014-08-19 (×3): 4 mg via INTRAVENOUS
  Filled 2014-08-19 (×3): qty 2

## 2014-08-19 MED ORDER — LIDOCAINE-EPINEPHRINE 1 %-1:100000 IJ SOLN
INTRAMUSCULAR | Status: DC | PRN
  Administered 2014-08-19: 8 mL

## 2014-08-19 MED ORDER — NEOSTIGMINE METHYLSULFATE 10 MG/10ML IV SOLN
INTRAVENOUS | Status: DC | PRN
  Administered 2014-08-19: 5 mg via INTRAVENOUS

## 2014-08-19 MED ORDER — CEFAZOLIN SODIUM-DEXTROSE 2-3 GM-% IV SOLR
2.0000 g | INTRAVENOUS | Status: AC
  Administered 2014-08-19: 2 g via INTRAVENOUS

## 2014-08-19 MED ORDER — LACTATED RINGERS IV SOLN
INTRAVENOUS | Status: DC | PRN
  Administered 2014-08-19 (×2): via INTRAVENOUS

## 2014-08-19 MED ORDER — HYDROCODONE-ACETAMINOPHEN 5-325 MG PO TABS
1.0000 | ORAL_TABLET | ORAL | Status: DC | PRN
  Administered 2014-08-19 – 2014-08-20 (×4): 2 via ORAL
  Filled 2014-08-19 (×4): qty 2

## 2014-08-19 MED ORDER — KCL IN DEXTROSE-NACL 20-5-0.45 MEQ/L-%-% IV SOLN
INTRAVENOUS | Status: DC
  Administered 2014-08-19: 100 mL/h via INTRAVENOUS
  Filled 2014-08-19 (×4): qty 1000

## 2014-08-19 SURGICAL SUPPLY — 50 items
ATTRACTOMAT 16X20 MAGNETIC DRP (DRAPES) ×3 IMPLANT
BENZOIN TINCTURE PRP APPL 2/3 (GAUZE/BANDAGES/DRESSINGS) IMPLANT
BLADE HEX COATED 2.75 (ELECTRODE) IMPLANT
BLADE SURG 15 STRL LF DISP TIS (BLADE) ×1 IMPLANT
BLADE SURG 15 STRL SS (BLADE) ×2
CANISTER SUCTION 2500CC (MISCELLANEOUS) IMPLANT
CLIP TI MEDIUM 6 (CLIP) ×6 IMPLANT
CLIP TI WIDE RED SMALL 6 (CLIP) ×6 IMPLANT
CLOSURE WOUND 1/2 X4 (GAUZE/BANDAGES/DRESSINGS)
CORDS BIPOLAR (ELECTRODE) ×3 IMPLANT
DISSECTOR ROUND CHERRY 3/8 STR (MISCELLANEOUS) ×3 IMPLANT
DRAIN PENROSE 18X1/4 LTX STRL (WOUND CARE) ×3 IMPLANT
DRAPE PED LAPAROTOMY (DRAPES) ×3 IMPLANT
ELECT COATED BLADE 2.86 ST (ELECTRODE) ×3 IMPLANT
ELECT REM PT RETURN 9FT ADLT (ELECTROSURGICAL) ×3
ELECTRODE REM PT RTRN 9FT ADLT (ELECTROSURGICAL) ×1 IMPLANT
GAUZE SPONGE 4X4 12PLY STRL (GAUZE/BANDAGES/DRESSINGS) ×3 IMPLANT
GAUZE SPONGE 4X4 16PLY XRAY LF (GAUZE/BANDAGES/DRESSINGS) ×6 IMPLANT
GLOVE BIO SURGEON STRL SZ7 (GLOVE) ×6 IMPLANT
GLOVE BIOGEL PI IND STRL 7.0 (GLOVE) ×2 IMPLANT
GLOVE BIOGEL PI INDICATOR 7.0 (GLOVE) ×4
GLOVE SURG SIGNA 7.5 PF LTX (GLOVE) ×3 IMPLANT
GLOVE SURG SS PI 7.5 STRL IVOR (GLOVE) ×3 IMPLANT
GOWN SPEC L4 XLG W/TWL (GOWN DISPOSABLE) IMPLANT
GOWN STRL REUS W/ TWL XL LVL3 (GOWN DISPOSABLE) ×3 IMPLANT
GOWN STRL REUS W/TWL LRG LVL3 (GOWN DISPOSABLE) IMPLANT
GOWN STRL REUS W/TWL XL LVL3 (GOWN DISPOSABLE) ×6
HEMOSTAT SNOW SURGICEL 2X4 (HEMOSTASIS) ×3 IMPLANT
HEMOSTAT SURGICEL 2X14 (HEMOSTASIS) IMPLANT
KIT BASIN OR (CUSTOM PROCEDURE TRAY) ×3 IMPLANT
NS IRRIG 1000ML POUR BTL (IV SOLUTION) ×3 IMPLANT
PACK BASIC VI WITH GOWN DISP (CUSTOM PROCEDURE TRAY) ×3 IMPLANT
PENCIL BUTTON HOLSTER BLD 10FT (ELECTRODE) ×3 IMPLANT
STAPLER VISISTAT 35W (STAPLE) ×3 IMPLANT
STRIP CLOSURE SKIN 1/2X4 (GAUZE/BANDAGES/DRESSINGS) IMPLANT
SUT CHROMIC 3 0 PS 2 (SUTURE) ×3 IMPLANT
SUT ETHILON 5 0 PS 2 18 (SUTURE) ×6 IMPLANT
SUT MON AB 5-0 PS2 18 (SUTURE) ×3 IMPLANT
SUT SILK 2 0 (SUTURE) ×4
SUT SILK 2-0 18XBRD TIE 12 (SUTURE) ×2 IMPLANT
SUT SILK 3 0 (SUTURE) ×2
SUT SILK 3 0 SH CR/8 (SUTURE) IMPLANT
SUT SILK 3-0 18XBRD TIE 12 (SUTURE) ×1 IMPLANT
SUT VIC AB 0 CT1 36 (SUTURE) IMPLANT
SUT VIC AB 3-0 SH 18 (SUTURE) ×3 IMPLANT
SYR BULB IRRIGATION 50ML (SYRINGE) ×3 IMPLANT
TAPE CLOTH SURG 4X10 WHT LF (GAUZE/BANDAGES/DRESSINGS) ×3 IMPLANT
TOWEL OR 17X26 10 PK STRL BLUE (TOWEL DISPOSABLE) ×3 IMPLANT
WATER STERILE IRR 1500ML POUR (IV SOLUTION) IMPLANT
YANKAUER SUCT BULB TIP 10FT TU (MISCELLANEOUS) ×3 IMPLANT

## 2014-08-19 NOTE — Anesthesia Postprocedure Evaluation (Signed)
Anesthesia Post Note  Patient: Dalton Arnold  Procedure(s) Performed: Procedure(s) (LRB): LEFT THYROID LOBECTOMY (Left)  Anesthesia type: General  Patient location: PACU  Post pain: Pain level controlled  Post assessment: Post-op Vital signs reviewed  Last Vitals: BP 148/88 mmHg  Pulse 65  Temp(Src) 36.7 C (Oral)  Resp 12  Ht 5\' 7"  (1.702 m)  Wt 196 lb (88.905 kg)  BMI 30.69 kg/m2  SpO2 94%  Post vital signs: Reviewed  Level of consciousness: sedated  Complications: No apparent anesthesia complications

## 2014-08-19 NOTE — Progress Notes (Signed)
Patient does not read [place]. Consent read to him by friend, Vanita Ingles

## 2014-08-19 NOTE — Progress Notes (Signed)
Post Op check Awake alert  AF VSS Neck Dressing changed at bedside Penrose drain intact. No active bleeding noted. No neck swelling Voice is OK Complains of pain with swallowing. Stable post op course Will plan on removing penrose drain tomorrow and dc home.

## 2014-08-19 NOTE — Progress Notes (Signed)
Patient states he has had flu fri, sat and sun. Had fever, chills, sweats , no energy and aches. States took [employer] and thera-flu on sat and sun. Temp broke Sun. Has not notified doc or go to an urgent ccare Feels like he is up to going through surgery. Wants to be evaluated by MD and Doc this am

## 2014-08-19 NOTE — Transfer of Care (Signed)
Immediate Anesthesia Transfer of Care Note  Patient: Dalton Arnold  Procedure(s) Performed: Procedure(s): LEFT THYROID LOBECTOMY (Left)  Patient Location: PACU  Anesthesia Type:General  Level of Consciousness: sedated  Airway & Oxygen Therapy: Patient Spontanous Breathing and Patient connected to face mask oxygen  Post-op Assessment: Report given to PACU RN and Post -op Vital signs reviewed and stable  Post vital signs: Reviewed and stable  Complications: No apparent anesthesia complications

## 2014-08-19 NOTE — Op Note (Signed)
NAMEANTHONI, Arnold NO.:  [id number]  MEDICAL RECORD NO.:  [id number]  LOCATION:  20                         FACILITY:  [facility]  PHYSICIAN:  Leonides Sake. 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  OPERATION:  Left thyroid lobectomy.  SURGEON:  Leonides Sake. Lucia Gaskins, MD  ASSISTANT SURGEON:  Minna Merritts, MD  ANESTHESIA:  General endotracheal.  COMPLICATIONS:  None.  ESTIMATED BLOOD LOSS:  Less than 50 mL.  BRIEF CLINICAL NOTE:  Dalton Arnold is a 48 year old gentleman who was found to have a left thyroid mass on a chest CT scan.  He initially had 2 nodules in the left thyroid lobe.  The larger nodule measuring 4 x 3.5 cm involving the lower pole of the left thyroid lobe, and then a smaller 2-2.5 cm upper pole nodule.  Fine needle aspirate of the left thyroid nodule the large one showed atypical follicular epithelial cells suspicious for malignancy.  He was taken to the operating room at this time for a left thyroid lobectomy.  DESCRIPTION OF PROCEDURE:  The patient was brought to the operating room at [facility].  Discussed extensively with him concerning left thyroid lobectomy and the risk of hoarseness as well as risk of bleeding and infection.  He underwent general endotracheal anesthesia without any difficulty.  Neck was prepped with Betadine solution and draped out with sterile towels.  The proposed incision site just above the clavicles was marked and injected with 8 mL of Xylocaine with epinephrine for hemostasis and local anesthetic.  A standard thyroid incision was made just above the manubrium clavicles.   On CT scan, the larger lower left thyroid lobe mass was extended deep to the manubrium.  Sharp incision was made and the subcutaneous tissue was incised with cautery. Hemostasis was obtained with cautery.  The strap muscles were identified and divided in midline.  They were retracted laterally to expose the thyroid isthmus.  The thyroid isthmus was divided just left of midline with cautery.  Next, the left thyroid lobe was palpated, has a larger soft lower lobe mass and a smaller little bit firmer upper lobe mass. The lateral veins were ligated with 2-0 silk sutures and divided.  The strap muscles were dissected off the left thyroid lobe.  The superior thyroid artery was identified and was ligated with 2-0 silk suture as was the inferior thyroid artery was identified and ligated with 2-0 silk suture.  Dissection was carried out on the thyroid gland.  The parathyroid gland was preserved inferiorly, really did not identify the superior parathyroid gland on the left side and left a small remnant of superior left thyroid tissue just above the 2-2.5 cm upper thyroid nodule.  The entire thyroid nodule was removed as was the larger lower left thyroid lobe nodule.  After controlling the bleeding with ligatures, the thyroid lobe was removed and sent as a specimen as  the left thyroid lobe.  Hemostasis was obtained with bipolar cautery and 2-0 silk ligatures.  Following removal of the thyroid lobe, there was a small oozing that was controlled with bipolar cautery and snow type [place].  The defect was closed with 3-0 chromic sutures subcutaneously and reapproximating the strap muscles at the midline.  A single quarter- inch Penrose drain was brought through this midline incision and secured with a 5-0 silk suture.  The skin edges were reapproximated with 5-0 nylon suture.  Bacitracin ointment, dressing was applied.  The patient was awakened from anesthesia and transferred to recovery room  postop doing well.  DISPOSITION:  The patient will be observed overnight observation.  We will plan on removing the Penrose drain in the morning and discharging him home and have him follow up in the office in 1 week for recheck and review final pathology and have sutures removed.          ______________________________ Leonides Sake Lucia Gaskins, M.D.     CEN/MEDQ  D:  08/19/2014  T:  08/19/2014  Job:  [id number]  cc:   Hilliard Clark A. Loanne Drilling, MD  Minna Merritts, M.D. Fax: [phone]

## 2014-08-19 NOTE — Interval H&P Note (Signed)
History and Physical Interval Note:  08/19/2014 7:31 AM  Dalton Arnold  has presented today for surgery, with the diagnosis of THYROID MASS  The various methods of treatment have been discussed with the patient and family. After consideration of risks, benefits and other options for treatment, the patient has consented to  Procedure(s): LEFT THYROID LOBECTOMY (Left) as a surgical intervention .  The patient's history has been reviewed, patient examined, no change in status, stable for surgery.  I have reviewed the patient's chart and labs.  Questions were answered to the patient's satisfaction.     [name]

## 2014-08-19 NOTE — Brief Op Note (Signed)
08/19/2014  10:18 AM  PATIENT:  Dalton Arnold  48 y.o. male  PRE-OPERATIVE DIAGNOSIS:  THYROID MASS  POST-OPERATIVE DIAGNOSIS:  THYROID MASS  PROCEDURE:  Procedure(s): LEFT THYROID LOBECTOMY (Left)  SURGEON:  Surgeon(s) and Role:    * Rozetta Nunnery, MD - Primary    * Thornell Sartorius, MD - Assisting  PHYSICIAN ASSISTANT:   ASSISTANTS: Minna Merritts   ANESTHESIA:   general  EBL:  Total I/O In: 1000 [I.V.:1000] Out: -   BLOOD ADMINISTERED:none  DRAINS: Penrose drain in the neck   LOCAL MEDICATIONS USED:  LIDOCAINE with EPI  8 cc  SPECIMEN:  Source of Specimen:  left thyroid lobe  DISPOSITION OF SPECIMEN:  PATHOLOGY  COUNTS:  YES  TOURNIQUET:  * No tourniquets in log *  DICTATION: .Other Dictation: Dictation Number [id number]  PLAN OF CARE: Admit for overnight observation  PATIENT DISPOSITION:  PACU - hemodynamically stable.   Delay start of Pharmacological VTE agent (>24hrs) due to surgical blood loss or risk of bleeding: yes

## 2014-08-20 DIAGNOSIS — C73 Malignant neoplasm of thyroid gland: Secondary | ICD-10-CM | POA: Diagnosis not present

## 2014-08-20 MED ORDER — HYDROCODONE-ACETAMINOPHEN 5-325 MG PO TABS: 1.0000 | ORAL_TABLET | Freq: Four times a day (QID) | ORAL | Status: DC | PRN

## 2014-08-20 NOTE — Progress Notes (Signed)
POD 1   Discharge AF VSS Minimal drainage from penrose. This was removed. Good voice. Mild soreness on swallowing. No significant neck swelling. Discharge home     Dictated [phone]

## 2014-08-20 NOTE — Discharge Instructions (Signed)
Keep incision site clean. Can apply antibiotic ointment daily. Keep dressing in place for 14 hrs.  Can then change as needed. OK to get incision site wet in 24 hrs Call Dr Lucia Gaskins if you have any questions   [phone] Take your regular meds  And either tylenol, motrin or hydrocodone prn pain. Return to see Dr Lucia Gaskins next Monday at 4:15

## 2014-08-20 NOTE — Progress Notes (Signed)
UR completed 

## 2014-08-20 NOTE — Discharge Summary (Signed)
Dalton Arnold, Dalton Arnold NO.:  [id number]  MEDICAL RECORD NO.:  [id number]  LOCATION:  61                         FACILITY:  [facility]  PHYSICIAN:  Leonides Sake. 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  OPERATION DURING THIS HOSPITALIZATION:  Total left thyroid lobectomy on August 19, 2014.  HOSPITAL COURSE:  The patient was admitted following a total left thyroid lobectomy for left parotid mass performed on August 19, 2014. He was admitted for overnight observation.  He has had a Penrose drain in place, complained of some soreness when he swallowed, but otherwise no airway problems.  Voice was good.  He had minimal drainage from the Penrose drain in the morning and the Penrose drain was removed on his first postoperative day in the morning on August 20, 2014.  He had no significant neck swelling, voice was good and had little bit of soreness with swallowing as expected.  After removing the Penrose drain, bacitracin ointment and dressing was applied.  The patient was subsequently discharged to home on his regular medications along with some hydrocodone for pain.  We will have him follow up in my office in 5 days to review final pathology, have sutures removed and wound recheck. He was instructed to keep the dressing in place and the wound dry for the next 24 hours and then can remove the dressing, get incision wet and apply bacitracin ointment to the incision and will follow up in my office in 5 days, have sutures removed and review of final pathology. He was given hydrocodone 5/325 mg tablets 1-2 q.6 hours p.r.n. pain along with either Tylenol or Motrin.  FINAL DIAGNOSIS:  Left thyroid mass, questionable neoplasm.          ______________________________ Leonides Sake Lucia Gaskins,  M.D.     CEN/MEDQ  D:  08/20/2014  T:  08/20/2014  Job:  [id number]

## 2014-08-21 ENCOUNTER — Encounter (HOSPITAL_COMMUNITY): Payer: Self-pay | Admitting: Otolaryngology

## 2014-08-21 NOTE — Addendum Note (Signed)
Addendum  created 08/21/14 1202 by Peyton Najjar, MD   Modules edited: Anesthesia Attestations

## 2014-12-31 ENCOUNTER — Encounter: Payer: Self-pay | Admitting: Internal Medicine

## 2014-12-31 ENCOUNTER — Ambulatory Visit (HOSPITAL_COMMUNITY)
Admission: RE | Admit: 2014-12-31 | Discharge: 2014-12-31 | Disposition: A | Discharge status: Home / Self Care | Payer: BLUE CROSS/BLUE SHIELD | Source: Ambulatory Visit | Attending: Cardiology | Admitting: Cardiology

## 2014-12-31 ENCOUNTER — Other Ambulatory Visit: Payer: Self-pay

## 2014-12-31 ENCOUNTER — Ambulatory Visit: Payer: BLUE CROSS/BLUE SHIELD | Attending: Internal Medicine | Admitting: Internal Medicine

## 2014-12-31 VITALS — BP 132/82 | HR 72 | Temp 98.8°F | Resp 16 | Ht 67.0 in | Wt 197.0 lb

## 2014-12-31 DIAGNOSIS — K429 Umbilical hernia without obstruction or gangrene: Secondary | ICD-10-CM | POA: Diagnosis not present

## 2014-12-31 DIAGNOSIS — R079 Chest pain, unspecified: Secondary | ICD-10-CM | POA: Insufficient documentation

## 2014-12-31 DIAGNOSIS — K219 Gastro-esophageal reflux disease without esophagitis: Secondary | ICD-10-CM | POA: Diagnosis not present

## 2014-12-31 MED ORDER — PANTOPRAZOLE SODIUM 40 MG PO TBEC: 40.0000 mg | DELAYED_RELEASE_TABLET | Freq: Every day | ORAL | Status: DC

## 2014-12-31 NOTE — Patient Instructions (Signed)
Gastroesophageal Reflux Disease, Adult Gastroesophageal reflux disease (GERD) happens when acid from your stomach flows up into the esophagus. When acid comes in contact with the esophagus, the acid causes soreness (inflammation) in the esophagus. Over time, GERD may create small holes (ulcers) in the lining of the esophagus. CAUSES   Increased body weight. This puts pressure on the stomach, making acid rise from the stomach into the esophagus.  Smoking. This increases acid production in the stomach.  Drinking alcohol. This causes decreased pressure in the lower esophageal sphincter (valve or ring of muscle between the esophagus and stomach), allowing acid from the stomach into the esophagus.  Late evening meals and a full stomach. This increases pressure and acid production in the stomach.  A malformed lower esophageal sphincter. Sometimes, no cause is found. SYMPTOMS   Burning pain in the lower part of the mid-chest behind the breastbone and in the mid-stomach area. This may occur twice a week or more often.  Trouble swallowing.  Sore throat.  Dry cough.  Asthma-like symptoms including chest tightness, shortness of breath, or wheezing. DIAGNOSIS  Your caregiver may be able to diagnose GERD based on your symptoms. In some cases, X-rays and other tests may be done to check for complications or to check the condition of your stomach and esophagus. TREATMENT  Your caregiver may recommend over-the-counter or prescription medicines to help decrease acid production. Ask your caregiver before starting or adding any new medicines.  HOME CARE INSTRUCTIONS   Change the factors that you can control. Ask your caregiver for guidance concerning weight loss, quitting smoking, and alcohol consumption.  Avoid foods and drinks that make your symptoms worse, such as:  Caffeine or alcoholic drinks.  Chocolate.  Peppermint or mint flavorings.  Garlic and onions.  Spicy foods.  Citrus fruits,  such as oranges, lemons, or limes.  Tomato-based foods such as sauce, chili, salsa, and pizza.  Fried and fatty foods.  Avoid lying down for the 3 hours prior to your bedtime or prior to taking a nap.  Eat small, frequent meals instead of large meals.  Wear loose-fitting clothing. Do not wear anything tight around your waist that causes pressure on your stomach.  Raise the head of your bed 6 to 8 inches with wood blocks to help you sleep. Extra pillows will not help.  Only take over-the-counter or prescription medicines for pain, discomfort, or fever as directed by your caregiver.  Do not take aspirin, ibuprofen, or other nonsteroidal anti-inflammatory drugs (NSAIDs). SEEK IMMEDIATE MEDICAL CARE IF:   You have pain in your arms, neck, jaw, teeth, or back.  Your pain increases or changes in intensity or duration.  You develop nausea, vomiting, or sweating (diaphoresis).  You develop shortness of breath, or you faint.  Your vomit is green, yellow, black, or looks like coffee grounds or blood.  Your stool is red, bloody, or black. These symptoms could be signs of other problems, such as heart disease, gastric bleeding, or esophageal bleeding. MAKE SURE YOU:   Understand these instructions.  Will watch your condition.  Will get help right away if you are not doing well or get worse. Document Released: 06/29/2005 Document Revised: 12/12/2011 Document Reviewed: 04/08/2011 Exit[employer] Patient Information 2015 [employer]. This information is not intended to replace advice given to you by your health care provider. Make sure you discuss any questions you have with your health care provider.  

## 2014-12-31 NOTE — Progress Notes (Signed)
Pt is here today c/o chest pain that he has had for 3 weeks. Pt wants that doctor to address his umbilical hernia.

## 2014-12-31 NOTE — Progress Notes (Signed)
   Subjective:    Patient ID: Dalton Arnold, male    DOB: 1966-03-13, 49 y.o.   MRN: [id number]  HPI Comments: He has a abdominal hernia for several years. He reports that he is ready to have surgery for removal due to it beginning to hurt him.   Chest Pain  This is a new problem. The current episode started 1 to 4 weeks ago. The problem occurs every several days. The problem has been unchanged. The pain is present in the epigastric region (mostly at night). The pain is mild. The quality of the pain is described as burning. The pain radiates to the epigastrium. Pertinent negatives include no cough, exertional chest pressure, irregular heartbeat, nausea or syncope. The pain is aggravated by food. He has tried nothing (stopped taking protonix for several weeks) for the symptoms. The treatment provided no relief. Risk factors include male gender.  Pertinent negatives for past medical history include no COPD, no CHF and no diabetes.    Review of Systems  Respiratory: Negative for cough.   Cardiovascular: Positive for chest pain. Negative for syncope.  Gastrointestinal: Negative for nausea.  All other systems reviewed and are negative.      Objective:   Physical Exam  Constitutional: He is oriented to person, place, and time.  Cardiovascular: Normal rate, regular rhythm and normal heart sounds.   Pulmonary/Chest: Effort normal and breath sounds normal.  Abdominal: Soft. Bowel sounds are normal. He exhibits no distension. There is no tenderness.  Neurological: He is alert and oriented to person, place, and time.  Skin: Skin is warm and dry.       Assessment & Plan:  Dalton Arnold was seen today for follow-up.  Diagnoses and all orders for this visit:  Chest pain, unspecified chest pain type Likely due to acid reflux. Normal EKG  Gastroesophageal reflux disease, esophagitis presence not specified Orders: -     pantoprazole (PROTONIX) 40 MG tablet; Take 1 tablet (40 mg total) by mouth  daily. Will start patient back on his protonix and gave examples of medications that will increase his pressure  Umbilical hernia without obstruction and without gangrene Orders: -     Ambulatory referral to General Surgery Patient wants hernia removed before he goes on his month long vacation next month.   May follow up as needed.  Chari Manning, NP 12/31/2014 8:56 PM

## 2014-12-31 NOTE — Progress Notes (Signed)
Patient ID: Dalton Arnold, male   DOB: [birth year], 49 y.o.   MRN: [id number]  CC:  HPI: Dalton Arnold is a 49 y.o. male here today for a follow up visit.  Patient has past medical history of  Patient has No headache, No chest pain, No abdominal pain - No Nausea, No new weakness tingling or numbness, No Cough - SOB.  Allergies  Allergen Reactions  . Acyclovir And Related   . Eggs Or Egg-Derived Products Rash  . Nicoderm [Nicotine] Rash   Past Medical History  Diagnosis Date  . Diarrhea   . Thyroid mass    Current Outpatient Prescriptions on File Prior to Visit  Medication Sig Dispense Refill  . HYDROcodone-acetaminophen (NORCO/VICODIN) 5-325 MG per tablet Take 1-2 tablets by mouth every 6 (six) hours as needed for moderate pain. (Patient not taking: Reported on 12/31/2014) 30 tablet 0  . naproxen sodium (ANAPROX) 220 MG tablet Take 220 mg by mouth 2 (two) times daily with a meal.    . pantoprazole (PROTONIX) 40 MG tablet Take 1 tablet (40 mg total) by mouth 2 (two) times daily. Switch for any other PPI at similar dose and frequency (Patient not taking: Reported on 08/18/2014) 60 tablet 3   No current facility-administered medications on file prior to visit.   Family History  Problem Relation Age of Onset  . Thyroid disease Neg Hx    History   Social History  . Marital Status: Married    Spouse Name: N/A  . Number of Children: N/A  . Years of Education: N/A   Occupational History  . Not on file.   Social History Main Topics  . Smoking status: Current Every Day Smoker -- 1.00 packs/day    Types: Cigarettes  . Smokeless tobacco: Never Used  . Alcohol Use: Yes  . Drug Use: No  . Sexual Activity: Yes   Other Topics Concern  . Not on file   Social History Narrative    Review of Systems: Constitutional: Negative for fever, chills, diaphoresis, activity change, appetite change and fatigue. HENT: Negative for ear pain, nosebleeds, congestion, facial swelling,  rhinorrhea, neck pain, neck stiffness and ear discharge.  Eyes: Negative for pain, discharge, redness, itching and visual disturbance. Respiratory: Negative for cough, choking, chest tightness, shortness of breath, wheezing and stridor.  Cardiovascular: Negative for chest pain, palpitations and leg swelling. Gastrointestinal: Negative for abdominal distention. Genitourinary: Negative for dysuria, urgency, frequency, hematuria, flank pain, decreased urine volume, difficulty urinating and dyspareunia.  Musculoskeletal: Negative for back pain, joint swelling, arthralgias and gait problem. Neurological: Negative for dizziness, tremors, seizures, syncope, facial asymmetry, speech difficulty, weakness, light-headedness, numbness and headaches.  Hematological: Negative for adenopathy. Does not bruise/bleed easily. Psychiatric/Behavioral: Negative for hallucinations, behavioral problems, confusion, dysphoric mood, decreased concentration and agitation.    Objective:   Filed Vitals:   12/31/14 1208  BP: 132/82  Pulse: 72  Temp: 98.8 F (37.1 C)  Resp: 16    Physical Exam: Constitutional: Patient appears well-developed and well-nourished. No distress. HENT: Normocephalic, atraumatic, External right and left ear normal. Oropharynx is clear and moist.  Eyes: Conjunctivae and EOM are normal. PERRLA, no scleral icterus. Neck: Normal ROM. Neck supple. No JVD. No tracheal deviation. No thyromegaly. CVS: RRR, S1/S2 +, no murmurs, no gallops, no carotid bruit.  Pulmonary: Effort and breath sounds normal, no stridor, rhonchi, wheezes, rales.  Abdominal: Soft. BS +,  no distension, tenderness, rebound or guarding.  Musculoskeletal: Normal range of motion. No edema and no tenderness.  Lymphadenopathy: No lymphadenopathy noted, cervical, inguinal or axillary Neuro: Alert. Normal reflexes, muscle tone coordination. No cranial nerve deficit. Skin: Skin is warm and dry. No rash noted. Not diaphoretic. No  erythema. No pallor. Psychiatric: Normal mood and affect. Behavior, judgment, thought content normal.  Lab Results  Component Value Date   WBC 10.9* 08/14/2014   HGB 14.8 08/14/2014   HCT 43.5 08/14/2014   MCV 78.5 08/14/2014   PLT 365 08/14/2014   Lab Results  Component Value Date   CREATININE 0.71 08/14/2014   BUN 11 08/14/2014   NA 138 08/14/2014   K 4.1 08/14/2014   CL 101 08/14/2014   CO2 23 08/14/2014    No results found for: HGBA1C Lipid Panel  No results found for: CHOL, TRIG, HDL, CHOLHDL, VLDL, LDLCALC     Assessment and plan:   There are no diagnoses linked to this encounter.       Chari Manning, NP-C [facility] [phone] 12/31/2014, 12:41 PM

## 2015-07-07 ENCOUNTER — Other Ambulatory Visit: Payer: Self-pay | Admitting: Otolaryngology

## 2015-07-07 DIAGNOSIS — C73 Malignant neoplasm of thyroid gland: Secondary | ICD-10-CM

## 2015-08-04 ENCOUNTER — Ambulatory Visit: Payer: Self-pay | Admitting: Surgery

## 2015-09-04 ENCOUNTER — Ambulatory Visit
Admission: RE | Admit: 2015-09-04 | Discharge: 2015-09-04 | Disposition: A | Discharge status: Home / Self Care | Payer: BLUE CROSS/BLUE SHIELD | Source: Ambulatory Visit | Attending: Surgery | Admitting: Surgery

## 2015-09-04 ENCOUNTER — Other Ambulatory Visit: Payer: Self-pay | Admitting: Surgery

## 2015-09-04 DIAGNOSIS — Z01811 Encounter for preprocedural respiratory examination: Secondary | ICD-10-CM

## 2015-09-04 DIAGNOSIS — K429 Umbilical hernia without obstruction or gangrene: Secondary | ICD-10-CM

## 2015-09-23 ENCOUNTER — Emergency Department (HOSPITAL_COMMUNITY)
Admission: EM | Admit: 2015-09-23 | Discharge: 2015-09-23 | Disposition: A | Discharge status: Home / Self Care | Payer: BLUE CROSS/BLUE SHIELD | Source: Home / Self Care | Attending: Family Medicine | Admitting: Family Medicine

## 2015-09-23 ENCOUNTER — Encounter (HOSPITAL_COMMUNITY): Payer: Self-pay | Admitting: Emergency Medicine

## 2015-09-23 DIAGNOSIS — L509 Urticaria, unspecified: Secondary | ICD-10-CM

## 2015-09-23 MED ORDER — TRIAMCINOLONE ACETONIDE 40 MG/ML IJ SUSP
INTRAMUSCULAR | Status: AC
Start: 2015-09-23 — End: 2015-09-23
  Filled 2015-09-23: qty 1

## 2015-09-23 MED ORDER — TRIAMCINOLONE ACETONIDE 40 MG/ML IJ SUSP
40.0000 mg | Freq: Once | INTRAMUSCULAR | Status: AC
  Administered 2015-09-23: 40 mg via INTRAMUSCULAR

## 2015-09-23 MED ORDER — PREDNISONE 20 MG PO TABS: ORAL_TABLET | ORAL | Status: DC

## 2015-09-23 NOTE — ED Notes (Signed)
The patient presented to the [facility] with a complaint of a rash that has been there for about 1 week (after a hernia repair procedure). The patient stated that he went back to the surgeon and he stated that rash was not from the surgery but prescribed him 10 mg Prednisone which has not helped.

## 2015-09-23 NOTE — Discharge Instructions (Signed)
Hives Take the Prednisone as directed, take with food Take Zyrtec 10 mg daily or Claritin 10 mg daily for itching and rash. Hives are itchy, red, swollen areas of the skin. They can vary in size and location on your body. Hives can come and go for hours or several days (acute hives) or for several weeks (chronic hives). Hives do not spread from person to person (noncontagious). They may get worse with scratching, exercise, and emotional stress. CAUSES   Allergic reaction to food, additives, or drugs.  Infections, including the common cold.  Illness, such as vasculitis, lupus, or thyroid disease.  Exposure to sunlight, heat, or cold.  Exercise.  Stress.  Contact with chemicals. SYMPTOMS   Red or white swollen patches on the skin. The patches may change size, shape, and location quickly and repeatedly.  Itching.  Swelling of the hands, feet, and face. This may occur if hives develop deeper in the skin. DIAGNOSIS  Your caregiver can usually tell what is wrong by performing a physical exam. Skin or blood tests may also be done to determine the cause of your hives. In some cases, the cause cannot be determined. TREATMENT  Mild cases usually get better with medicines such as antihistamines. Severe cases may require an emergency epinephrine injection. If the cause of your hives is known, treatment includes avoiding that trigger.  HOME CARE INSTRUCTIONS   Avoid causes that trigger your hives.  Take antihistamines as directed by your caregiver to reduce the severity of your hives. Non-sedating or low-sedating antihistamines are usually recommended. Do not drive while taking an antihistamine.  Take any other medicines prescribed for itching as directed by your caregiver.  Wear loose-fitting clothing.  Keep all follow-up appointments as directed by your caregiver. SEEK MEDICAL CARE IF:   You have persistent or severe itching that is not relieved with medicine.  You have painful or  swollen joints. SEEK IMMEDIATE MEDICAL CARE IF:   You have a fever.  Your tongue or lips are swollen.  You have trouble breathing or swallowing.  You feel tightness in the throat or chest.  You have abdominal pain. These problems may be the first sign of a life-threatening allergic reaction. Call your local emergency services (911 in [place] MAKE SURE YOU:   Understand these instructions.  Will watch your condition.  Will get help right away if you are not doing well or get worse.   This information is not intended to replace advice given to you by your health care provider. Make sure you discuss any questions you have with your health care provider.   Document Released: 09/19/2005 Document Revised: 09/24/2013 Document Reviewed: 12/13/2011 Elsevier Interactive Patient Education [employer].

## 2015-09-23 NOTE — ED Provider Notes (Signed)
CSN: [id number]     Arrival date & time 09/23/15  1635 History   First MD Initiated Contact with Patient 09/23/15 1753     Chief Complaint  Patient presents with  . Rash   (Consider location/radiation/quality/duration/timing/severity/associated sxs/prior Treatment) HPI Comments: 49 year old male is complaining of an itchy rash for approximately one week. He states it occurred shortly after he had umbilical hernia surgery. He went back to the surgeon who told him that it was not due to the surgery and he was placed on 10 mg of prednisone to take daily. The medication at this dose has not helped any. He has typical urticarial rash that occurs in various places, disappears and then reoccurs another areas. He currently has urticarial rash to the upper extremities, right lower back and the thighs. He denies having trouble breathing, cough, shortness of breath, peripheral swelling or intraoral swelling.  Patient is a 49 y.o. male presenting with rash.  Rash Associated symptoms: no fatigue, no fever, no shortness of breath, no sore throat and not wheezing     Past Medical History  Diagnosis Date  . Diarrhea   . Thyroid mass    Past Surgical History  Procedure Laterality Date  . Nasal fracture surgery    . Esophagogastroduodenoscopy N/A 06/27/2014    Procedure: ESOPHAGOGASTRODUODENOSCOPY (EGD);  Surgeon: Beryle Beams, MD;  Location: [facility];  Service: Endoscopy;  Laterality: N/A;  . Hernia repair      x2  . Thyroidectomy Left 08/19/2014    Procedure: LEFT THYROID LOBECTOMY;  Surgeon: Rozetta Nunnery, MD;  Location: [facility];  Service: ENT;  Laterality: Left;   Family History  Problem Relation Age of Onset  . Thyroid disease Neg Hx    Social History  Substance Use Topics  . Smoking status: Current Every Day Smoker -- 1.00 packs/day    Types: Cigarettes  . Smokeless tobacco: Never Used  . Alcohol Use: Yes    Review of Systems  Constitutional: Negative for fever, activity  change and fatigue.  HENT: Negative for congestion, ear pain, nosebleeds, postnasal drip, rhinorrhea, sore throat and trouble swallowing.   Eyes: Negative.   Respiratory: Negative for cough, shortness of breath and wheezing.   Cardiovascular: Negative.   Skin: Positive for rash.    Allergies  Acyclovir and related; Eggs or egg-derived products; and Nicoderm  Home Medications   Prior to Admission medications   Medication Sig Start Date End Date Taking? Authorizing Provider  aspirin 325 MG tablet Take 325 mg by mouth daily.    Historical Provider, MD  naproxen sodium (ANAPROX) 220 MG tablet Take 220 mg by mouth 2 (two) times daily with a meal.    Historical Provider, MD  pantoprazole (PROTONIX) 40 MG tablet Take 1 tablet (40 mg total) by mouth daily. 12/31/14   Lance Bosch, NP  predniSONE (DELTASONE) 20 MG tablet 3 Tabs PO Days 1-3, then 2 tabs PO Days 4-6, then 1 tab PO Day 7-9, then Half Tab PO Day 10-12. Take with food. 09/23/15   Janne Napoleon, NP   Meds Ordered and Administered this Visit   Medications  triamcinolone acetonide (KENALOG-40) injection 40 mg (not administered)    BP 152/98 mmHg  Pulse 88  Temp(Src) 98.1 F (36.7 C) (Oral)  Resp 20  SpO2 96% No data found.   Physical Exam  Constitutional: He is oriented to person, place, and time. He appears well-developed and well-nourished. No distress.  HENT:  Mouth/Throat: Oropharynx is clear and moist. No  oropharyngeal exudate.  No intraoral swelling, erythema or lesions.  Eyes: EOM are normal.  Neck: Normal range of motion. Neck supple.  Cardiovascular: Normal rate, regular rhythm, normal heart sounds and intact distal pulses.   Pulmonary/Chest: Effort normal and breath sounds normal. No respiratory distress. He has no wheezes. He has no rales.  Musculoskeletal: He exhibits no edema.  Lymphadenopathy:    He has no cervical adenopathy.  Neurological: He is alert and oriented to person, place, and time. He exhibits  normal muscle tone.  Skin: Skin is warm and dry. Rash noted.  Urticarial like rash to the extremities, anterior trunk, chest and abdomen and lesser to the lower extremities. These are red raised lesions of various morphologies. No evidence of vermin.   Psychiatric: He has a normal mood and affect.  Nursing note and vitals reviewed.   ED Course  Procedures (including critical care time)  Labs Review Labs Reviewed - No data to display  Imaging Review No results found.   Visual Acuity Review  Right Eye Distance:   Left Eye Distance:   Bilateral Distance:    Right Eye Near:   Left Eye Near:    Bilateral Near:         MDM   1. Urticaria of unknown origin    Take the Prednisone as directed, take with food Take Zyrtec 10 mg daily or Claritin 10 mg daily for itching and rash. Urticarial instructions with red flags/warnings discussed and written- go to the ED    Janne Napoleon, NP 09/23/15 1905

## 2015-11-14 IMAGING — CT CT ANGIO CHEST
2 of 4 series · 17 of 31 positions shown · IV contrast (OMNIPAQUE 300)
Comparison: Prior radiograph from 06/24/2014

CLINICAL DATA: Chest pain, abdominal pain, nausea

EXAM:
CT CHEST, ABDOMEN AND PELVIS WITHOUT CONTRAST
TECHNIQUE: Multidetector CT imaging of the chest, abdomen and pelvis was
performed following the standard protocol without IV contrast.

[Series 10: thins for pacs · axial · 0.79mm/px · z∈[+1338,+1548]mm · 14 of 245 slices shown]
[im 18/245  lung]
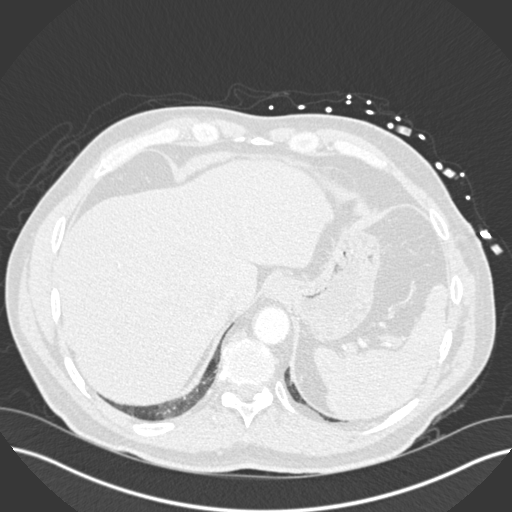
[im 35/245  mediastinal]
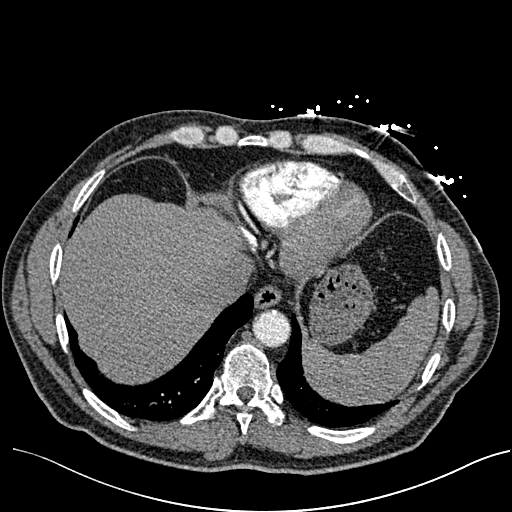
[im 53/245  lung]
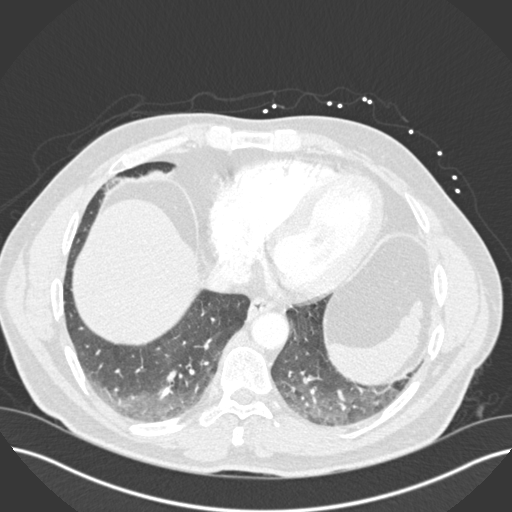
[im 70/245  mediastinal]
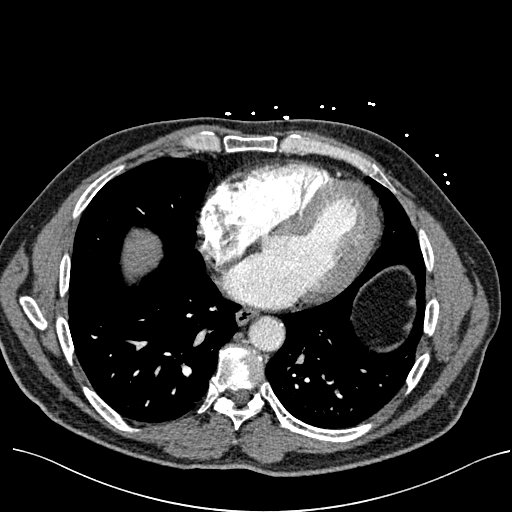
[im 88/245  lung]
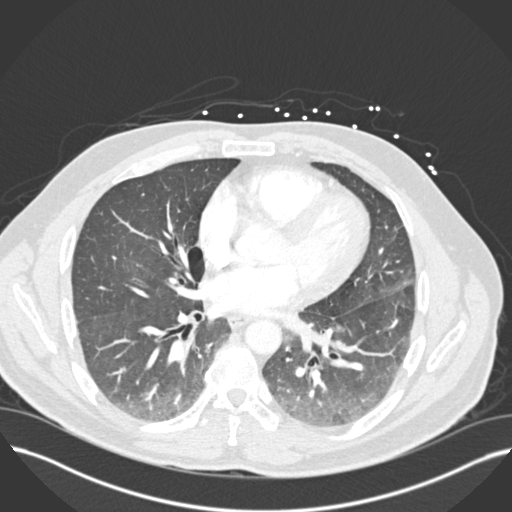
[im 105/245  mediastinal]
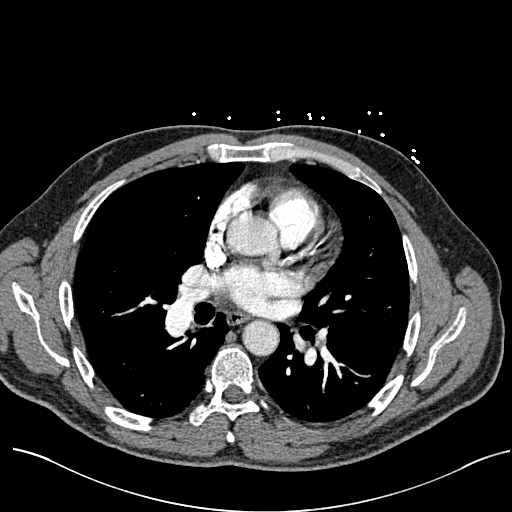
[im 122/245  lung]
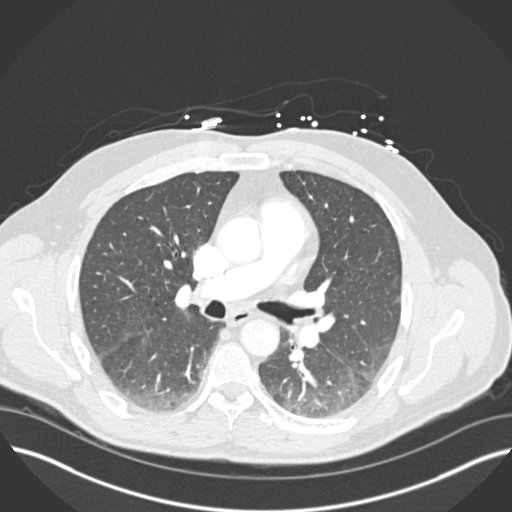
[im 123/245  mediastinal]
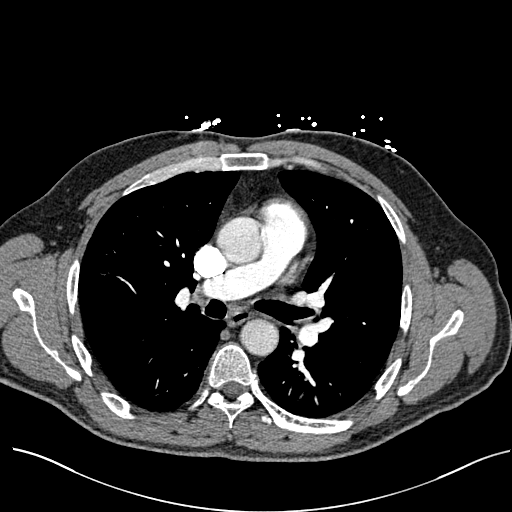
[im 140/245  lung]
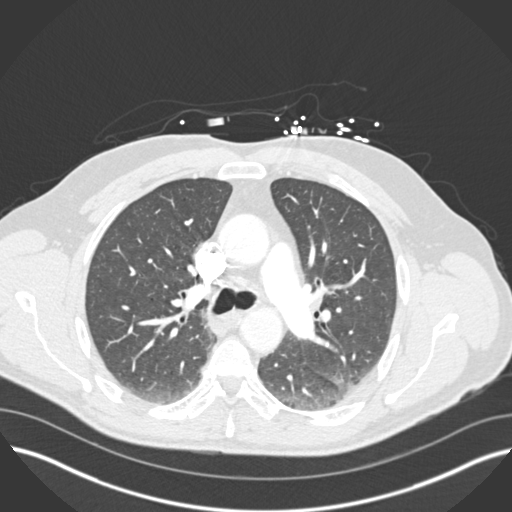
[im 157/245  mediastinal]
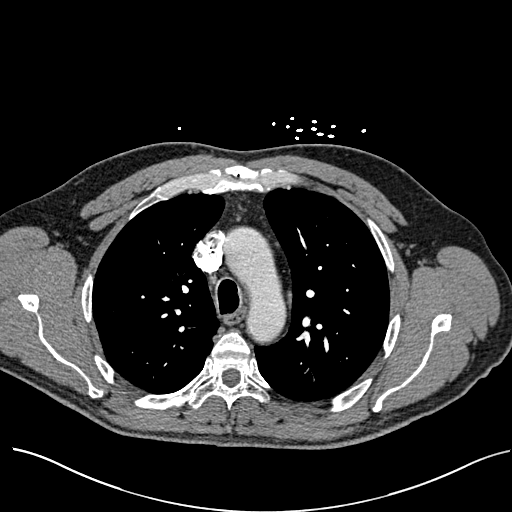
[im 175/245  lung]
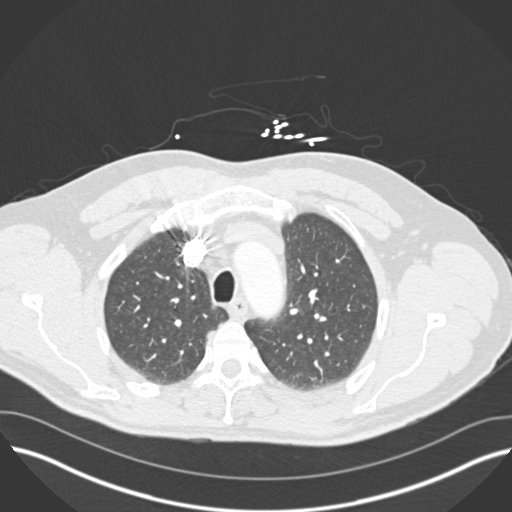
[im 192/245  mediastinal]
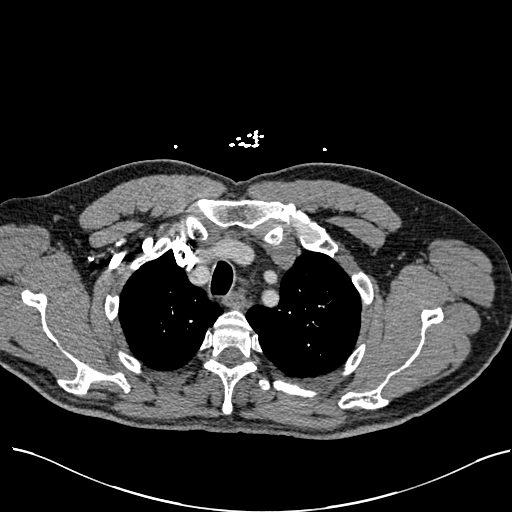
[im 210/245  lung]
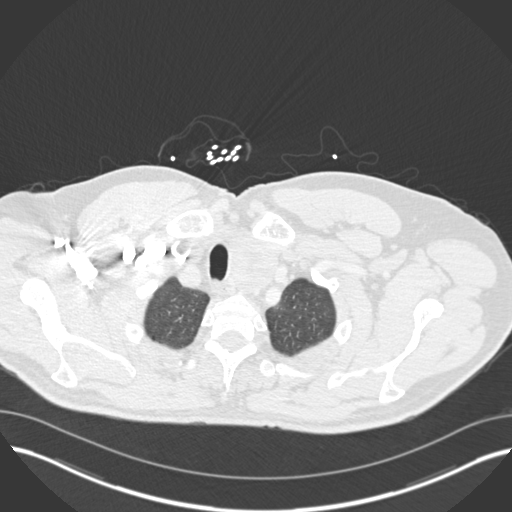
[im 227/245  mediastinal]
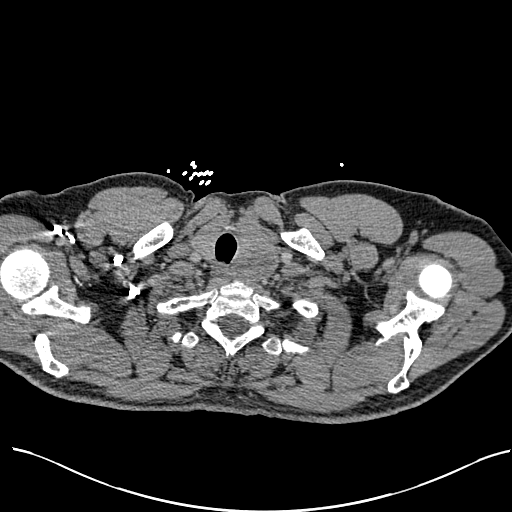

[Series 12: abd/pel with · axial · 0.82mm/px · z∈[+1016,+1216]mm · 3 of 101 slices shown]
[im 21/101  lung]
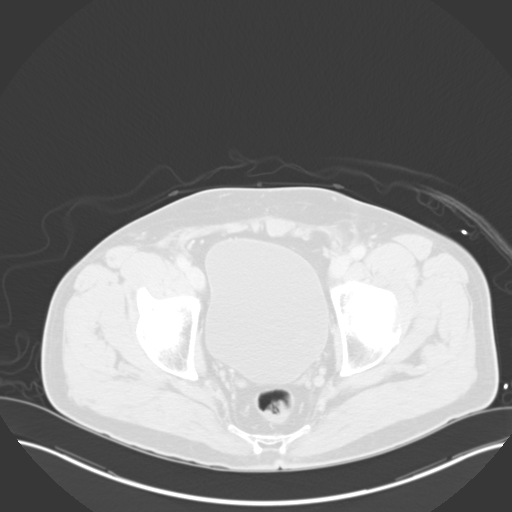
[im 41/101  lung]
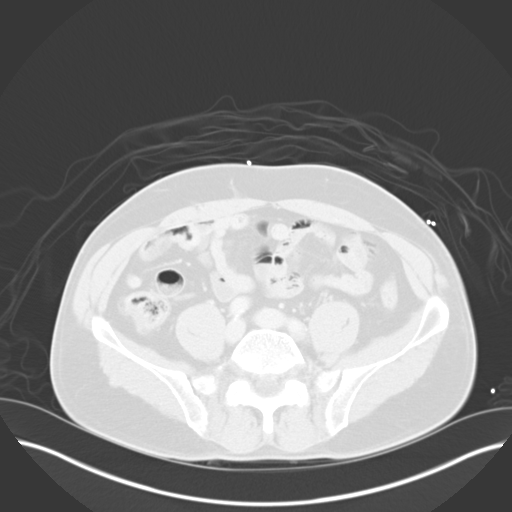
[im 61/101  lung]
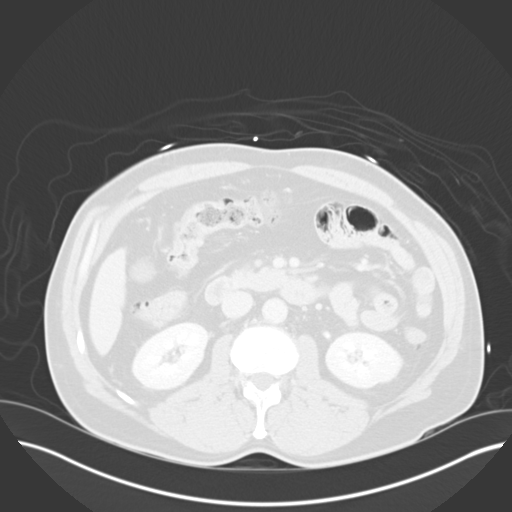

[17 of 31 positions shown; findings below may reference images not displayed]

FINDINGS: CT CHEST FINDINGS

Large heterogeneous predominantly hypodense nodule measuring
approximately 3.9 x 3.6 cm seen within the left thyroid lobe. The
tracheal air column is bowed to the right at this level but remains
widely patent.

No pathologically enlarged mediastinal, hilar, or axillary lymph
nodes are identified.

Intrathoracic aorta is of normal caliber and appearance. Great
vessels within normal limits. Minimal atherosclerotic plaque present
at the origin of the left common carotid artery.

Heart size is normal. No pericardial effusion. Diffuse 3 vessel
coronary artery calcifications present.

Pulmonary arterial tree is well opacified. No filling defect to
suggest acute pulmonary embolism identified. Re-formatted imaging
confirms these findings.

Subsegmental atelectasis seen dependently within the visualized lung
bases. No focal infiltrates identified. No pulmonary edema or
pleural effusion. No worrisome pulmonary nodule or mass. Mild upper
lobe predominant centrilobular emphysema present.

No acute osseus abnormality. No worrisome lytic or blastic osseous
lesions.

CT ABDOMEN AND PELVIS FINDINGS

The liver demonstrates a normal contrast enhanced appearance.
Gallbladder within normal limits. The spleen and adrenal glands are
unremarkable. The pancreas is within normal limits. Delete that

Scattered cyst noted within the bilateral kidneys. No
nephrolithiasis, hydronephrosis, or focal enhancing renal mass.

Stomach within normal limits.

There are circumferential mucosal enhancement with wall thickening
and inflammatory stranding about the second portion of the duodenum,
suggesting acute to edematous. These inflammatory changes closely
approximates the pancreatic head, although the pancreas itself is
favored to be within normal limits. No evidence of bowel
obstruction. Appendix is normal. No other acute inflammatory changes
seen about the bowels.

Fact containing paraumbilical hernia noted.

Bladder and prostate within normal limits.

No free air or fluid. No pathologically enlarged intra-abdominal
pelvic lymph nodes. Moderate aorto bi-iliac atherosclerotic
calcifications present without aneurysm.

No acute osseous abnormality. No worrisome lytic or blastic osseous
lesion.
IMPRESSION: CT CHEST IMPRESSION:

1. No CT evidence of acute pulmonary embolism or other acute
cardiopulmonary abnormality.
2. Mild upper lobe predominant centrilobular emphysema.
3. 3.9 x 3.6 cm left thyroid nodule. Further evaluation with
dedicated thyroid ultrasound recommended. This could be performed on
a nonemergent basis.

CT ABDOMEN AND PELVIS IMPRESSION:

1. Circumferential wall thickening with mucosal enhancement about
the second portion of the duodenum, suggesting acute duodenitis.
Inflammatory stranding within the adjacent peripancreatic fat is
favored to be reactive in nature from the acute process in the
duodenum, although possible acute pancreatitis not entirely
excluded. Correlation with serum lipase recommended.
2. No other acute intra-abdominal or pelvic process.

## 2016-04-11 ENCOUNTER — Ambulatory Visit: Payer: BLUE CROSS/BLUE SHIELD | Attending: Internal Medicine | Admitting: Internal Medicine

## 2016-04-11 VITALS — BP 177/93 | HR 70 | Temp 98.8°F | Resp 16 | Ht 68.0 in | Wt 210.2 lb

## 2016-04-11 DIAGNOSIS — F329 Major depressive disorder, single episode, unspecified: Secondary | ICD-10-CM | POA: Insufficient documentation

## 2016-04-11 DIAGNOSIS — F17200 Nicotine dependence, unspecified, uncomplicated: Secondary | ICD-10-CM

## 2016-04-11 DIAGNOSIS — Z13220 Encounter for screening for lipoid disorders: Secondary | ICD-10-CM

## 2016-04-11 DIAGNOSIS — I10 Essential (primary) hypertension: Secondary | ICD-10-CM | POA: Diagnosis not present

## 2016-04-11 DIAGNOSIS — Z1211 Encounter for screening for malignant neoplasm of colon: Secondary | ICD-10-CM

## 2016-04-11 DIAGNOSIS — E119 Type 2 diabetes mellitus without complications: Secondary | ICD-10-CM | POA: Insufficient documentation

## 2016-04-11 DIAGNOSIS — Z720 Tobacco use: Secondary | ICD-10-CM

## 2016-04-11 DIAGNOSIS — Z131 Encounter for screening for diabetes mellitus: Secondary | ICD-10-CM

## 2016-04-11 DIAGNOSIS — Z125 Encounter for screening for malignant neoplasm of prostate: Secondary | ICD-10-CM

## 2016-04-11 DIAGNOSIS — D34 Benign neoplasm of thyroid gland: Secondary | ICD-10-CM

## 2016-04-11 DIAGNOSIS — Z7982 Long term (current) use of aspirin: Secondary | ICD-10-CM | POA: Diagnosis not present

## 2016-04-11 DIAGNOSIS — F17210 Nicotine dependence, cigarettes, uncomplicated: Secondary | ICD-10-CM | POA: Insufficient documentation

## 2016-04-11 DIAGNOSIS — F32A Depression, unspecified: Secondary | ICD-10-CM

## 2016-04-11 LAB — TSH: TSH: 0.93 m[IU]/L (ref 0.40–4.50)

## 2016-04-11 LAB — CBC WITH DIFFERENTIAL/PLATELET
BASOS PCT: 1 %
Basophils Absolute: 93 cells/uL (ref 0–200)
EOS PCT: 3 %
Eosinophils Absolute: 279 cells/uL (ref 15–500)
HEMATOCRIT: 46.1 % (ref 38.5–50.0)
HEMOGLOBIN: 15.7 g/dL (ref 13.2–17.1)
LYMPHS ABS: 2697 {cells}/uL (ref 850–3900)
Lymphocytes Relative: 29 %
MCH: 26.2 pg — ABNORMAL LOW (ref 27.0–33.0)
MCHC: 34.1 g/dL (ref 32.0–36.0)
MCV: 77 fL — ABNORMAL LOW (ref 80.0–100.0)
MONO ABS: 558 {cells}/uL (ref 200–950)
MPV: 10.4 fL (ref 7.5–12.5)
Monocytes Relative: 6 %
Neutro Abs: 5673 cells/uL (ref 1500–7800)
Neutrophils Relative %: 61 %
Platelets: 319 10*3/uL (ref 140–400)
RBC: 5.99 MIL/uL — AB (ref 4.20–5.80)
RDW: 16.1 % — AB (ref 11.0–15.0)
WBC: 9.3 10*3/uL (ref 3.8–10.8)

## 2016-04-11 LAB — LIPID PANEL
CHOL/HDL RATIO: 6.2 ratio — AB (ref <=5.0)
CHOLESTEROL: 242 mg/dL — AB (ref 125–200)
HDL: 39 mg/dL — AB (ref >=40)
LDL Cholesterol: 173 mg/dL — ABNORMAL HIGH (ref <130)
Triglycerides: 149 mg/dL (ref <150)
VLDL: 30 mg/dL (ref <30)

## 2016-04-11 LAB — BASIC METABOLIC PANEL WITH GFR
BUN: 14 mg/dL (ref 7–25)
CHLORIDE: 104 mmol/L (ref 98–110)
CO2: 21 mmol/L (ref 20–31)
Calcium: 8.9 mg/dL (ref 8.6–10.3)
Creat: 0.84 mg/dL (ref 0.70–1.33)
Glucose, Bld: 105 mg/dL — ABNORMAL HIGH (ref 65–99)
POTASSIUM: 4.4 mmol/L (ref 3.5–5.3)
SODIUM: 136 mmol/L (ref 135–146)

## 2016-04-11 LAB — T4, FREE: FREE T4: 1.3 ng/dL (ref 0.8–1.8)

## 2016-04-11 MED ORDER — LISINOPRIL-HYDROCHLOROTHIAZIDE 20-25 MG PO TABS
1.0000 | ORAL_TABLET | Freq: Every day | ORAL | Status: DC
Start: 2016-04-11 — End: 2016-06-20

## 2016-04-11 MED ORDER — BUPROPION HCL ER (SR) 150 MG PO TB12: 150.0000 mg | ORAL_TABLET | Freq: Two times a day (BID) | ORAL | Status: DC

## 2016-04-11 MED FILL — BUPROPION SR 150 MG TABLET: 150 | 30 days supply | Qty: 60 | Fill #0

## 2016-04-11 MED FILL — LISINOPRIL-HCTZ 20-25 MG TA: 20-25 | 30 days supply | Qty: 30 | Fill #0

## 2016-04-11 NOTE — Progress Notes (Signed)
Dalton Arnold, is a 50 y.o. male  [id number]  [id number]  DOB - 06/08/1966  CC:  Chief Complaint  Patient presents with  . Annual Exam       HPI: Dalton Arnold is a 50 y.o. male here today to establish medical care, last seen in clinic 3/16, lost to f/u due to insurance issues.  Pt doing well, wants to have check up. 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  ENT recd surg on right, pt hesitant about it, so Ct neck was ordered 11/15, but never done. Pt has not f/u w/ endo or ENT since.  About 2 months ago, c/o of transient bleeding from right ear, resolved, and continues to use qtips to "clean ears".  No weight gain/loss, c/o of "bubbles in his anus", no pruritis, hematochezia/melena. Never had colonoscopy.  +1ppd tob, tried marijuana at one time and did not care for it. + drinks occasional beer.  +c/o of mild depression, smokes more when "depressed", but denies si/hi/avh. Has a short temper.  Patient has No headache, No chest pain, No abdominal pain - No Nausea, No new weakness tingling or numbness, No Cough - SOB.    Review of Systems: Per HPI, o/w all systems reviewed and Negative.   Allergies  Allergen Reactions  . Acyclovir And Related   . Eggs Or Egg-Derived Products Rash  . Nicoderm [Nicotine] Rash   Past Medical History  Diagnosis Date  . Diarrhea   . Thyroid mass    Current Outpatient Prescriptions on File Prior to Visit  Medication Sig Dispense Refill  . aspirin 325 MG tablet Take 325 mg by mouth daily.    . naproxen sodium (ANAPROX) 220 MG tablet Take 220 mg by mouth 2 (two) times daily with a meal. Reported on 04/11/2016     No current facility-administered medications on file prior to visit.   Family History  Problem Relation Age of Onset  . Thyroid disease Neg Hx    Social History   Social History  . Marital Status: Single    Spouse Name: N/A    . Number of Children: N/A  . Years of Education: N/A   Occupational History  . Not on file.   Social History Main Topics  . Smoking status: Current Every Day Smoker -- 1.00 packs/day    Types: Cigarettes  . Smokeless tobacco: Never Used  . Alcohol Use: Yes  . Drug Use: No  . Sexual Activity: Yes   Other Topics Concern  . Not on file   Social History Narrative    Objective:   Filed Vitals:   04/11/16 0937  BP: 177/93  Pulse: 70  Temp: 98.8 F (37.1 C)  Resp: 16    Filed Weights   04/11/16 0937  Weight: 210 lb 3.2 oz (95.346 kg)    BP Readings from Last 3 Encounters:  04/11/16 177/93  09/23/15 152/98  12/31/14 132/82    Physical Exam: Constitutional: Patient appears well-developed and well-nourished. No distress. AAOx3, pleasant. HENT: Normocephalic, atraumatic, External right and left ear normal. Oropharynx is clear and moist. , bilat TMs clear w/ good light reflex. Eyes: Conjunctivae and EOM are normal. PERRL, no scleral icterus. Neck: Normal ROM. Neck supple. No JVD. No tracheal deviation. No thyromegaly. CVS: RRR, S1/S2 +, no murmurs, no gallops, no carotid bruit.  Pulmonary: Effort and breath sounds normal, no stridor, rhonchi, wheezes, rales.  Abdominal: Soft. BS +,  no distension, tenderness, rebound or guarding.  Musculoskeletal: Normal range of motion. No edema and no tenderness.  LE: bilat/ no c/c/e, pulses 2+ bilateral. Neuro: Alert. muscle tone coordination wnl. No cranial nerve deficit grossly. Skin: Skin is warm and dry. No rash noted. Not diaphoretic. No erythema. No pallor. Psychiatric: Normal mood and affect. Behavior, judgment, thought content normal.  Lab Results  Component Value Date   WBC 10.9* 08/14/2014   HGB 14.8 08/14/2014   HCT 43.5 08/14/2014   MCV 78.5 08/14/2014   PLT 365 08/14/2014   Lab Results  Component Value Date   CREATININE 0.71 08/14/2014   BUN 11 08/14/2014   NA 138 08/14/2014   K 4.1 08/14/2014   CL 101  08/14/2014   CO2 23 08/14/2014    No results found for: HGBA1C Lipid Panel  No results found for: CHOL, TRIG, HDL, CHOLHDL, VLDL, LDLCALC     Depression screen [facility] 2/9 04/11/2016 07/10/2014  Decreased Interest 0 -  Down, Depressed, Hopeless 2 (No Data)  PHQ - 2 Score 2 -  Altered sleeping 0 -  Tired, decreased energy 0 -  Change in appetite 0 -  Feeling bad or failure about yourself  1 -  Trouble concentrating 1 -  Moving slowly or fidgety/restless 0 -  Suicidal thoughts 0 -  PHQ-9 Score 4 -  Difficult doing work/chores Not difficult at all -    Assessment and plan:   1. HTN (hypertension), benign -uncontrolled, low salt/DASH diet discussed - prinzide 20/25 qday started. - BASIC METABOLIC PANEL WITH GFR - CBC with Differential   2. Thyroid adenoma - sp left  thyroid lobectomy 08/19/14 w/ Dr Gwenlyn Saran, ENT - pt wants to hold off on surgery on right if able - Ambulatory referral to Endocrinology, saw Dr Renato Shin, endo, for further eval /other treatment options. - TSH - T4, Free  3. Colon cancer screening - Ambulatory referral to Gastroenterology - colonoscopy  4. Screening cholesterol level - Lipid Panel - fasting  5. Diabetes mellitus screening - POCT glycosylated hemoglobin (Hb A1C)  6. Smoker, 1ppd - tob cessation discussed, wants to try to stop - trial Zyban/welbutrin 150mg  po bid -tips tob cessation discussed.  7. Depression, mild Smokes when depressed, no si/hi/avh - trial welbutrin 150bid, for dual effect  8. Prostate cancer screening - chk psa, d/w pt risk/benefits of screening, explained sen/specificity in layman terms for understanding.    Return in about 2 months (around 06/12/2016) for htn.  The patient was given clear instructions to go to ER or return to medical center if symptoms don't improve, worsen or new problems develop. The patient verbalized understanding. The patient was told to call to get lab results if they haven't heard  anything in the next week.    This note has been created with Surveyor, quantity. Any transcriptional errors are unintentional.   Maren Reamer, MD, [place], [place]   04/11/2016, 10:07 AM

## 2016-04-11 NOTE — Patient Instructions (Signed)
DASH Eating Plan DASH stands for "Dietary Approaches to Stop Hypertension." The DASH eating plan is a healthy eating plan that has been shown to reduce high blood pressure (hypertension). Additional health benefits may include reducing the risk of type 2 diabetes mellitus, heart disease, and stroke. The DASH eating plan may also help with weight loss. WHAT DO I NEED TO KNOW ABOUT THE DASH EATING PLAN? For the DASH eating plan, you will follow these general guidelines:  Choose foods with a percent daily value for sodium of less than 5% (as listed on the food label).  Use salt-free seasonings or herbs instead of table salt or sea salt.  Check with your health care provider or pharmacist before using salt substitutes.  Eat lower-sodium products, often labeled as "lower sodium" or "no salt added."  Eat fresh foods.  Eat more vegetables, fruits, and low-fat dairy products.  Choose whole grains. Look for the word "whole" as the first word in the ingredient list.  Choose fish and skinless chicken or turkey more often than red meat. Limit fish, poultry, and meat to 6 oz (170 g) each day.  Limit sweets, desserts, sugars, and sugary drinks.  Choose heart-healthy fats.  Limit cheese to 1 oz (28 g) per day.  Eat more home-cooked food and less restaurant, buffet, and fast food.  Limit fried foods.  Cook foods using methods other than frying.  Limit canned vegetables. If you do use them, rinse them well to decrease the sodium.  When eating at a restaurant, ask that your food be prepared with less salt, or no salt if possible. WHAT FOODS CAN I EAT? Seek help from a dietitian for individual calorie needs. Grains Whole grain or whole wheat bread. Brown rice. Whole grain or whole wheat pasta. Quinoa, bulgur, and whole grain cereals. Low-sodium cereals. Corn or whole wheat flour tortillas. Whole grain cornbread. Whole grain crackers. Low-sodium crackers. Vegetables Fresh or frozen vegetables  (raw, steamed, roasted, or grilled). Low-sodium or reduced-sodium tomato and vegetable juices. Low-sodium or reduced-sodium tomato sauce and paste. Low-sodium or reduced-sodium canned vegetables.  Fruits All fresh, canned (in natural juice), or frozen fruits. Meat and Other Protein Products Ground beef (85% or leaner), grass-fed beef, or beef trimmed of fat. Skinless chicken or turkey. Ground chicken or turkey. Pork trimmed of fat. All fish and seafood. Eggs. Dried beans, peas, or lentils. Unsalted nuts and seeds. Unsalted canned beans. Dairy Low-fat dairy products, such as skim or 1% milk, 2% or reduced-fat cheeses, low-fat ricotta or cottage cheese, or plain low-fat yogurt. Low-sodium or reduced-sodium cheeses. Fats and Oils Tub margarines without trans fats. Light or reduced-fat mayonnaise and salad dressings (reduced sodium). Avocado. Safflower, olive, or canola oils. Natural peanut or almond butter. Other Unsalted popcorn and pretzels. The items listed above may not be a complete list of recommended foods or beverages. Contact your dietitian for more options. WHAT FOODS ARE NOT RECOMMENDED? Grains White bread. White pasta. White rice. Refined cornbread. Bagels and croissants. Crackers that contain trans fat. Vegetables Creamed or fried vegetables. Vegetables in a cheese sauce. Regular canned vegetables. Regular canned tomato sauce and paste. Regular tomato and vegetable juices. Fruits Dried fruits. Canned fruit in light or heavy syrup. Fruit juice. Meat and Other Protein Products Fatty cuts of meat. Ribs, chicken wings, bacon, sausage, bologna, salami, chitterlings, fatback, hot dogs, bratwurst, and packaged luncheon meats. Salted nuts and seeds. Canned beans with salt. Dairy Whole or 2% milk, cream, half-and-half, and cream cheese. Whole-fat or sweetened yogurt. Full-fat   cheeses or blue cheese. Nondairy creamers and whipped toppings. Processed cheese, cheese spreads, or cheese  curds. Condiments Onion and garlic salt, seasoned salt, table salt, and sea salt. Canned and packaged gravies. Worcestershire sauce. Tartar sauce. Barbecue sauce. Teriyaki sauce. Soy sauce, including reduced sodium. Steak sauce. Fish sauce. Oyster sauce. Cocktail sauce. Horseradish. Ketchup and mustard. Meat flavorings and tenderizers. Bouillon cubes. Hot sauce. Tabasco sauce. Marinades. Taco seasonings. Relishes. Fats and Oils Butter, stick margarine, lard, shortening, ghee, and bacon fat. Coconut, palm kernel, or palm oils. Regular salad dressings. Other Pickles and olives. Salted popcorn and pretzels. The items listed above may not be a complete list of foods and beverages to avoid. Contact your dietitian for more information. WHERE CAN I FIND MORE INFORMATION? National Heart, Lung, and Blood Institute: travelstabloid.com   This information is not intended to replace advice given to you by your health care provider. Make sure you discuss any questions you have with your health care provider.   Document Released: 09/08/2011 Document Revised: 10/10/2014 Document Reviewed: 07/24/2013 Elsevier Interactive Patient Education 2016 Elsevier Inc.  - Low-Sodium Eating Plan Sodium raises blood pressure and causes water to be held in the body. Getting less sodium from food will help lower your blood pressure, reduce any swelling, and protect your heart, liver, and kidneys. We get sodium by adding salt (sodium chloride) to food. Most of our sodium comes from canned, boxed, and frozen foods. Restaurant foods, fast foods, and pizza are also very high in sodium. Even if you take medicine to lower your blood pressure or to reduce fluid in your body, getting less sodium from your food is important. WHAT IS MY PLAN? Most people should limit their sodium intake to 2,300 mg a day. Your health care provider recommends that you limit your sodium intake to __________ a day.  WHAT DO  I NEED TO KNOW ABOUT THIS EATING PLAN? For the low-sodium eating plan, you will follow these general guidelines:  Choose foods with a % Daily Value for sodium of less than 5% (as listed on the food label).   Use salt-free seasonings or herbs instead of table salt or sea salt.   Check with your health care provider or pharmacist before using salt substitutes.   Eat fresh foods.  Eat more vegetables and fruits.  Limit canned vegetables. If you do use them, rinse them well to decrease the sodium.   Limit cheese to 1 oz (28 g) per day.   Eat lower-sodium products, often labeled as "lower sodium" or "no salt added."  Avoid foods that contain monosodium glutamate (MSG). MSG is sometimes added to [place] food and some canned foods.  Check food labels (Nutrition Facts labels) on foods to learn how much sodium is in one serving.  Eat more home-cooked food and less restaurant, buffet, and fast food.  When eating at a restaurant, ask that your food be prepared with less salt, or no salt if possible.  HOW DO I READ FOOD LABELS FOR SODIUM INFORMATION? The Nutrition Facts label lists the amount of sodium in one serving of the food. If you eat more than one serving, you must multiply the listed amount of sodium by the number of servings. Food labels may also identify foods as:  Sodium free--Less than 5 mg in a serving.  Very low sodium--35 mg or less in a serving.  Low sodium--140 mg or less in a serving.  Light in sodium--50% less sodium in a serving. For example, if a food that  usually has 300 mg of sodium is changed to become light in sodium, it will have 150 mg of sodium.  Reduced sodium--25% less sodium in a serving. For example, if a food that usually has 400 mg of sodium is changed to reduced sodium, it will have 300 mg of sodium. WHAT FOODS CAN I EAT? Grains Low-sodium cereals, including oats, puffed wheat and rice, and shredded wheat cereals. Low-sodium crackers.  Unsalted rice and pasta. Lower-sodium bread.  Vegetables Frozen or fresh vegetables. Low-sodium or reduced-sodium canned vegetables. Low-sodium or reduced-sodium tomato sauce and paste. Low-sodium or reduced-sodium tomato and vegetable juices.  Fruits Fresh, frozen, and canned fruit. Fruit juice.  Meat and Other Protein Products Low-sodium canned tuna and salmon. Fresh or frozen meat, poultry, seafood, and fish. Lamb. Unsalted nuts. Dried beans, peas, and lentils without added salt. Unsalted canned beans. Homemade soups without salt. Eggs.  Dairy Milk. Soy milk. Ricotta cheese. Low-sodium or reduced-sodium cheeses. Yogurt.  Condiments Fresh and dried herbs and spices. Salt-free seasonings. Onion and garlic powders. Low-sodium varieties of mustard and ketchup. Fresh or refrigerated horseradish. Lemon juice.  Fats and Oils Reduced-sodium salad dressings. Unsalted butter.  Other Unsalted popcorn and pretzels.  The items listed above may not be a complete list of recommended foods or beverages. Contact your dietitian for more options. WHAT FOODS ARE NOT RECOMMENDED? Grains Instant hot cereals. Bread stuffing, pancake, and biscuit mixes. Croutons. Seasoned rice or pasta mixes. Noodle soup cups. Boxed or frozen macaroni and cheese. Self-rising flour. Regular salted crackers. Vegetables Regular canned vegetables. Regular canned tomato sauce and paste. Regular tomato and vegetable juices. Frozen vegetables in sauces. Salted [place] fries. Olives. Angie Fava. Relishes. Sauerkraut. Salsa. Meat and Other Protein Products Salted, canned, smoked, spiced, or pickled meats, seafood, or fish. Bacon, ham, sausage, hot dogs, corned beef, chipped beef, and packaged luncheon meats. Salt pork. Jerky. Pickled herring. Anchovies, regular canned tuna, and sardines. Salted nuts. Dairy Processed cheese and cheese spreads. Cheese curds. Blue cheese and cottage cheese. Buttermilk.  Condiments Onion and  garlic salt, seasoned salt, table salt, and sea salt. Canned and packaged gravies. Worcestershire sauce. Tartar sauce. Barbecue sauce. Teriyaki sauce. Soy sauce, including reduced sodium. Steak sauce. Fish sauce. Oyster sauce. Cocktail sauce. Horseradish that you find on the shelf. Regular ketchup and mustard. Meat flavorings and tenderizers. Bouillon cubes. Hot sauce. Tabasco sauce. Marinades. Taco seasonings. Relishes. Fats and Oils Regular salad dressings. Salted butter. Margarine. Ghee. Bacon fat.  Other Potato and tortilla chips. Corn chips and puffs. Salted popcorn and pretzels. Canned or dried soups. Pizza. Frozen entrees and pot pies.  The items listed above may not be a complete list of foods and beverages to avoid. Contact your dietitian for more information.   This information is not intended to replace advice given to you by your health care provider. Make sure you discuss any questions you have with your health care provider.   Document Released: 03/11/2002 Document Revised: 10/10/2014 Document Reviewed: 07/24/2013 Elsevier Interactive Patient Education 2016 Elsevier Inc.  - Smoking Cessation, Tips for Success If you are ready to quit smoking, congratulations! You have chosen to help yourself be healthier. Cigarettes bring nicotine, tar, carbon monoxide, and other irritants into your body. Your lungs, heart, and blood vessels will be able to work better without these poisons. There are many different ways to quit smoking. Nicotine gum, nicotine patches, a nicotine inhaler, or nicotine nasal spray can help with physical craving. Hypnosis, support groups, and medicines help break the habit of smoking.  WHAT THINGS CAN I DO TO MAKE QUITTING EASIER?  Here are some tips to help you quit for good:  Pick a date when you will quit smoking completely. Tell all of your friends and family about your plan to quit on that date.  Do not try to slowly cut down on the number of cigarettes you  are smoking. Pick a quit date and quit smoking completely starting on that day.  Throw away all cigarettes.   Clean and remove all ashtrays from your home, work, and car.  On a card, write down your reasons for quitting. Carry the card with you and read it when you get the urge to smoke.  Cleanse your body of nicotine. Drink enough water and fluids to keep your urine clear or pale yellow. Do this after quitting to flush the nicotine from your body.  Learn to predict your moods. Do not let a bad situation be your excuse to have a cigarette. Some situations in your life might tempt you into wanting a cigarette.  Never have "just one" cigarette. It leads to wanting another and another. Remind yourself of your decision to quit.  Change habits associated with smoking. If you smoked while driving or when feeling stressed, try other activities to replace smoking. Stand up when drinking your coffee. Brush your teeth after eating. Sit in a different chair when you read the paper. Avoid alcohol while trying to quit, and try to drink fewer caffeinated beverages. Alcohol and caffeine may urge you to smoke.  Avoid foods and drinks that can trigger a desire to smoke, such as sugary or spicy foods and alcohol.  Ask people who smoke not to smoke around you.  Have something planned to do right after eating or having a cup of coffee. For example, plan to take a walk or exercise.  Try a relaxation exercise to calm you down and decrease your stress. Remember, you may be tense and nervous for the first 2 weeks after you quit, but this will pass.  Find new activities to keep your hands busy. Play with a pen, coin, or rubber band. Doodle or draw things on paper.  Brush your teeth right after eating. This will help cut down on the craving for the taste of tobacco after meals. You can also try mouthwash.   Use oral substitutes in place of cigarettes. Try using lemon drops, carrots, cinnamon sticks, or chewing  gum. Keep them handy so they are available when you have the urge to smoke.  When you have the urge to smoke, try deep breathing.  Designate your home as a nonsmoking area.  If you are a heavy smoker, ask your health care provider about a prescription for nicotine chewing gum. It can ease your withdrawal from nicotine.  Reward yourself. Set aside the cigarette money you save and buy yourself something nice.  Look for support from others. Join a support group or smoking cessation program. Ask someone at home or at work to help you with your plan to quit smoking.  Always ask yourself, "Do I need this cigarette or is this just a reflex?" Tell yourself, "Today, I choose not to smoke," or "I do not want to smoke." You are reminding yourself of your decision to quit.  Do not replace cigarette smoking with electronic cigarettes (commonly called e-cigarettes). The safety of e-cigarettes is unknown, and some may contain harmful chemicals.  If you relapse, do not give up! Plan ahead and think about what you will  do the next time you get the urge to smoke. HOW WILL I FEEL WHEN I QUIT SMOKING? You may have symptoms of withdrawal because your body is used to nicotine (the addictive substance in cigarettes). You may crave cigarettes, be irritable, feel very hungry, cough often, get headaches, or have difficulty concentrating. The withdrawal symptoms are only temporary. They are strongest when you first quit but will go away within 10-14 days. When withdrawal symptoms occur, stay in control. Think about your reasons for quitting. Remind yourself that these are signs that your body is healing and getting used to being without cigarettes. Remember that withdrawal symptoms are easier to treat than the major diseases that smoking can cause.  Even after the withdrawal is over, expect periodic urges to smoke. However, these cravings are generally short lived and will go away whether you smoke or not. Do not  smoke! WHAT RESOURCES ARE AVAILABLE TO HELP ME QUIT SMOKING? Your health care provider can direct you to community resources or hospitals for support, which may include:  Group support.  Education.  Hypnosis.  Therapy.   This information is not intended to replace advice given to you by your health care provider. Make sure you discuss any questions you have with your health care provider.   Document Released: 06/17/2004 Document Revised: 10/10/2014 Document Reviewed: 03/07/2013 Elsevier Interactive Patient Education [employer].

## 2016-04-12 ENCOUNTER — Other Ambulatory Visit: Payer: Self-pay | Admitting: Internal Medicine

## 2016-04-12 LAB — HEMOGLOBIN A1C
HEMOGLOBIN A1C: 6.6 % — AB (ref <5.7)
MEAN PLASMA GLUCOSE: 143 mg/dL

## 2016-04-12 LAB — PSA, MEDICARE: PSA: 1.86 ng/mL (ref <=4.00)

## 2016-04-12 MED ORDER — METFORMIN HCL ER 500 MG PO TB24: 500.0000 mg | ORAL_TABLET | Freq: Every day | ORAL | Status: DC

## 2016-04-12 MED ORDER — PRAVASTATIN SODIUM 20 MG PO TABS: 20.0000 mg | ORAL_TABLET | Freq: Every day | ORAL | Status: DC

## 2016-04-12 MED FILL — PRAVASTATIN NA 20 MG TAB: 20 | 90 days supply | Qty: 90 | Fill #0

## 2016-04-12 MED FILL — METFORMIN HCL ER 500 MG TAB: 500 | 30 days supply | Qty: 30 | Fill #0

## 2016-04-15 ENCOUNTER — Telehealth: Payer: Self-pay

## 2016-04-15 NOTE — Telephone Encounter (Signed)
Contacted pt to go over lab results pt is aware of lab results. Pt doesn't have any questions or concerns. Pt will be coming by on Monday to pick medicine up

## 2016-04-22 ENCOUNTER — Encounter: Payer: Self-pay | Admitting: Endocrinology

## 2016-04-22 ENCOUNTER — Ambulatory Visit (INDEPENDENT_AMBULATORY_CARE_PROVIDER_SITE_OTHER): Payer: BLUE CROSS/BLUE SHIELD | Admitting: Endocrinology

## 2016-04-22 VITALS — BP 130/80 | HR 89 | Temp 99.0°F | Ht 68.0 in | Wt 205.5 lb

## 2016-04-22 DIAGNOSIS — C73 Malignant neoplasm of thyroid gland: Secondary | ICD-10-CM | POA: Insufficient documentation

## 2016-04-22 NOTE — Progress Notes (Signed)
Subjective:    Patient ID: Dalton Arnold, male    DOB: [birth year], 50 y.o.   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  He did not return for f/u. He reports 2 weeks of slight pain at the anterior neck, but no assoc swelling.  He takes no thyroid medication. Past Medical History  Diagnosis Date  . Diarrhea   . Thyroid mass     Past Surgical History  Procedure Laterality Date  . Nasal fracture surgery    . Esophagogastroduodenoscopy N/A 06/27/2014    Procedure: ESOPHAGOGASTRODUODENOSCOPY (EGD);  Surgeon: Beryle Beams, MD;  Location: [facility];  Service: Endoscopy;  Laterality: N/A;  . Hernia repair      x2  . Thyroidectomy Left 08/19/2014    Procedure: LEFT THYROID LOBECTOMY;  Surgeon: Rozetta Nunnery, MD;  Location: [facility];  Service: ENT;  Laterality: Left;    Social History   Social History  . Marital Status: Single    Spouse Name: N/A  . Number of Children: N/A  . Years of Education: N/A   Occupational History  . Not on file.   Social History Main Topics  . Smoking status: Current Every Day Smoker -- 1.00 packs/day    Types: Cigarettes  . Smokeless tobacco: Never Used  . Alcohol Use: Yes  . Drug Use: No  . Sexual Activity: Yes   Other Topics Concern  . Not on file   Social History Narrative    Current Outpatient Prescriptions on File Prior to Visit  Medication Sig Dispense Refill  . aspirin 325 MG tablet Take 325 mg by mouth daily.    Marland Kitchen lisinopril-hydrochlorothiazide (PRINZIDE,ZESTORETIC) 20-25 MG tablet Take 1 tablet by mouth daily. 90 tablet 3  . metFORMIN (GLUCOPHAGE XR) 500 MG 24 hr tablet Take 1 tablet (500 mg total) by mouth daily with breakfast. 90 tablet 3  . naproxen sodium (ANAPROX) 220 MG tablet Take 220 mg by mouth 2 (two) times daily with a meal. Reported on 04/11/2016    . pravastatin  (PRAVACHOL) 20 MG tablet Take 1 tablet (20 mg total) by mouth daily. 90 tablet 3  . buPROPion (WELLBUTRIN SR) 150 MG 12 hr tablet Take 1 tablet (150 mg total) by mouth 2 (two) times daily. (Patient not taking: Reported on 04/22/2016) 60 tablet 3   No current facility-administered medications on file prior to visit.    Allergies  Allergen Reactions  . Acyclovir And Related   . Eggs Or Egg-Derived Products Rash  . Nicoderm [Nicotine] Rash    Family History  Problem Relation Age of Onset  . Thyroid disease Neg Hx     BP 130/80 mmHg  Pulse 89  Temp(Src) 99 F (37.2 C) (Oral)  Ht 5\' 8"  (1.727 m)  Wt 205 lb 8 oz (93.214 kg)  BMI 31.25 kg/m2  SpO2 97%  Review of Systems He has gained a few lbs.  No hoarseness.      Objective:   Physical Exam VITAL SIGNS:  See vs page GENERAL: no distress Neck: a healed scar is present.  i do not appreciate a nodule in the thyroid or elsewhere in the neck.   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 I  advised completion lobectomy, but he declines.   Noncompliance with f/u.  I advised pt is risks.   Patient is advised the following: Patient Instructions  Please take a radioactive iodine pill, to destroy the remaining thyroid.  you will receive a phone call, about a day and time for an appointment You would go back a few days later, to lie down in front of a camera.  Then come back here, approx 1 week later.   Renato Shin, MD

## 2016-04-22 NOTE — Progress Notes (Signed)
Pre visit review using our clinic review tool, if applicable. No additional management support is needed unless otherwise documented below in the visit note. 

## 2016-04-22 NOTE — Patient Instructions (Addendum)
Please take a radioactive iodine pill, to destroy the remaining thyroid.  you will receive a phone call, about a day and time for an appointment You would go back a few days later, to lie down in front of a camera.  Then come back here, approx 1 week later.

## 2016-04-28 ENCOUNTER — Telehealth: Payer: Self-pay | Admitting: Internal Medicine

## 2016-04-28 NOTE — Telephone Encounter (Signed)
Per amber at [facility] radiology would like  Dr Loanne Drilling to call Dr Leonia Reeves [phone] about Dalton Arnold

## 2016-04-28 NOTE — Telephone Encounter (Signed)
This is a non working number

## 2016-04-29 ENCOUNTER — Ambulatory Visit (HOSPITAL_COMMUNITY): Payer: BLUE CROSS/BLUE SHIELD

## 2016-04-29 ENCOUNTER — Telehealth: Payer: Self-pay

## 2016-04-29 NOTE — Telephone Encounter (Signed)
Please call Dr. Ilda Foil [phone] has questions about this pt, he's waiting on this phone call. Also pt is requesting a call back he has no idea of what's going on per Amber at radiology.

## 2016-04-29 NOTE — Telephone Encounter (Signed)
I called back and we discussed.  He'll get approx 30 mci, and it is understood he may need a 2nd dose.

## 2016-05-13 ENCOUNTER — Encounter (HOSPITAL_COMMUNITY): Payer: BLUE CROSS/BLUE SHIELD

## 2016-05-23 ENCOUNTER — Encounter (HOSPITAL_COMMUNITY): Payer: BLUE CROSS/BLUE SHIELD

## 2016-06-20 ENCOUNTER — Encounter: Payer: Self-pay | Admitting: Internal Medicine

## 2016-06-20 ENCOUNTER — Ambulatory Visit: Payer: BLUE CROSS/BLUE SHIELD | Attending: Internal Medicine | Admitting: Internal Medicine

## 2016-06-20 VITALS — BP 139/91 | HR 76 | Temp 98.6°F | Resp 16 | Wt 208.0 lb

## 2016-06-20 DIAGNOSIS — E119 Type 2 diabetes mellitus without complications: Secondary | ICD-10-CM | POA: Diagnosis not present

## 2016-06-20 DIAGNOSIS — Z91012 Allergy to eggs: Secondary | ICD-10-CM | POA: Insufficient documentation

## 2016-06-20 DIAGNOSIS — Z79899 Other long term (current) drug therapy: Secondary | ICD-10-CM | POA: Insufficient documentation

## 2016-06-20 DIAGNOSIS — Z888 Allergy status to other drugs, medicaments and biological substances status: Secondary | ICD-10-CM | POA: Insufficient documentation

## 2016-06-20 DIAGNOSIS — Z1211 Encounter for screening for malignant neoplasm of colon: Secondary | ICD-10-CM

## 2016-06-20 DIAGNOSIS — E669 Obesity, unspecified: Secondary | ICD-10-CM

## 2016-06-20 DIAGNOSIS — Z23 Encounter for immunization: Secondary | ICD-10-CM

## 2016-06-20 DIAGNOSIS — D34 Benign neoplasm of thyroid gland: Secondary | ICD-10-CM | POA: Diagnosis not present

## 2016-06-20 DIAGNOSIS — Z7984 Long term (current) use of oral hypoglycemic drugs: Secondary | ICD-10-CM | POA: Insufficient documentation

## 2016-06-20 DIAGNOSIS — Z85850 Personal history of malignant neoplasm of thyroid: Secondary | ICD-10-CM | POA: Diagnosis not present

## 2016-06-20 DIAGNOSIS — I10 Essential (primary) hypertension: Secondary | ICD-10-CM

## 2016-06-20 DIAGNOSIS — Z114 Encounter for screening for human immunodeficiency virus [HIV]: Secondary | ICD-10-CM

## 2016-06-20 DIAGNOSIS — Z118 Encounter for screening for other infectious and parasitic diseases: Secondary | ICD-10-CM | POA: Insufficient documentation

## 2016-06-20 DIAGNOSIS — E1169 Type 2 diabetes mellitus with other specified complication: Secondary | ICD-10-CM

## 2016-06-20 DIAGNOSIS — Z7982 Long term (current) use of aspirin: Secondary | ICD-10-CM | POA: Insufficient documentation

## 2016-06-20 DIAGNOSIS — Z1159 Encounter for screening for other viral diseases: Secondary | ICD-10-CM

## 2016-06-20 DIAGNOSIS — N400 Enlarged prostate without lower urinary tract symptoms: Secondary | ICD-10-CM | POA: Diagnosis not present

## 2016-06-20 LAB — GLUCOSE, POCT (MANUAL RESULT ENTRY): POC Glucose: 166 mg/dl — AB (ref 70–99)

## 2016-06-20 MED ORDER — TAMSULOSIN HCL 0.4 MG PO CAPS: 0.4000 mg | ORAL_CAPSULE | Freq: Every day | ORAL | 3 refills | Status: DC

## 2016-06-20 MED ORDER — METFORMIN HCL ER 500 MG PO TB24: 500.0000 mg | ORAL_TABLET | Freq: Every day | ORAL | 3 refills | Status: DC

## 2016-06-20 MED ORDER — LISINOPRIL-HYDROCHLOROTHIAZIDE 20-25 MG PO TABS: 1.0000 | ORAL_TABLET | Freq: Every day | ORAL | 3 refills | Status: DC

## 2016-06-20 MED FILL — LISINOPRIL-HCTZ 20-25 MG TA: 20-25 | 90 days supply | Qty: 90 | Fill #0

## 2016-06-20 MED FILL — METFORMIN HCL ER 500 MG TAB: 500 | 90 days supply | Qty: 90 | Fill #0

## 2016-06-20 MED FILL — TAMSULOSIN HCL 0.4 MG CAP: 0.4 | 30 days supply | Qty: 30 | Fill #0

## 2016-06-20 NOTE — Patient Instructions (Addendum)
Tdap Vaccine (Tetanus, Diphtheria and Pertussis): What You Need to Know 1. Why get vaccinated? Tetanus, diphtheria and pertussis are very serious diseases. Tdap vaccine can protect us from these diseases. And, Tdap vaccine given to pregnant women can protect newborn babies against pertussis. TETANUS (Lockjaw) is rare in the [place] today. It causes painful muscle tightening and stiffness, usually all over the body.  It can lead to tightening of muscles in the head and neck so you can't open your mouth, swallow, or sometimes even breathe. Tetanus kills about 1 out of 10 people who are infected even after receiving the best medical care. DIPHTHERIA is also rare in the [place] today. It can cause a thick coating to form in the back of the throat.  It can lead to breathing problems, heart failure, paralysis, and death. PERTUSSIS (Whooping Cough) causes severe coughing spells, which can cause difficulty breathing, vomiting and disturbed sleep.  It can also lead to weight loss, incontinence, and rib fractures. Up to 2 in 100 adolescents and 5 in 100 adults with pertussis are hospitalized or have complications, which could include pneumonia or death. These diseases are caused by bacteria. Diphtheria and pertussis are spread from person to person through secretions from coughing or sneezing. Tetanus enters the body through cuts, scratches, or wounds. Before vaccines, as many as 200,000 cases of diphtheria, 200,000 cases of pertussis, and hundreds of cases of tetanus, were reported in the [place] each year. Since vaccination began, reports of cases for tetanus and diphtheria have dropped by about 99% and for pertussis by about 80%. 2. Tdap vaccine Tdap vaccine can protect adolescents and adults from tetanus, diphtheria, and pertussis. One dose of Tdap is routinely given at age 11 or 12. People who did not get Tdap at that age should get it as soon as possible. Tdap is especially important  for healthcare professionals and anyone having close contact with a baby younger than 12 months. Pregnant women should get a dose of Tdap during every pregnancy, to protect the newborn from pertussis. Infants are most at risk for severe, life-threatening complications from pertussis. Another vaccine, called Td, protects against tetanus and diphtheria, but not pertussis. A Td booster should be given every 10 years. Tdap may be given as one of these boosters if you have never gotten Tdap before. Tdap may also be given after a severe cut or burn to prevent tetanus infection. Your doctor or the person giving you the vaccine can give you more information. Tdap may safely be given at the same time as other vaccines. 3. Some people should not get this vaccine  A person who has ever had a life-threatening allergic reaction after a previous dose of any diphtheria, tetanus or pertussis containing vaccine, OR has a severe allergy to any part of this vaccine, should not get Tdap vaccine. Tell the person giving the vaccine about any severe allergies.  Anyone who had coma or long repeated seizures within 7 days after a childhood dose of DTP or DTaP, or a previous dose of Tdap, should not get Tdap, unless a cause other than the vaccine was found. They can still get Td.  Talk to your doctor if you:  have seizures or another nervous system problem,  had severe pain or swelling after any vaccine containing diphtheria, tetanus or pertussis,  ever had a condition called Guillain-Barr Syndrome (GBS),  aren't feeling well on the day the shot is scheduled. 4. Risks With any medicine, including vaccines, there is   a chance of side effects. These are usually mild and go away on their own. Serious reactions are also possible but are rare. Most people who get Tdap vaccine do not have any problems with it. Mild problems following Tdap (Did not interfere with activities)  Pain where the shot was given (about 3 in 4  adolescents or 2 in 3 adults)  Redness or swelling where the shot was given (about 1 person in 5)  Mild fever of at least 100.4F (up to about 1 in 25 adolescents or 1 in 100 adults)  Headache (about 3 or 4 people in 10)  Tiredness (about 1 person in 3 or 4)  Nausea, vomiting, diarrhea, stomach ache (up to 1 in 4 adolescents or 1 in 10 adults)  Chills, sore joints (about 1 person in 10)  Body aches (about 1 person in 3 or 4)  Rash, swollen glands (uncommon) Moderate problems following Tdap (Interfered with activities, but did not require medical attention)  Pain where the shot was given (up to 1 in 5 or 6)  Redness or swelling where the shot was given (up to about 1 in 16 adolescents or 1 in 12 adults)  Fever over 102F (about 1 in 100 adolescents or 1 in 250 adults)  Headache (about 1 in 7 adolescents or 1 in 10 adults)  Nausea, vomiting, diarrhea, stomach ache (up to 1 or 3 people in 100)  Swelling of the entire arm where the shot was given (up to about 1 in 500). Severe problems following Tdap (Unable to perform usual activities; required medical attention)  Swelling, severe pain, bleeding and redness in the arm where the shot was given (rare). Problems that could happen after any vaccine:  People sometimes faint after a medical procedure, including vaccination. Sitting or lying down for about 15 minutes can help prevent fainting, and injuries caused by a fall. Tell your doctor if you feel dizzy, or have vision changes or ringing in the ears.  Some people get severe pain in the shoulder and have difficulty moving the arm where a shot was given. This happens very rarely.  Any medication can cause a severe allergic reaction. Such reactions from a vaccine are very rare, estimated at fewer than 1 in a million doses, and would happen within a few minutes to a few hours after the vaccination. As with any medicine, there is a very remote chance of a vaccine causing a serious  injury or death. The safety of vaccines is always being monitored. For more information, visit: www.cdc.gov/vaccinesafety/ 5. What if there is a serious problem? What should I look for?  Look for anything that concerns you, such as signs of a severe allergic reaction, very high fever, or unusual behavior.  Signs of a severe allergic reaction can include hives, swelling of the face and throat, difficulty breathing, a fast heartbeat, dizziness, and weakness. These would usually start a few minutes to a few hours after the vaccination. What should I do?  If you think it is a severe allergic reaction or other emergency that can't wait, call 9-1-1 or get the person to the nearest hospital. Otherwise, call your doctor.  Afterward, the reaction should be reported to the Vaccine Adverse Event Reporting System (VAERS). Your doctor might file this report, or you can do it yourself through the VAERS web site at www.vaers.hhs.gov, or by calling 1-800-822-7967. VAERS does not give medical advice.  6. The National Vaccine Injury Compensation Program The National Vaccine Injury Compensation Program (  to the nearest hospital. Otherwise, call your doctor.  · Afterward, the reaction should be reported to the Vaccine Adverse Event Reporting System (VAERS). Your doctor might file this report, or you can do it yourself through the VAERS web site at www.vaers.hhs.gov, or by calling 1-800-822-7967.  VAERS does not give medical advice.   6. The National Vaccine Injury Compensation Program  The National Vaccine Injury Compensation Program (VICP) is a federal program that was created to compensate people who may have been injured by certain vaccines.  Persons who believe they may have been injured by a vaccine can learn about the program and about filing a claim by calling 1-800-338-2382 or visiting the VICP website at www.hrsa.gov/vaccinecompensation. There is a time limit to file a claim for compensation.  7. How can I learn more?  · Ask your doctor. He or she can give you the vaccine package insert or suggest other sources of information.  · Call your local or state health department.  · Contact the Centers for Disease Control and Prevention (CDC):    Call 1-800-232-4636 (1-800-CDC-INFO) or    Visit CDC's website at www.cdc.gov/vaccines  CDC Tdap Vaccine VIS (11/26/13)     This information is not intended to replace advice given to you by your health care  provider. Make sure you discuss any questions you have with your health care provider.     Document Released: 03/20/2012 Document Revised: 10/10/2014 Document Reviewed: 01/01/2014  Elsevier Interactive Patient Education ©2016 Elsevier Inc.  Pneumococcal Polysaccharide Vaccine: What You Need to Know  1. Why get vaccinated?  Vaccination can protect older adults (and some children and younger adults) from pneumococcal disease.  Pneumococcal disease is caused by bacteria that can spread from person to person through close contact. It can cause ear infections, and it can also lead to more serious infections of the:   · Lungs (pneumonia),  · Blood (bacteremia), and  · Covering of the brain and spinal cord (meningitis). Meningitis can cause deafness and brain damage, and it can be fatal.  Anyone can get pneumococcal disease, but children under 2 years of age, people with certain medical conditions, adults over 65 years of age, and cigarette smokers are at the highest risk.  About 18,000 older adults die each year from pneumococcal disease in the [place].  Treatment of pneumococcal infections with penicillin and other drugs used to be more effective. But some strains of the disease have become resistant to these drugs. This makes prevention of the disease, through vaccination, even more important.  2. Pneumococcal polysaccharide vaccine (PPSV23)  Pneumococcal polysaccharide vaccine (PPSV23) protects against 23 types of pneumococcal bacteria. It will not prevent all pneumococcal disease.  PPSV23 is recommended for:  · All adults 65 years of age and older,  · Anyone 2 through 52 years of age with certain long-term health problems,  · Anyone 2 through 52 years of age with a weakened immune system,  · Adults 19 through 52 years of age who smoke cigarettes or have asthma.  Most people need only one dose of PPSV. A second dose is recommended for certain high-risk groups. People 65 and older should get a dose even if they  have gotten one or more doses of the vaccine before they turned 65.  Your healthcare provider can give you more information about these recommendations.  Most healthy adults develop protection within 2 to 3 weeks of getting the shot.  3. Some people should not get this vaccine  · Anyone   precaution, women who need the vaccine should be vaccinated before becoming pregnant, if possible. 4. Risks of a vaccine reaction With any medicine, including vaccines, there is a chance of side effects. These are usually mild and go away on their own, but serious reactions are also possible. About half of people who get PPSV have mild side effects, such as redness or pain where the shot is given, which go away within about two days. Less than 1 out of 100 people develop a fever, muscle aches, or more severe local reactions. Problems that could happen after any vaccine:  People sometimes faint after a medical procedure, including vaccination. Sitting or lying down for about 15 minutes can help prevent fainting, and injuries caused by a fall. Tell your doctor if you feel dizzy, or have vision changes or ringing in the ears.  Some people get severe pain in the shoulder and have difficulty moving the arm where a shot was given. This happens very rarely.  Any medication can cause a severe allergic reaction. Such reactions from a vaccine are very  rare, estimated at about 1 in a million doses, and would happen within a few minutes to a few hours after the vaccination. As with any medicine, there is a very remote chance of a vaccine causing a serious injury or death. The safety of vaccines is always being monitored. For more information, visit: http://www.aguilar.org/ 5. What if there is a serious reaction? What should I look for? Look for anything that concerns you, such as signs of a severe allergic reaction, very high fever, or unusual behavior.  Signs of a severe allergic reaction can include hives, swelling of the face and throat, difficulty breathing, a fast heartbeat, dizziness, and weakness. These would usually start a few minutes to a few hours after the vaccination. What should I do? If you think it is a severe allergic reaction or other emergency that can't wait, call 9-1-1 or get to the nearest hospital. Otherwise, call your doctor. Afterward, the reaction should be reported to the Vaccine Adverse Event Reporting System (VAERS). Your doctor might file this report, or you can do it yourself through the VAERS web site at www.vaers.SamedayNews.es, or by calling (931)855-7301.  VAERS does not give medical advice. 6. How can I learn more?  Ask your doctor. He or she can give you the vaccine package insert or suggest other sources of information.  Call your local or state health department.  Contact the Centers for Disease Control and Prevention (CDC):  Call (228)635-3994 (1-800-CDC-INFO) or  Visit CDC's website at http://hunter.com/ CDC Pneumococcal Polysaccharide Vaccine VIS (01/24/14)   This information is not intended to replace advice given to you by your health care provider. Make sure you discuss any questions you have with your health care provider.   Document Released: 07/17/2006 Document Revised: 10/10/2014 Document Reviewed: 01/27/2014 Elsevier Interactive Patient Education 2016 Elsevier Inc. Influenza Virus Vaccine  injection (Fluarix) What is this medicine? INFLUENZA VIRUS VACCINE (in floo EN zuh VAHY ruhs vak SEEN) helps to reduce the risk of getting influenza also known as the flu. This medicine may be used for other purposes; ask your health care provider or pharmacist if you have questions. What should I tell my health care provider before I take this medicine? They need to know if you have any of these conditions: -bleeding disorder like hemophilia -fever or infection -Guillain-Barre syndrome or other neurological problems -immune system problems -infection with the human immunodeficiency virus (HIV) or AIDS -low blood  platelet counts -multiple sclerosis -an unusual or allergic reaction to influenza virus vaccine, eggs, chicken proteins, latex, gentamicin, other medicines, foods, dyes or preservatives -pregnant or trying to get pregnant -breast-feeding How should I use this medicine? This vaccine is for injection into a muscle. It is given by a health care professional. A copy of Vaccine Information Statements will be given before each vaccination. Read this sheet carefully each time. The sheet may change frequently. Talk to your pediatrician regarding the use of this medicine in children. Special care may be needed. Overdosage: If you think you have taken too much of this medicine contact a poison control center or emergency room at once. NOTE: This medicine is only for you. Do not share this medicine with others. What if I miss a dose? This does not apply. What may interact with this medicine? -chemotherapy or radiation therapy -medicines that lower your immune system like etanercept, anakinra, infliximab, and adalimumab -medicines that treat or prevent blood clots like warfarin -phenytoin -steroid medicines like prednisone or cortisone -theophylline -vaccines This list may not describe all possible interactions. Give your health care provider a list of all the medicines, herbs,  non-prescription drugs, or dietary supplements you use. Also tell them if you smoke, drink alcohol, or use illegal drugs. Some items may interact with your medicine. What should I watch for while using this medicine? Report any side effects that do not go away within 3 days to your doctor or health care professional. Call your health care provider if any unusual symptoms occur within 6 weeks of receiving this vaccine. You may still catch the flu, but the illness is not usually as bad. You cannot get the flu from the vaccine. The vaccine will not protect against colds or other illnesses that may cause fever. The vaccine is needed every year. What side effects may I notice from receiving this medicine? Side effects that you should report to your doctor or health care professional as soon as possible: -allergic reactions like skin rash, itching or hives, swelling of the face, lips, or tongue Side effects that usually do not require medical attention (report to your doctor or health care professional if they continue or are bothersome): -fever -headache -muscle aches and pains -pain, tenderness, redness, or swelling at site where injected -weak or tired This list may not describe all possible side effects. Call your doctor for medical advice about side effects. You may report side effects to FDA at 1-800-FDA-1088. Where should I keep my medicine? This vaccine is only given in a clinic, pharmacy, doctor's office, or other health care setting and will not be stored at home. NOTE: This sheet is a summary. It may not cover all possible information. If you have questions about this medicine, talk to your doctor, pharmacist, or health care provider.    2016, Elsevier/Gold Standard. (2008-04-16 09:30:40)   -  Benign Prostatic Hypertrophy The prostate gland is part of the reproductive system of men. A normal prostate is about the size and shape of a walnut. The prostate gland produces a fluid that is  mixed with sperm to make semen. This gland surrounds the urethra and is located in front of the rectum and just below the bladder. The bladder is where urine is stored. The urethra is the tube through which urine passes from the bladder to get out of the body. The prostate grows as a man ages. An enlarged prostate not caused by cancer is called benign prostatic hypertrophy (BPH). An enlarged prostate  can press on the urethra. This can make it harder to pass urine. In the early stages of enlargement, the bladder can get by with a narrowed urethra by forcing the urine through. If the problem gets worse, medical or surgical treatment may be required.  This condition should be followed by your health care provider. The accumulation of urine in the bladder can cause infection. Back pressure and infection can progress to bladder damage and kidney (renal) failure. If needed, your health care provider may refer you to a specialist in kidney and prostate disease (urologist). CAUSES  BPH is a common health problem in men older than 50 years. This condition is a normal part of aging. However, not all men will develop problems from this condition. If the enlargement grows away from the urethra, then there will not be any compression of the urethra and resistance to urine flow.If the growth is toward the urethra and compresses it, you will experience difficulty urinating.  SYMPTOMS   Not able to completely empty your bladder.  Getting up often during the night to urinate.  Need to urinate frequently during the day.  Difficultly starting urine flow.  Decrease in size and strength of your urine stream.  Dribbling after urination.  Pain on urination (more common with infection).  Inability to pass urine. This needs immediate treatment.  The development of a urinary tract infection. DIAGNOSIS  These tests will help your health care provider understand your problem:  A thorough history and physical  examination.  A urination history, with the number of times you urinate, the amounts of urine, the strength of the urine stream, and the feeling of emptiness or fullness after urinating.  A postvoid bladder scan that measures any amount of urine that may remain in your bladder after you finish urinating.  Digital rectal exam. In a rectal exam, your health care provider checks your prostate by putting a gloved, lubricated finger into your rectum to feel the back of your prostate gland. This exam detects the size of your gland and abnormal lumps or growths.  Exam of your urine (urinalysis).  Prostate specific antigen (PSA) screening. This is a blood test used to screen for prostate cancer.  Rectal ultrasonography. This test uses sound waves to electronically produce a picture of your prostate gland. TREATMENT  Once symptoms begin, your health care provider will monitor your condition. Of the men with this condition, one third will have symptoms that stabilize, one third will have symptoms that improve, and one third will have symptoms that progress in the first year. Mild symptoms may not need treatment. Simple observation and yearly exams may be all that is required. Medicines and surgery are options for more severe problems. Your health care provider can help you make an informed decision for what is best. Two classes of medicines are available for relief of prostate symptoms:  Medicines that shrink the prostate. This helps relieve symptoms. These medicines take time to work, and it may be months before any improvement is seen.  Uncommon side effects include problems with sexual function.  Medicines to relax the muscle of the prostate. This also relieves the obstruction by reducing any compression on the urethra.This group of medicines work much faster than those that reduce the size of the prostate gland. Usually, one can experience improvement in days to weeks..  Side effects can include  dizziness, fatigue, lightheadedness, and retrograde ejaculation (diminished volume of ejaculate). Several types of surgical treatments are available for relief of prostate  symptoms:  Transurethral resection of the prostate (TURP)--In this treatment, an instrument is inserted through opening at the tip of the penis. It is used to cut away pieces of the inner core of the prostate. The pieces are removed through the same opening of the penis. This removes the obstruction and helps get rid of the symptoms.  Transurethral incision (TUIP)--In this procedure, small cuts are made in the prostate. This lessens the prostates pressure on the urethra.  Transurethral microwave thermotherapy (TUMT)--This procedure uses microwaves to create heat. The heat destroys and removes a small amount of prostate tissue.  Transurethral needle ablation (TUNA)--This is a procedure that uses radio frequencies to do the same as TUMT.  Interstitial laser coagulation (ILC)--This is a procedure that uses a laser to do the same as TUMT and TUNA.  Transurethral electrovaporization (TUVP)--This is a procedure that uses electrodes to do the same as the procedures listed above. SEEK MEDICAL CARE IF:   You develop a fever.  There is unexplained back pain.  Symptoms are not helped by medicines prescribed.  You develop side effects from the medicine you are taking.  Your urine becomes very dark or has a bad smell.  Your lower abdomen becomes distended and you have difficulty passing your urine. SEEK IMMEDIATE MEDICAL CARE IF:   You are suddenly unable to urinate. This is an emergency. You should be seen immediately.  There are large amounts of blood or clots in the urine.  Your urinary problems become unmanageable.  You develop lightheadedness, severe dizziness, or you feel faint.  You develop moderate to severe low back or flank pain.  You develop chills or fever.   This information is not intended to replace  advice given to you by your health care provider. Make sure you discuss any questions you have with your health care provider.   Document Released: 09/19/2005 Document Revised: 09/24/2013 Document Reviewed: 04/04/2013 Elsevier Interactive Patient Education 2016 [place] Maintenance, Male A healthy lifestyle and preventative care can promote health and wellness.  Maintain regular health, dental, and eye exams.  Eat a healthy diet. Foods like vegetables, fruits, whole grains, low-fat dairy products, and lean protein foods contain the nutrients you need and are low in calories. Decrease your intake of foods high in solid fats, added sugars, and salt. Get information about a proper diet from your health care provider, if necessary.  Regular physical exercise is one of the most important things you can do for your health. Most adults should get at least 150 minutes of moderate-intensity exercise (any activity that increases your heart rate and causes you to sweat) each week. In addition, most adults need muscle-strengthening exercises on 2 or more days a week.   Maintain a healthy weight. The body mass index (BMI) is a screening tool to identify possible weight problems. It provides an estimate of body fat based on height and weight. Your health care provider can find your BMI and can help you achieve or maintain a healthy weight. For males 20 years and older:  A BMI below 18.5 is considered underweight.  A BMI of 18.5 to 24.9 is normal.  A BMI of 25 to 29.9 is considered overweight.  A BMI of 30 and above is considered obese.  Maintain normal blood lipids and cholesterol by exercising and minimizing your intake of saturated fat. Eat a balanced diet with plenty of fruits and vegetables. Blood tests for lipids and cholesterol should begin at age 74 and be  repeated every 5 years. If your lipid or cholesterol levels are high, you are over age 70, or you are at high risk for heart  disease, you may need your cholesterol levels checked more frequently.Ongoing high lipid and cholesterol levels should be treated with medicines if diet and exercise are not working.  If you smoke, find out from your health care provider how to quit. If you do not use tobacco, do not start.  Lung cancer screening is recommended for adults aged 36-80 years who are at high risk for developing lung cancer because of a history of smoking. A yearly low-dose CT scan of the lungs is recommended for people who have at least a 30-pack-year history of smoking and are current smokers or have quit within the past 15 years. A pack year of smoking is smoking an average of 1 pack of cigarettes a day for 1 year (for example, a 30-pack-year history of smoking could mean smoking 1 pack a day for 30 years or 2 packs a day for 15 years). Yearly screening should continue until the smoker has stopped smoking for at least 15 years. Yearly screening should be stopped for people who develop a health problem that would prevent them from having lung cancer treatment.  If you choose to drink alcohol, do not have more than 2 drinks per day. One drink is considered to be 12 oz (360 mL) of beer, 5 oz (150 mL) of wine, or 1.5 oz (45 mL) of liquor.  Avoid the use of street drugs. Do not share needles with anyone. Ask for help if you need support or instructions about stopping the use of drugs.  High blood pressure causes heart disease and increases the risk of stroke. High blood pressure is more likely to develop in:  People who have blood pressure in the end of the normal range (100-139/85-89 mm Hg).  People who are overweight or obese.  People who are African American.  If you are 14-76 years of age, have your blood pressure checked every 3-5 years. If you are 39 years of age or older, have your blood pressure checked every year. You should have your blood pressure measured twice--once when you are at a hospital or clinic, and  once when you are not at a hospital or clinic. Record the average of the two measurements. To check your blood pressure when you are not at a hospital or clinic, you can use:  An automated blood pressure machine at a pharmacy.  A home blood pressure monitor.  If you are 29-11 years old, ask your health care provider if you should take aspirin to prevent heart disease.  Diabetes screening involves taking a blood sample to check your fasting blood sugar level. This should be done once every 3 years after age 62 if you are at a normal weight and without risk factors for diabetes. Testing should be considered at a younger age or be carried out more frequently if you are overweight and have at least 1 risk factor for diabetes.  Colorectal cancer can be detected and often prevented. Most routine colorectal cancer screening begins at the age of 39 and continues through age 83. However, your health care provider may recommend screening at an earlier age if you have risk factors for colon cancer. On a yearly basis, your health care provider may provide home test kits to check for hidden blood in the stool. A small camera at the end of a tube may be used to  directly examine the colon (sigmoidoscopy or colonoscopy) to detect the earliest forms of colorectal cancer. Talk to your health care provider about this at age 84 when routine screening begins. A direct exam of the colon should be repeated every 5-10 years through age 69, unless early forms of precancerous polyps or small growths are found.  People who are at an increased risk for hepatitis B should be screened for this virus. You are considered at high risk for hepatitis B if:  You were born in a country where hepatitis B occurs often. Talk with your health care provider about which countries are considered high risk.  Your parents were born in a high-risk country and you have not received a shot to protect against hepatitis B (hepatitis B  vaccine).  You have HIV or AIDS.  You use needles to inject street drugs.  You live with, or have sex with, someone who has hepatitis B.  You are a man who has sex with other men (MSM).  You get hemodialysis treatment.  You take certain medicines for conditions like cancer, organ transplantation, and autoimmune conditions.  Hepatitis C blood testing is recommended for all people born from [birth year] through 1967 and any individual with known risk factors for hepatitis C.  Healthy men should no longer receive prostate-specific antigen (PSA) blood tests as part of routine cancer screening. Talk to your health care provider about prostate cancer screening.  Testicular cancer screening is not recommended for adolescents or adult males who have no symptoms. Screening includes self-exam, a health care provider exam, and other screening tests. Consult with your health care provider about any symptoms you have or any concerns you have about testicular cancer.  Practice safe sex. Use condoms and avoid high-risk sexual practices to reduce the spread of sexually transmitted infections (STIs).  You should be screened for STIs, including gonorrhea and chlamydia if:  You are sexually active and are younger than 24 years.  You are older than 24 years, and your health care provider tells you that you are at risk for this type of infection.  Your sexual activity has changed since you were last screened, and you are at an increased risk for chlamydia or gonorrhea. Ask your health care provider if you are at risk.  If you are at risk of being infected with HIV, it is recommended that you take a prescription medicine daily to prevent HIV infection. This is called pre-exposure prophylaxis (PrEP). You are considered at risk if:  You are a man who has sex with other men (MSM).  You are a heterosexual man who is sexually active with multiple partners.  You take drugs by injection.  You are sexually active  with a partner who has HIV.  Talk with your health care provider about whether you are at high risk of being infected with HIV. If you choose to begin PrEP, you should first be tested for HIV. You should then be tested every 3 months for as long as you are taking PrEP.  Use sunscreen. Apply sunscreen liberally and repeatedly throughout the day. You should seek shade when your shadow is shorter than you. Protect yourself by wearing long sleeves, pants, a wide-brimmed hat, and sunglasses year round whenever you are outdoors.  Tell your health care provider of new moles or changes in moles, especially if there is a change in shape or color. Also, tell your health care provider if a mole is larger than the size of a pencil eraser.  A one-time screening for abdominal aortic aneurysm (AAA) and surgical repair of large AAAs by ultrasound is recommended for men aged 24-75 years who are current or former smokers.  Stay current with your vaccines (immunizations).   This information is not intended to replace advice given to you by your health care provider. Make sure you discuss any questions you have with your health care provider.   Document Released: 03/17/2008 Document Revised: 10/10/2014 Document Reviewed: 02/14/2011 Elsevier Interactive Patient Education [employer].

## 2016-06-20 NOTE — Progress Notes (Signed)
Pt is in the office today for a follow-up Pt states he is not in any pain Pt states he is taking his medication without difficulties

## 2016-06-20 NOTE — Progress Notes (Signed)
Dalton Arnold, is a 50 y.o. male  [id number]  [id number]  DOB - 1965/12/11  Chief Complaint  Patient presents with  . Follow-up        Subjective:   Dalton Arnold is a 50 y.o. male here today for a follow up visit., last seen in clinic 04/11/16, here for f/u of dm /htn/ thyroid adenoma. He has since f/u w/ Endo 04/22/16 for thyroid cancer, recd completion lobectomy, for which he declined. He was subsequently set up for radioactive iodine treatment, which he does not want to do either.  He understands the risk, but says "God will protect him", and treatment too costly out of pocket for him at this time (has high $7k deductible).  He c/o of increase uop, frequency and noctoria, feels like does not empty all the way. Denies dysuria.  He states he ran out of his meds about 1 month ago and did not know he had refills.   Patient has No headache, No chest pain, No abdominal pain - No Nausea, No new weakness tingling or numbness, No Cough - SOB.  Problem  Diabetes Mellitus Type 2 in Obese ([facility])  Htn (Hypertension), Benign    ALLERGIES: Allergies  Allergen Reactions  . Acyclovir And Related   . Eggs Or Egg-Derived Products Rash  . Nicoderm [Nicotine] Rash    PAST MEDICAL HISTORY: Past Medical History:  Diagnosis Date  . Diarrhea   . Thyroid mass     MEDICATIONS AT HOME: Prior to Admission medications   Medication Sig Start Date End Date Taking? Authorizing Provider  aspirin 325 MG tablet Take 325 mg by mouth daily.   Yes Historical Provider, MD  buPROPion (WELLBUTRIN SR) 150 MG 12 hr tablet Take 1 tablet (150 mg total) by mouth 2 (two) times daily. 04/11/16  Yes Maren Reamer, MD  lisinopril-hydrochlorothiazide (PRINZIDE,ZESTORETIC) 20-25 MG tablet Take 1 tablet by mouth daily. 06/20/16  Yes Maren Reamer, MD  metFORMIN (GLUCOPHAGE XR) 500 MG 24 hr tablet Take 1 tablet (500 mg total) by mouth daily with breakfast. 06/20/16  Yes Maren Reamer, MD  pravastatin  (PRAVACHOL) 20 MG tablet Take 1 tablet (20 mg total) by mouth daily. 04/12/16  Yes Maren Reamer, MD  naproxen sodium (ANAPROX) 220 MG tablet Take 220 mg by mouth 2 (two) times daily with a meal. Reported on 04/11/2016    Historical Provider, MD  tamsulosin (FLOMAX) 0.4 MG CAPS capsule Take 1 capsule (0.4 mg total) by mouth daily. 06/20/16   Maren Reamer, MD     Objective:   Vitals:   06/20/16 0900  BP: (!) 139/91  Pulse: 76  Resp: 16  Temp: 98.6 F (37 C)  TempSrc: Oral  SpO2: 96%  Weight: 208 lb (94.3 kg)    Exam General appearance : Awake, alert, not in any distress. Speech Clear. Not toxic looking, pleasant, obese, HEENT: Atraumatic and Normocephalic, pupils equally reactive to light. Neck: supple, no JVD. No cervical lymphadenopathy.  Chest:Good air entry bilaterally, no added sounds. CVS: S1 S2 regular, no murmurs/gallups or rubs. Abdomen: Bowel sounds active, obese, Non tender and not distended with no gaurding, rigidity or rebound. Extremities: B/L Lower Ext shows no edema, both legs are warm to touch Neurology: Awake alert, and oriented X 3, CN II-XII grossly intact, Non focal Skin:No Rash  Data Review Lab Results  Component Value Date   HGBA1C 6.6 (H) 04/11/2016    Depression screen [facility] 2/9 06/20/2016 04/11/2016 07/10/2014  Decreased Interest 0  0 -  Down, Depressed, Hopeless 0 2 (No Data)  PHQ - 2 Score 0 2 -  Altered sleeping - 0 -  Tired, decreased energy - 0 -  Change in appetite - 0 -  Feeling bad or failure about yourself  - 1 -  Trouble concentrating - 1 -  Moving slowly or fidgety/restless - 0 -  Suicidal thoughts - 0 -  PHQ-9 Score - 4 -  Difficult doing work/chores - Not difficult at all -      Assessment & Plan   1. Diabetes mellitus type 2 in obese ([place]) Ran out of meds x 1 month, instructed pt to call pharmacy for refills when he has about 1 wk supply left. - Glucose (CBG) 166 - Microalbumin/Creatinine Ratio, Urine - dicussed low carb  diet and increase exercise with patient - renewed metformin   2. Thyroid adenoma Fu w/ Endo, Dr. Loanne Drilling, complete lobectomy recd, pt declined.  Radioactive iodine trx set up as well, pt also declined. - again urged pt to be completely treated to avoid risk.  3. HTN (hypertension), benign Out of meds x 1 month, low salt diet discussed Renewed prinzide  4. BPH (benign prostatic hypertrophy) psa 1.86 (04/11/16) - follow Start flomax 0.4mg  qd trial  5. Need for hepatitis C screening test - Hepatitis C antibody  6. Flu vaccine need - Flu Vaccine QUAD 36+ mos PF IM (Fluarix & Fluzone Quad PF) - pt w/ hx of egg allergy, but no problems with injections in clinic today.  7. Encounter for screening for HIV - HIV antibody (with reflex)  8. Colon cancer screening - Ambulatory referral to Gastroenterology  9. Pneumococcal 23v and tdap today.    Patient have been counseled extensively about nutrition and exercise  Return in about 3 months (around 09/19/2016).  The patient was given clear instructions to go to ER or return to medical center if symptoms don't improve, worsen or new problems develop. The patient verbalized understanding. The patient was told to call to get lab results if they haven't heard anything in the next week.   This note has been created with Surveyor, quantity. Any transcriptional errors are unintentional.   Maren Reamer, MD, [place] [facility] [place], [place]   06/20/2016, 9:24 AM

## 2016-06-21 LAB — HEPATITIS C ANTIBODY: HCV AB: NEGATIVE

## 2016-06-21 LAB — MICROALBUMIN / CREATININE URINE RATIO
CREATININE, URINE: 32 mg/dL (ref 20–370)
MICROALB UR: 0.3 mg/dL
MICROALB/CREAT RATIO: 9 ug/mg{creat} (ref <30)

## 2016-06-21 LAB — HIV ANTIBODY (ROUTINE TESTING W REFLEX): HIV: NONREACTIVE

## 2016-06-29 ENCOUNTER — Telehealth: Payer: Self-pay

## 2016-06-29 NOTE — Telephone Encounter (Signed)
Contacted pt to go over lab results pt is aware and doesn't have any questions or concerns 

## 2016-09-29 MED FILL — METFORMIN HCL ER 500 MG TAB: 500 | 90 days supply | Qty: 90 | Fill #1

## 2016-12-23 ENCOUNTER — Encounter (HOSPITAL_COMMUNITY): Payer: Self-pay

## 2016-12-23 ENCOUNTER — Ambulatory Visit (HOSPITAL_COMMUNITY)
Admission: EM | Admit: 2016-12-23 | Discharge: 2016-12-23 | Disposition: A | Discharge status: Home / Self Care | Payer: BLUE CROSS/BLUE SHIELD | Attending: Family Medicine | Admitting: Family Medicine

## 2016-12-23 DIAGNOSIS — Z7982 Long term (current) use of aspirin: Secondary | ICD-10-CM | POA: Insufficient documentation

## 2016-12-23 DIAGNOSIS — N139 Obstructive and reflux uropathy, unspecified: Secondary | ICD-10-CM

## 2016-12-23 DIAGNOSIS — R1084 Generalized abdominal pain: Secondary | ICD-10-CM | POA: Insufficient documentation

## 2016-12-23 DIAGNOSIS — Z79899 Other long term (current) drug therapy: Secondary | ICD-10-CM | POA: Insufficient documentation

## 2016-12-23 DIAGNOSIS — K2100 Gastro-esophageal reflux disease with esophagitis, without bleeding: Secondary | ICD-10-CM

## 2016-12-23 DIAGNOSIS — Z7984 Long term (current) use of oral hypoglycemic drugs: Secondary | ICD-10-CM | POA: Insufficient documentation

## 2016-12-23 DIAGNOSIS — K5900 Constipation, unspecified: Secondary | ICD-10-CM | POA: Diagnosis not present

## 2016-12-23 DIAGNOSIS — R109 Unspecified abdominal pain: Secondary | ICD-10-CM | POA: Diagnosis present

## 2016-12-23 DIAGNOSIS — K5909 Other constipation: Secondary | ICD-10-CM

## 2016-12-23 DIAGNOSIS — F17210 Nicotine dependence, cigarettes, uncomplicated: Secondary | ICD-10-CM | POA: Insufficient documentation

## 2016-12-23 LAB — POCT URINALYSIS DIP (DEVICE)
Glucose, UA: NEGATIVE mg/dL
NITRITE: NEGATIVE
PH: 5.5 (ref 5.0–8.0)
Protein, ur: 30 mg/dL — AB
SPECIFIC GRAVITY, URINE: 1.015 (ref 1.005–1.030)
Urobilinogen, UA: 0.2 mg/dL (ref 0.0–1.0)

## 2016-12-23 MED ORDER — CIPROFLOXACIN HCL 500 MG PO TABS: 500.0000 mg | ORAL_TABLET | Freq: Two times a day (BID) | ORAL | 0 refills | Status: DC

## 2016-12-23 MED ORDER — GI COCKTAIL ~~LOC~~
30.0000 mL | Freq: Once | ORAL | Status: AC
  Administered 2016-12-23: 30 mL via ORAL

## 2016-12-23 MED ORDER — MAGNESIUM CITRATE PO SOLN: ORAL | 1 refills | Status: DC

## 2016-12-23 MED ORDER — GI COCKTAIL ~~LOC~~
ORAL | Status: AC
  Filled 2016-12-23: qty 30

## 2016-12-23 MED ORDER — OMEPRAZOLE 20 MG PO CPDR: 20.0000 mg | DELAYED_RELEASE_CAPSULE | Freq: Every day | ORAL | 0 refills | Status: DC

## 2016-12-23 MED FILL — CIPROFLOXACIN HCL 500 MG TA: 500 | 14 days supply | Qty: 28 | Fill #0

## 2016-12-23 NOTE — ED Provider Notes (Signed)
CSN: [id number]     Arrival date & time 12/23/16  1109 History   First MD Initiated Contact with Patient 12/23/16 1152     Chief Complaint  Patient presents with  . Abdominal Pain   (Consider location/radiation/quality/duration/timing/severity/associated sxs/prior Treatment) Patient comes in with multiple complaints.  He c/o inability to void.  He states recently he has been experiencing some urinary frequency and nocturia.  He states he has been unable to void since 8 pm.  He c/o abdominal pain.  He c/o GERD sx's.  He c/o constipation.   The history is provided by the patient.  Abdominal Pain  Pain location:  Generalized Pain quality: aching   Pain radiates to:  Groin and periumbilical region Pain severity:  Moderate Onset quality:  Gradual Duration:  16 hours Timing:  Constant Progression:  Worsening Chronicity:  New Relieved by:  Nothing Worsened by:  Nothing Ineffective treatments:  None tried Associated symptoms: constipation     Past Medical History:  Diagnosis Date  . Diarrhea   . Thyroid mass    Past Surgical History:  Procedure Laterality Date  . ESOPHAGOGASTRODUODENOSCOPY N/A 06/27/2014   Procedure: ESOPHAGOGASTRODUODENOSCOPY (EGD);  Surgeon: Beryle Beams, MD;  Location: [facility];  Service: Endoscopy;  Laterality: N/A;  . HERNIA REPAIR     x2  . NASAL FRACTURE SURGERY    . THYROIDECTOMY Left 08/19/2014   Procedure: LEFT THYROID LOBECTOMY;  Surgeon: Rozetta Nunnery, MD;  Location: [facility];  Service: ENT;  Laterality: Left;   Family History  Problem Relation Age of Onset  . Thyroid disease Neg Hx    Social History  Substance Use Topics  . Smoking status: Current Every Day Smoker    Packs/day: 1.00    Types: Cigarettes  . Smokeless tobacco: Never Used  . Alcohol use Yes    Review of Systems  Constitutional: Negative.   HENT: Negative.   Eyes: Negative.   Respiratory: Negative.   Cardiovascular: Negative.   Gastrointestinal: Positive for  abdominal pain and constipation.  Endocrine: Negative.   Genitourinary: Positive for difficulty urinating and urgency.  Musculoskeletal: Negative.   Skin: Negative.   Allergic/Immunologic: Negative.   Neurological: Negative.   Hematological: Negative.   Psychiatric/Behavioral: Negative.     Allergies  Acyclovir and related  Home Medications   Prior to Admission medications   Medication Sig Start Date End Date Taking? Authorizing Provider  lisinopril-hydrochlorothiazide (PRINZIDE,ZESTORETIC) 20-25 MG tablet Take 1 tablet by mouth daily. 06/20/16  Yes Maren Reamer, MD  metFORMIN (GLUCOPHAGE XR) 500 MG 24 hr tablet Take 1 tablet (500 mg total) by mouth daily with breakfast. 06/20/16  Yes Maren Reamer, MD  aspirin 325 MG tablet Take 325 mg by mouth daily.    Historical Provider, MD  buPROPion (WELLBUTRIN SR) 150 MG 12 hr tablet Take 1 tablet (150 mg total) by mouth 2 (two) times daily. 04/11/16   Maren Reamer, MD  ciprofloxacin (CIPRO) 500 MG tablet Take 1 tablet (500 mg total) by mouth 2 (two) times daily. 12/23/16   Lysbeth Penner, FNP  magnesium citrate SOLN Drink one bottle and can repeat in 4 hours as necessary 12/23/16   Lysbeth Penner, FNP  naproxen sodium (ANAPROX) 220 MG tablet Take 220 mg by mouth 2 (two) times daily with a meal. Reported on 04/11/2016    Historical Provider, MD  omeprazole (PRILOSEC) 20 MG capsule Take 1 capsule (20 mg total) by mouth daily. 12/23/16   Lysbeth Penner, FNP  pravastatin (PRAVACHOL) 20 MG tablet Take 1 tablet (20 mg total) by mouth daily. 04/12/16   Maren Reamer, MD  tamsulosin (FLOMAX) 0.4 MG CAPS capsule Take 1 capsule (0.4 mg total) by mouth daily. 06/20/16   Maren Reamer, MD   Meds Ordered and Administered this Visit   Medications  gi cocktail (Maalox,Lidocaine,Donnatal) (30 mLs Oral Given 12/23/16 1241)    BP 109/77 (BP Location: Left Arm)   Pulse 79   Temp 97.4 F (36.3 C) (Oral)   Resp 20   SpO2 98%  No data  found.   Physical Exam  Constitutional: He is oriented to person, place, and time. He appears well-developed and well-nourished.  HENT:  Head: Normocephalic and atraumatic.  Eyes: Conjunctivae and EOM are normal. Pupils are equal, round, and reactive to light.  Neck: Normal range of motion. Neck supple.  Cardiovascular: Normal rate, regular rhythm and normal heart sounds.   Pulmonary/Chest: Effort normal and breath sounds normal.  Abdominal: He exhibits distension. There is tenderness.  Neurological: He is alert and oriented to person, place, and time.  Nursing note and vitals reviewed.   Urgent Care Course     Procedures (including critical care time)  Labs Review Labs Reviewed  POCT URINALYSIS DIP (DEVICE) - Abnormal; Notable for the following:       Result Value   Bilirubin Urine SMALL (*)    Ketones, ur TRACE (*)    Hgb urine dipstick TRACE (*)    Protein, ur 30 (*)    Leukocytes, UA TRACE (*)    All other components within normal limits  URINE CULTURE    Imaging Review No results found.   Visual Acuity Review  Right Eye Distance:   Left Eye Distance:   Bilateral Distance:    Right Eye Near:   Left Eye Near:    Bilateral Near:         MDM   1. Uropathy, obstructive   2. Other constipation   3. Gastroesophageal reflux disease with esophagitis    Foley Catheter inserted and 400 cc's of urine is drained and patient expresses relief.  Cipro 500mg  one po bid x 2 weeks Mag Citrate 1 bottle and may repeat in 4 hours if no results  GI cocktail with good results Prilosec 20mg  one po qd #14     Lysbeth Penner, FNP 12/23/16 1308

## 2016-12-23 NOTE — ED Triage Notes (Signed)
Pt having abdominal pain and unable to urinate. Haven't went to the bathroom since 8pm last night also complains of acid reflux. Along with dizziness.

## 2016-12-23 NOTE — Discharge Instructions (Signed)
Please keep the catheter in until seen by Urology office.

## 2016-12-25 LAB — URINE CULTURE: Culture: <10000 — AB

## 2016-12-27 MED FILL — TAMSULOSIN HCL 0.4 MG CAP: 0.4 | 30 days supply | Qty: 30 | Fill #0

## 2017-01-04 MED FILL — METFORMIN HCL ER 500 MG TAB: 500 | 90 days supply | Qty: 90 | Fill #2

## 2017-04-06 MED FILL — METFORMIN HCL ER 500 MG TAB: 500 | 90 days supply | Qty: 90 | Fill #3

## 2017-07-11 ENCOUNTER — Ambulatory Visit: Payer: BLUE CROSS/BLUE SHIELD | Attending: Family Medicine | Admitting: Family Medicine

## 2017-07-11 ENCOUNTER — Encounter: Payer: Self-pay | Admitting: Family Medicine

## 2017-07-11 VITALS — BP 152/88 | HR 68 | Temp 98.3°F | Ht 68.0 in | Wt 213.8 lb

## 2017-07-11 DIAGNOSIS — Z1211 Encounter for screening for malignant neoplasm of colon: Secondary | ICD-10-CM

## 2017-07-11 DIAGNOSIS — E119 Type 2 diabetes mellitus without complications: Secondary | ICD-10-CM | POA: Diagnosis present

## 2017-07-11 DIAGNOSIS — Z7984 Long term (current) use of oral hypoglycemic drugs: Secondary | ICD-10-CM | POA: Insufficient documentation

## 2017-07-11 DIAGNOSIS — Z6832 Body mass index (BMI) 32.0-32.9, adult: Secondary | ICD-10-CM | POA: Insufficient documentation

## 2017-07-11 DIAGNOSIS — E1169 Type 2 diabetes mellitus with other specified complication: Secondary | ICD-10-CM

## 2017-07-11 DIAGNOSIS — Z7982 Long term (current) use of aspirin: Secondary | ICD-10-CM | POA: Diagnosis not present

## 2017-07-11 DIAGNOSIS — C73 Malignant neoplasm of thyroid gland: Secondary | ICD-10-CM | POA: Insufficient documentation

## 2017-07-11 DIAGNOSIS — I10 Essential (primary) hypertension: Secondary | ICD-10-CM | POA: Insufficient documentation

## 2017-07-11 DIAGNOSIS — Z79899 Other long term (current) drug therapy: Secondary | ICD-10-CM | POA: Diagnosis not present

## 2017-07-11 DIAGNOSIS — E669 Obesity, unspecified: Secondary | ICD-10-CM | POA: Diagnosis not present

## 2017-07-11 DIAGNOSIS — Z9889 Other specified postprocedural states: Secondary | ICD-10-CM | POA: Diagnosis not present

## 2017-07-11 LAB — POCT GLYCOSYLATED HEMOGLOBIN (HGB A1C): HEMOGLOBIN A1C: 6.6

## 2017-07-11 LAB — GLUCOSE, POCT (MANUAL RESULT ENTRY): POC Glucose: 83 mg/dl (ref 70–99)

## 2017-07-11 MED ORDER — METFORMIN HCL ER 500 MG PO TB24: 500.0000 mg | ORAL_TABLET | Freq: Every day | ORAL | 1 refills | Status: DC

## 2017-07-11 MED ORDER — LISINOPRIL-HYDROCHLOROTHIAZIDE 20-25 MG PO TABS: 1.0000 | ORAL_TABLET | Freq: Every day | ORAL | 1 refills | Status: DC

## 2017-07-11 MED FILL — LISINOPRIL-HCTZ 20-25 MG TA: 20-25 | 90 days supply | Qty: 90 | Fill #0

## 2017-07-11 MED FILL — METFORMIN HCL ER 500 MG TAB: 500 | 90 days supply | Qty: 90 | Fill #0

## 2017-07-11 NOTE — Progress Notes (Signed)
Subjective:  Patient ID: Dalton Arnold, male    DOB: 1965/10/11  Age: 51 y.o. 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 He was previously followed by Dr.Langeland.  He has been compliant with his metformin but has not been taking his antihypertensive and sits elevated blood pressure. He denies hypoglycemia, numbness in extremities, visual complaints and has not had an eye exam in the last 2-3 years.  With regards to his thyroid adenocarcinoma he was last seen by endocrine in 04/2016 and a complete lobectomy recommended which he declined and he also never made it to his radioactive iodine therapy which was scheduled.  Today he has no concerns and is up-to-date on his flu shot.  Past Medical History:  Diagnosis Date  . Diarrhea   . Thyroid mass     Past Surgical History:  Procedure Laterality Date  . ESOPHAGOGASTRODUODENOSCOPY N/A 06/27/2014   Procedure: ESOPHAGOGASTRODUODENOSCOPY (EGD);  Surgeon: Beryle Beams, MD;  Location: [facility];  Service: Endoscopy;  Laterality: N/A;  . HERNIA REPAIR     x2  . NASAL FRACTURE SURGERY    . THYROIDECTOMY Left 08/19/2014   Procedure: LEFT THYROID LOBECTOMY;  Surgeon: Rozetta Nunnery, MD;  Location: [facility];  Service: ENT;  Laterality: Left;    Allergies  Allergen Reactions  . Acyclovir And Related      Outpatient Medications Prior to Visit  Medication Sig Dispense Refill  . metFORMIN (GLUCOPHAGE XR) 500 MG 24 hr tablet Take 1 tablet (500 mg total) by mouth daily with breakfast. 90 tablet 3  . aspirin 325 MG tablet Take 325 mg by mouth daily.    . magnesium citrate SOLN Drink one bottle and can repeat in 4 hours as necessary (Patient not taking: Reported on 07/11/2017) 2 Bottle 1  . naproxen  sodium (ANAPROX) 220 MG tablet Take 220 mg by mouth 2 (two) times daily with a meal. Reported on 04/11/2016    . pravastatin (PRAVACHOL) 20 MG tablet Take 1 tablet (20 mg total) by mouth daily. (Patient not taking: Reported on 07/11/2017) 90 tablet 3  . buPROPion (WELLBUTRIN SR) 150 MG 12 hr tablet Take 1 tablet (150 mg total) by mouth 2 (two) times daily. (Patient not taking: Reported on 07/11/2017) 60 tablet 3  . ciprofloxacin (CIPRO) 500 MG tablet Take 1 tablet (500 mg total) by mouth 2 (two) times daily. (Patient not taking: Reported on 07/11/2017) 28 tablet 0  . lisinopril-hydrochlorothiazide (PRINZIDE,ZESTORETIC) 20-25 MG tablet Take 1 tablet by mouth daily. (Patient not taking: Reported on 07/11/2017) 90 tablet 3  . omeprazole (PRILOSEC) 20 MG capsule Take 1 capsule (20 mg total) by mouth daily. (Patient not taking: Reported on 07/11/2017) 14 capsule 0  . tamsulosin (FLOMAX) 0.4 MG CAPS capsule Take 1 capsule (0.4 mg total) by mouth daily. (Patient not taking: Reported on 07/11/2017) 30 capsule 3   No facility-administered medications prior to visit.     ROS Review of Systems  Constitutional: Negative for activity change and appetite change.  HENT: Negative for sinus pressure and sore throat.   Eyes: Negative for visual disturbance.  Respiratory: Negative for cough, chest tightness and shortness of breath.   Cardiovascular: Negative for chest pain and leg swelling.  Gastrointestinal: Negative for abdominal distention, abdominal pain, constipation and diarrhea.  Endocrine: Negative.   Genitourinary: Negative for dysuria.  Musculoskeletal: Negative for joint swelling and myalgias.  Skin: Negative for rash.  Allergic/Immunologic: Negative.   Neurological: Negative for weakness, light-headedness and numbness.  Psychiatric/Behavioral: Negative for dysphoric mood and suicidal ideas.     Objective:  BP (!) 152/88   Pulse 68   Temp 98.3 F (36.8 C) (Oral)   Ht 5' 8"  (1.727 m)   Wt 213 lb  12.8 oz (97 kg)   SpO2 98%   BMI 32.51 kg/m   BP/Weight 07/11/2017 12/23/2016 3/89/3734  Systolic BP 287 681 157  Diastolic BP 88 77 91  Wt. (Lbs) 213.8 - 208  BMI 32.51 - 31.63      Physical Exam  Constitutional: He is oriented to person, place, and time. He appears well-developed and well-nourished.  Cardiovascular: Normal rate, normal heart sounds and intact distal pulses.   No murmur heard. Pulmonary/Chest: Effort normal and breath sounds normal. He has no wheezes. He has no rales. He exhibits no tenderness.  Abdominal: Soft. Bowel sounds are normal. He exhibits no distension and no mass. There is no tenderness.  Musculoskeletal: Normal range of motion.  Neurological: He is alert and oriented to person, place, and time.  Skin: Skin is warm and dry.  Psychiatric: He has a normal mood and affect.     CMP Latest Ref Rng & Units 04/11/2016 08/14/2014 06/28/2014  Glucose 65 - 99 mg/dL 105(H) 82 95  BUN 7 - 25 mg/dL 14 11 11   Creatinine 0.70 - 1.33 mg/dL 0.84 0.71 0.96  Sodium 135 - 146 mmol/L 136 138 140  Potassium 3.5 - 5.3 mmol/L 4.4 4.1 4.4  Chloride 98 - 110 mmol/L 104 101 101  CO2 20 - 31 mmol/L 21 23 25   Calcium 8.6 - 10.3 mg/dL 8.9 9.4 9.4  Total Protein 6.0 - 8.3 g/dL - - 7.5  Total Bilirubin 0.3 - 1.2 mg/dL - - 0.6  Alkaline Phos 39 - 117 U/L - - 79  AST 0 - 37 U/L - - 14  ALT 0 - 53 U/L - - 14    Lipid Panel     Component Value Date/Time   CHOL 242 (H) 04/11/2016 1009   TRIG 149 04/11/2016 1009   HDL 39 (L) 04/11/2016 1009   CHOLHDL 6.2 (H) 04/11/2016 1009   VLDL 30 04/11/2016 1009   LDLCALC 173 (H) 04/11/2016 1009    The 10-year ASCVD risk score Mikey Bussing DC Jr., et al., 2013) is: 38.4%   Values used to calculate the score:     Age: 66 years     Sex: Male     Is Non-Hispanic African American: No     Diabetic: Yes     Tobacco smoker: Yes     Systolic Blood Pressure: 262 mmHg     Is BP treated: Yes     HDL Cholesterol: 39 mg/dL     Total Cholesterol:  242 mg/dL  Lab Results  Component Value Date   HGBA1C 6.6 07/11/2017    Assessment & Plan:   1. Diabetes mellitus type 2 in obese ([place]) Controlled with A1c of 6.6 Diabetic diet, lifestyle modifications Prescribed pravastatin by previous PCP which he has not been taking I'll change to moderate intensity statin once lipid panel results is obtained - ASCVD risk of 38% - POCT glucose (manual entry) - POCT glycosylated hemoglobin (Hb A1C) - metFORMIN (GLUCOPHAGE XR) 500 MG 24 hr tablet; Take 1 tablet (500 mg total) by mouth daily with  breakfast.  Dispense: 90 tablet; Refill: 1 - CMP14+EGFR; Future - Lipid panel; Future - Microalbumin/Creatinine Ratio, Urine; Future  2. 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  3. HTN (hypertension), benign Uncontrolled due to not taking antihypertensive which I have refilled Low-sodium diet Advised on weight loss - lisinopril-hydrochlorothiazide (PRINZIDE,ZESTORETIC) 20-25 MG tablet; Take 1 tablet by mouth daily.  Dispense: 90 tablet; Refill: 1  4. Obesity (BMI 30.0-34.9) Discussed reducing portion sizes, increasing physical activity to 150 minutes per week  5. HCM/screening for colon cancer Referred for colonoscopy  Meds ordered this encounter  Medications  . lisinopril-hydrochlorothiazide (PRINZIDE,ZESTORETIC) 20-25 MG tablet    Sig: Take 1 tablet by mouth daily.    Dispense:  90 tablet    Refill:  1  . metFORMIN (GLUCOPHAGE XR) 500 MG 24 hr tablet    Sig: Take 1 tablet (500 mg total) by mouth daily with breakfast.    Dispense:  90 tablet    Refill:  1    Follow-up: Return in about 6 months (around 01/09/2018) for Follow-up on diabetes mellitus.   Arnoldo Morale MD

## 2017-07-11 NOTE — Patient Instructions (Signed)
Diabetes Mellitus and Food It is important for you to manage your blood sugar (glucose) level. Your blood glucose level can be greatly affected by what you eat. Eating healthier foods in the appropriate amounts throughout the day at about the same time each day will help you control your blood glucose level. It can also help slow or prevent worsening of your diabetes mellitus. Healthy eating may even help you improve the level of your blood pressure and reach or maintain a healthy weight. General recommendations for healthful eating and cooking habits include:  Eating meals and snacks regularly. Avoid going long periods of time without eating to lose weight.  Eating a diet that consists mainly of plant-based foods, such as fruits, vegetables, nuts, legumes, and whole grains.  Using low-heat cooking methods, such as baking, instead of high-heat cooking methods, such as deep frying.  Work with your dietitian to make sure you understand how to use the Nutrition Facts information on food labels. How can food affect me? Carbohydrates Carbohydrates affect your blood glucose level more than any other type of food. Your dietitian will help you determine how many carbohydrates to eat at each meal and teach you how to count carbohydrates. Counting carbohydrates is important to keep your blood glucose at a healthy level, especially if you are using insulin or taking certain medicines for diabetes mellitus. Alcohol Alcohol can cause sudden decreases in blood glucose (hypoglycemia), especially if you use insulin or take certain medicines for diabetes mellitus. Hypoglycemia can be a life-threatening condition. Symptoms of hypoglycemia (sleepiness, dizziness, and disorientation) are similar to symptoms of having too much alcohol. If your health care provider has given you approval to drink alcohol, do so in moderation and use the following guidelines:  Women should not have more than one drink per day, and men  should not have more than two drinks per day. One drink is equal to: ? 12 oz of beer. ? 5 oz of wine. ? 1 oz of hard liquor.  Do not drink on an empty stomach.  Keep yourself hydrated. Have water, diet soda, or unsweetened iced tea.  Regular soda, juice, and other mixers might contain a lot of carbohydrates and should be counted.  What foods are not recommended? As you make food choices, it is important to remember that all foods are not the same. Some foods have fewer nutrients per serving than other foods, even though they might have the same number of calories or carbohydrates. It is difficult to get your body what it needs when you eat foods with fewer nutrients. Examples of foods that you should avoid that are high in calories and carbohydrates but low in nutrients include:  Trans fats (most processed foods list trans fats on the Nutrition Facts label).  Regular soda.  Juice.  Candy.  Sweets, such as cake, pie, doughnuts, and cookies.  Fried foods.  What foods can I eat? Eat nutrient-rich foods, which will nourish your body and keep you healthy. The food you should eat also will depend on several factors, including:  The calories you need.  The medicines you take.  Your weight.  Your blood glucose level.  Your blood pressure level.  Your cholesterol level.  You should eat a variety of foods, including:  Protein. ? Lean cuts of meat. ? Proteins low in saturated fats, such as fish, egg whites, and beans. Avoid processed meats.  Fruits and vegetables. ? Fruits and vegetables that may help control blood glucose levels, such as apples,   mangoes, and yams.  Dairy products. ? Choose fat-free or low-fat dairy products, such as milk, yogurt, and cheese.  Grains, bread, pasta, and rice. ? Choose whole grain products, such as multigrain bread, whole oats, and brown rice. These foods may help control blood pressure.  Fats. ? Foods containing healthful fats, such as  nuts, avocado, olive oil, canola oil, and fish.  Does everyone with diabetes mellitus have the same meal plan? Because every person with diabetes mellitus is different, there is not one meal plan that works for everyone. It is very important that you meet with a dietitian who will help you create a meal plan that is just right for you. This information is not intended to replace advice given to you by your health care provider. Make sure you discuss any questions you have with your health care provider. Document Released: 06/16/2005 Document Revised: 02/25/2016 Document Reviewed: 08/16/2013 Elsevier Interactive Patient Education  2017 [employer] Inc.  

## 2017-07-12 ENCOUNTER — Ambulatory Visit: Payer: BLUE CROSS/BLUE SHIELD | Attending: Family Medicine

## 2017-07-12 DIAGNOSIS — E119 Type 2 diabetes mellitus without complications: Secondary | ICD-10-CM | POA: Insufficient documentation

## 2017-07-12 DIAGNOSIS — Z6832 Body mass index (BMI) 32.0-32.9, adult: Secondary | ICD-10-CM | POA: Insufficient documentation

## 2017-07-12 DIAGNOSIS — E669 Obesity, unspecified: Secondary | ICD-10-CM | POA: Insufficient documentation

## 2017-07-12 DIAGNOSIS — E1169 Type 2 diabetes mellitus with other specified complication: Secondary | ICD-10-CM

## 2017-07-12 NOTE — Progress Notes (Signed)
Patient here for lab visit  

## 2017-07-13 LAB — CMP14+EGFR
A/G RATIO: 1.6 (ref 1.2–2.2)
ALBUMIN: 4.2 g/dL (ref 3.5–5.5)
ALT: 39 IU/L (ref 0–44)
AST: 27 IU/L (ref 0–40)
Alkaline Phosphatase: 87 IU/L (ref 39–117)
BUN / CREAT RATIO: 10 (ref 9–20)
BUN: 9 mg/dL (ref 6–24)
CHLORIDE: 103 mmol/L (ref 96–106)
CO2: 21 mmol/L (ref 20–29)
Calcium: 8.7 mg/dL (ref 8.7–10.2)
Creatinine, Ser: 0.87 mg/dL (ref 0.76–1.27)
GFR calc non Af Amer: 99 mL/min/{1.73_m2} (ref >59)
GFR, EST AFRICAN AMERICAN: 114 mL/min/{1.73_m2} (ref >59)
GLOBULIN, TOTAL: 2.6 g/dL (ref 1.5–4.5)
Glucose: 102 mg/dL — ABNORMAL HIGH (ref 65–99)
POTASSIUM: 4.7 mmol/L (ref 3.5–5.2)
Sodium: 139 mmol/L (ref 134–144)
TOTAL PROTEIN: 6.8 g/dL (ref 6.0–8.5)

## 2017-07-13 LAB — LIPID PANEL
Chol/HDL Ratio: 5.9 ratio — ABNORMAL HIGH (ref 0.0–5.0)
Cholesterol, Total: 234 mg/dL — ABNORMAL HIGH (ref 100–199)
HDL: 40 mg/dL (ref >39)
LDL Calculated: 167 mg/dL — ABNORMAL HIGH (ref 0–99)
Triglycerides: 134 mg/dL (ref 0–149)
VLDL Cholesterol Cal: 27 mg/dL (ref 5–40)

## 2017-07-13 LAB — MICROALBUMIN / CREATININE URINE RATIO
CREATININE, UR: 95.4 mg/dL
MICROALBUM., U, RANDOM: 17.6 ug/mL
Microalb/Creat Ratio: 18.4 mg/g creat (ref 0.0–30.0)

## 2017-07-14 ENCOUNTER — Other Ambulatory Visit: Payer: Self-pay | Admitting: Family Medicine

## 2017-07-14 MED ORDER — ATORVASTATIN CALCIUM 40 MG PO TABS: 40.0000 mg | ORAL_TABLET | Freq: Every day | ORAL | 5 refills | Status: DC

## 2017-07-21 ENCOUNTER — Telehealth: Payer: Self-pay

## 2017-07-21 NOTE — Telephone Encounter (Signed)
Pt was called and informed of lab results. 

## 2017-09-05 ENCOUNTER — Encounter: Payer: Self-pay | Admitting: Gastroenterology

## 2017-10-03 HISTORY — PX: COLONOSCOPY: SHX174

## 2017-10-05 MED FILL — METFORMIN HCL ER 500 MG TAB: 500 | 90 days supply | Qty: 90 | Fill #1

## 2017-10-05 MED FILL — LISINOPRIL-HCTZ 20-25 MG TA: 20-25 | 90 days supply | Qty: 90 | Fill #1

## 2017-10-18 ENCOUNTER — Telehealth: Payer: Self-pay | Admitting: Gastroenterology

## 2017-10-18 ENCOUNTER — Other Ambulatory Visit: Payer: Self-pay

## 2017-10-18 ENCOUNTER — Ambulatory Visit (AMBULATORY_SURGERY_CENTER): Payer: Self-pay | Admitting: *Deleted

## 2017-10-18 VITALS — Ht 67.0 in | Wt 214.0 lb

## 2017-10-18 DIAGNOSIS — Z1211 Encounter for screening for malignant neoplasm of colon: Secondary | ICD-10-CM

## 2017-10-18 MED ORDER — NA SULFATE-K SULFATE-MG SULF 17.5-3.13-1.6 GM/177ML PO SOLN: ORAL | 0 refills | Status: DC

## 2017-10-18 MED FILL — SUPREP BOWEL PREP KIT: 17.5-3.13-1 | 1 days supply | Qty: 354 | Fill #0

## 2017-10-18 NOTE — Progress Notes (Signed)
Patient denies any allergies to eggs or soy. Patient denies any problems with anesthesia/sedation. Patient denies any oxygen use at home. Patient denies taking any diet/weight loss medications or blood thinners. No email

## 2017-10-18 NOTE — Telephone Encounter (Signed)
Spoke with pharmacy, Suprep not covered nor is Plenvu. Suprep $140. Notified pt and offered to change it to OTC Miralax cost about $20. Pt states he can afford Suprep at $140 and does not have time to get new instructions. Suprep coupon [employer] to [employer].

## 2017-10-19 ENCOUNTER — Encounter: Payer: Self-pay | Admitting: Gastroenterology

## 2017-11-01 ENCOUNTER — Ambulatory Visit (AMBULATORY_SURGERY_CENTER): Payer: BLUE CROSS/BLUE SHIELD | Admitting: Gastroenterology

## 2017-11-01 ENCOUNTER — Encounter: Payer: Self-pay | Admitting: Gastroenterology

## 2017-11-01 ENCOUNTER — Other Ambulatory Visit: Payer: Self-pay

## 2017-11-01 VITALS — BP 128/102 | HR 75 | Temp 97.3°F | Resp 14 | Ht 67.0 in | Wt 214.0 lb

## 2017-11-01 DIAGNOSIS — D123 Benign neoplasm of transverse colon: Secondary | ICD-10-CM

## 2017-11-01 DIAGNOSIS — D124 Benign neoplasm of descending colon: Secondary | ICD-10-CM | POA: Diagnosis not present

## 2017-11-01 DIAGNOSIS — Z1212 Encounter for screening for malignant neoplasm of rectum: Secondary | ICD-10-CM | POA: Diagnosis not present

## 2017-11-01 DIAGNOSIS — Z1211 Encounter for screening for malignant neoplasm of colon: Secondary | ICD-10-CM | POA: Diagnosis present

## 2017-11-01 DIAGNOSIS — D120 Benign neoplasm of cecum: Secondary | ICD-10-CM

## 2017-11-01 DIAGNOSIS — D122 Benign neoplasm of ascending colon: Secondary | ICD-10-CM

## 2017-11-01 MED ORDER — SODIUM CHLORIDE 0.9 % IV SOLN: 500.0000 mL | Freq: Once | INTRAVENOUS | Status: DC

## 2017-11-01 NOTE — Progress Notes (Signed)
Called to room to assist during endoscopic procedure.  Patient ID and intended procedure confirmed with present staff. Received instructions for my participation in the procedure from the performing physician.  

## 2017-11-01 NOTE — Progress Notes (Signed)
A and O x3. Report to RN. Tolerated MAC anesthesia well.

## 2017-11-01 NOTE — Patient Instructions (Signed)
   Information on polyps and diverticulosis given to you today  Await pathology results on polyps removed (in a letter from Dr Loletha Carrow)   YOU HAD AN ENDOSCOPIC PROCEDURE TODAY AT THE [facility]:   Refer to the procedure report that was given to you for any specific questions about what was found during the examination.  If the procedure report does not answer your questions, please call your gastroenterologist to clarify.  If you requested that your care partner not be given the details of your procedure findings, then the procedure report has been included in a sealed envelope for you to review at your convenience later.  YOU SHOULD EXPECT: Some feelings of bloating in the abdomen. Passage of more gas than usual.  Walking can help get rid of the air that was put into your GI tract during the procedure and reduce the bloating. If you had a lower endoscopy (such as a colonoscopy or flexible sigmoidoscopy) you may notice spotting of blood in your stool or on the toilet paper. If you underwent a bowel prep for your procedure, you may not have a normal bowel movement for a few days.  Please Note:  You might notice some irritation and congestion in your nose or some drainage.  This is from the oxygen used during your procedure.  There is no need for concern and it should clear up in a day or so.  SYMPTOMS TO REPORT IMMEDIATELY:   Following lower endoscopy (colonoscopy or flexible sigmoidoscopy):  Excessive amounts of blood in the stool  Significant tenderness or worsening of abdominal pains  Swelling of the abdomen that is new, acute  Fever of 100F or higher    For urgent or emergent issues, a gastroenterologist can be reached at any hour by calling [phone].   DIET:  We do recommend a small meal at first, but then you may proceed to your regular diet.  Drink plenty of fluids but you should avoid alcoholic beverages for 24 hours.  ACTIVITY:  You should plan to take it easy  for the rest of today and you should NOT DRIVE or use heavy machinery until tomorrow (because of the sedation medicines used during the test).    FOLLOW UP: Our staff will call the number listed on your records the next business day following your procedure to check on you and address any questions or concerns that you may have regarding the information given to you following your procedure. If we do not reach you, we will leave a message.  However, if you are feeling well and you are not experiencing any problems, there is no need to return our call.  We will assume that you have returned to your regular daily activities without incident.  If any biopsies were taken you will be contacted by phone or by letter within the next 1-3 weeks.  Please call us at [phone] if you have not heard about the biopsies in 3 weeks.    SIGNATURES/CONFIDENTIALITY: You and/or your care partner have signed paperwork which will be entered into your electronic medical record.  These signatures attest to the fact that that the information above on your After Visit Summary has been reviewed and is understood.  Full responsibility of the confidentiality of this discharge information lies with you and/or your care-partner.

## 2017-11-01 NOTE — Progress Notes (Signed)
Pt's states no medical or surgical changes since previsit or office visit.  Pt states 2 years ago he developed a rash after eating eggs, but has no problems now.  States he had the flu shot this year with no problem.  CRNA Tera Partridge notified and states "ok" to proceed with procedure.

## 2017-11-01 NOTE — Op Note (Signed)
[facility] Patient Name: Dalton Arnold Procedure Date: 11/01/2017 8:26 AM MRN: [id number] Endoscopist: Mallie Mussel L. Loletha Carrow , MD Age: 52 Referring MD:  Date of Birth: 1966-09-04 Gender: Male Account #: [id number] Procedure:                Colonoscopy Indications:              Screening for colorectal malignant neoplasm, This                            is the patient's first colonoscopy Medicines:                Monitored Anesthesia Care Procedure:                Pre-Anesthesia Assessment:                           - Prior to the procedure, a History and Physical                            was performed, and patient medications and                            allergies were reviewed. The patient's tolerance of                            previous anesthesia was also reviewed. The risks                            and benefits of the procedure and the sedation                            options and risks were discussed with the patient.                            All questions were answered, and informed consent                            was obtained. Prior Anticoagulants: The patient has                            taken no previous anticoagulant or antiplatelet                            agents. ASA Grade Assessment: III - A patient with                            severe systemic disease. After reviewing the risks                            and benefits, the patient was deemed in                            satisfactory condition to undergo the procedure.  After obtaining informed consent, the colonoscope                            was passed under direct vision. Throughout the                            procedure, the patient's blood pressure, pulse, and                            oxygen saturations were monitored continuously. The                            Colonoscope was introduced through the anus and                            advanced to the the  cecum, identified by                            appendiceal orifice and ileocecal valve. The                            colonoscopy was performed without difficulty. The                            patient tolerated the procedure well. The quality                            of the bowel preparation was good. The ileocecal                            valve, appendiceal orifice, and rectum were                            photographed. The quality of the bowel preparation                            was evaluated using the BBPS [facility] Bowel                            Preparation Scale) with scores of: Right Colon = 2,                            Transverse Colon = 2 and Left Colon = 2. The total                            BBPS score equals 6. The bowel preparation used was                            SUPREP. Scope In: 8:31:59 AM Scope Out: 9:08:13 AM Scope Withdrawal Time: 0 hours 32 minutes 39 seconds  Total Procedure Duration: 0 hours 36 minutes 14 seconds  Findings:                 The perianal and digital rectal  examinations were                            normal.                           A 2 mm polyp was found in the cecum. The polyp was                            sessile. The polyp was removed with a cold biopsy                            forceps. Resection and retrieval were complete.                           Five sessile polyps were found in the descending                            (2) colon and transverse (3) colon. The polyps were                            2 to 8 mm in size. These polyps were removed with a                            cold snare. Resection and retrieval were complete.                           Multiple diverticula were found in the entire colon.                           The exam was otherwise without abnormality on                            direct and retroflexion views.                           There was significant spasm in the entire colon. Complications:             No immediate complications. Estimated Blood Loss:     Estimated blood loss was minimal. Impression:               - One 2 mm polyp in the cecum, removed with a cold                            biopsy forceps. Resected and retrieved.                           - Five 2 to 8 mm polyps in the descending colon and                            in the transverse colon, removed with a cold snare.  Resected and retrieved.                           - Diverticulosis in the entire examined colon.                           - The examination was otherwise normal on direct                            and retroflexion views.                           - Significant colonic spasm. Recommendation:           - Patient has a contact number available for                            emergencies. The signs and symptoms of potential                            delayed complications were discussed with the                            patient. Return to normal activities tomorrow.                            Written discharge instructions were provided to the                            patient.                           - Resume previous diet.                           - Continue present medications.                           - Await pathology results.                           - Repeat colonoscopy is recommended for                            surveillance. The colonoscopy date will be                            determined after pathology results from today's                            exam become available for review. Sir Mallis L. Loletha Carrow, MD 11/01/2017 9:15:30 AM This report has been signed electronically.

## 2017-11-02 ENCOUNTER — Telehealth: Payer: Self-pay | Admitting: *Deleted

## 2017-11-02 NOTE — Telephone Encounter (Signed)
  Follow up Call-  Call back number 11/01/2017  Post procedure Call Back phone  # [phone]  Permission to leave phone message Yes  Some recent data might be hidden     Patient questions:  Do you have a fever, pain , or abdominal swelling? No. Pain Score  0 *  Have you tolerated food without any problems? Yes.    Have you been able to return to your normal activities? Yes.    Do you have any questions about your discharge instructions: Diet   No. Medications  No. Follow up visit  No.  Do you have questions or concerns about your Care? No.  Actions: * If pain score is 4 or above: No action needed, pain <4.

## 2017-11-06 ENCOUNTER — Encounter: Payer: Self-pay | Admitting: Gastroenterology

## 2018-01-08 ENCOUNTER — Ambulatory Visit: Payer: BLUE CROSS/BLUE SHIELD | Attending: Family Medicine | Admitting: Family Medicine

## 2018-01-08 ENCOUNTER — Encounter: Payer: Self-pay | Admitting: Family Medicine

## 2018-01-08 VITALS — BP 109/72 | HR 76 | Temp 98.3°F | Ht 67.0 in | Wt 210.0 lb

## 2018-01-08 DIAGNOSIS — Z85850 Personal history of malignant neoplasm of thyroid: Secondary | ICD-10-CM | POA: Insufficient documentation

## 2018-01-08 DIAGNOSIS — Z7982 Long term (current) use of aspirin: Secondary | ICD-10-CM | POA: Diagnosis not present

## 2018-01-08 DIAGNOSIS — M7712 Lateral epicondylitis, left elbow: Secondary | ICD-10-CM

## 2018-01-08 DIAGNOSIS — E119 Type 2 diabetes mellitus without complications: Secondary | ICD-10-CM | POA: Diagnosis not present

## 2018-01-08 DIAGNOSIS — Z7984 Long term (current) use of oral hypoglycemic drugs: Secondary | ICD-10-CM | POA: Diagnosis not present

## 2018-01-08 DIAGNOSIS — C73 Malignant neoplasm of thyroid gland: Secondary | ICD-10-CM

## 2018-01-08 DIAGNOSIS — L299 Pruritus, unspecified: Secondary | ICD-10-CM | POA: Diagnosis not present

## 2018-01-08 DIAGNOSIS — Z6832 Body mass index (BMI) 32.0-32.9, adult: Secondary | ICD-10-CM | POA: Diagnosis not present

## 2018-01-08 DIAGNOSIS — B353 Tinea pedis: Secondary | ICD-10-CM | POA: Insufficient documentation

## 2018-01-08 DIAGNOSIS — E1169 Type 2 diabetes mellitus with other specified complication: Secondary | ICD-10-CM

## 2018-01-08 DIAGNOSIS — I10 Essential (primary) hypertension: Secondary | ICD-10-CM | POA: Diagnosis not present

## 2018-01-08 DIAGNOSIS — E669 Obesity, unspecified: Secondary | ICD-10-CM | POA: Insufficient documentation

## 2018-01-08 LAB — GLUCOSE, POCT (MANUAL RESULT ENTRY): POC GLUCOSE: 124 mg/dL — AB (ref 70–99)

## 2018-01-08 LAB — POCT GLYCOSYLATED HEMOGLOBIN (HGB A1C): Hemoglobin A1C: 6.5

## 2018-01-08 MED ORDER — LANCETS THIN MISC: 1.0000 | Freq: Every day | 12 refills | Status: AC

## 2018-01-08 MED ORDER — MELOXICAM 7.5 MG PO TABS: 7.5000 mg | ORAL_TABLET | Freq: Every day | ORAL | 1 refills | Status: DC

## 2018-01-08 MED ORDER — CONTOUR NEXT MONITOR W/DEVICE KIT: 1.0000 | PACK | Freq: Every day | 0 refills | Status: AC

## 2018-01-08 MED ORDER — GLUCOSE BLOOD VI STRP: ORAL_STRIP | 12 refills | Status: AC

## 2018-01-08 MED ORDER — TERBINAFINE HCL 1 % EX CREA: 1.0000 "application " | TOPICAL_CREAM | Freq: Two times a day (BID) | CUTANEOUS | 1 refills | Status: DC

## 2018-01-08 MED ORDER — METFORMIN HCL ER 500 MG PO TB24: 500.0000 mg | ORAL_TABLET | Freq: Every day | ORAL | 1 refills | Status: DC

## 2018-01-08 MED ORDER — LISINOPRIL-HYDROCHLOROTHIAZIDE 20-25 MG PO TABS: 1.0000 | ORAL_TABLET | Freq: Every day | ORAL | 1 refills | Status: DC

## 2018-01-08 MED ORDER — HYDROCORTISONE 0.5 % EX CREA: 1.0000 "application " | TOPICAL_CREAM | Freq: Two times a day (BID) | CUTANEOUS | 0 refills | Status: DC

## 2018-01-08 MED FILL — LISINOPRIL-HCTZ 20-25 MG TA: 20-25 | 90 days supply | Qty: 90 | Fill #0

## 2018-01-08 MED FILL — MELOXICAM 7.5 MG TABLET: 7.5 | 30 days supply | Qty: 30 | Fill #0

## 2018-01-08 MED FILL — CONTOUR NEXT STRIPS: 25 days supply | Qty: 100 | Fill #0

## 2018-01-08 MED FILL — MICROLET LANCETS: 25 days supply | Qty: 100 | Fill #0

## 2018-01-08 MED FILL — CONTOUR NEXT EZ METER: W/DEVICE | 30 days supply | Qty: 1 | Fill #0

## 2018-01-08 MED FILL — METFORMIN HCL ER 500 MG TAB: 500 | 90 days supply | Qty: 90 | Fill #0

## 2018-01-08 NOTE — Progress Notes (Signed)
Subjective:  Patient ID: Dalton Arnold, male    DOB: [birth year]  Age: 52 y.o. MRN: [id number]  CC: Diabetes   HPI Dalton Arnold  is a 52 year old male with history of type 2 diabetes mellitus (A1c 6.6), hypertension, thyroid adenocarcinoma diagnosed in 08/2014 (status post left lobectomy, last seen by endocrine in 04/2016 and declined complete lobectomy and radioactive iodine therapy) who presents today for a follow-up visit. He endorses compliance with metformin and denies hypoglycemia, numbness in extremities or visual concerns.  Not up-to-date on annual eye exam.  Also tolerating his antihypertensive and denies adverse effects from medications.  Today he complains of intermittent left elbow pain which she rates as a 3/10 but sometimes gets worse than that to the point where he has difficulty lifting as little as 2 pounds.  He is right-handed and works in [employer].  Denies swelling of his left elbow. Also complains of intermittent pruritus of his trunk for the last 1 month but denies presence of rash.  He uses Dove products and at other times Pantene products.  He does not sweat excessively. He has a rash in between his toes with associated splitting and has used an OTC diabetic cream with some improvement.  He had his colonoscopy in 10/2017 with resection of polyps with pathology in keeping with tubular adenoma, no evidence of malignancy and a 3-year follow-up recommended.  Past Medical History:  Diagnosis Date  . Diabetes mellitus without complication ([place])   . Diarrhea   . Hypertension   . Thyroid mass     Past Surgical History:  Procedure Laterality Date  . ESOPHAGOGASTRODUODENOSCOPY N/A 06/27/2014   Procedure: ESOPHAGOGASTRODUODENOSCOPY (EGD);  Surgeon: Beryle Beams, MD;  Location: [facility];  Service: Endoscopy;  Laterality: N/A;  . HERNIA REPAIR     x2  . NASAL FRACTURE SURGERY    . THYROIDECTOMY Left 08/19/2014   Procedure: LEFT THYROID LOBECTOMY;  Surgeon:  Rozetta Nunnery, MD;  Location: [facility];  Service: ENT;  Laterality: Left;    Family History  Problem Relation Age of Onset  . Thyroid disease Neg Hx   . Colon cancer Neg Hx     No Known Allergies   Outpatient Medications Prior to Visit  Medication Sig Dispense Refill  . aspirin 325 MG tablet Take 325 mg by mouth daily.    Marland Kitchen lisinopril-hydrochlorothiazide (PRINZIDE,ZESTORETIC) 20-25 MG tablet Take 1 tablet by mouth daily. 90 tablet 1  . metFORMIN (GLUCOPHAGE XR) 500 MG 24 hr tablet Take 1 tablet (500 mg total) by mouth daily with breakfast. 90 tablet 1   Facility-Administered Medications Prior to Visit  Medication Dose Route Frequency Provider Last Rate Last Dose  . 0.9 %  sodium chloride infusion  500 mL Intravenous Once Nelida Meuse III, MD        ROS Review of Systems  Constitutional: Negative for activity change and appetite change.  HENT: Negative for sinus pressure and sore throat.   Eyes: Negative for visual disturbance.  Respiratory: Negative for cough, chest tightness and shortness of breath.   Cardiovascular: Negative for chest pain and leg swelling.  Gastrointestinal: Negative for abdominal distention, abdominal pain, constipation and diarrhea.  Endocrine: Negative.   Genitourinary: Negative for dysuria.  Musculoskeletal:       See hpi  Skin:       See hpi  Allergic/Immunologic: Negative.   Neurological: Negative for weakness, light-headedness and numbness.  Psychiatric/Behavioral: Negative for dysphoric mood and suicidal ideas.    Objective:  BP 109/72   Pulse 76   Temp 98.3 F (36.8 C) (Oral)   Ht _0  (1.702 m)   Wt 210 lb (95.3 kg)   SpO2 95%   BMI 32.89 kg/m   BP/Weight 01/08/2018 11/01/2017 11/21/7586  Systolic BP 325 498 -  Diastolic BP 72 264 -  Wt. (Lbs) 210 214 214  BMI 32.89 33.52 33.52      Physical Exam  Constitutional: He is oriented to person, place, and time. He appears well-developed and well-nourished.  Cardiovascular:  Normal rate, normal heart sounds and intact distal pulses.  No murmur heard. Pulmonary/Chest: Effort normal and breath sounds normal. He has no wheezes. He has no rales. He exhibits no tenderness.  Abdominal: Soft. Bowel sounds are normal. He exhibits no distension and no mass. There is no tenderness.  Musculoskeletal: Normal range of motion.  Neurological: He is alert and oriented to person, place, and time.  Skin:  Tinea pedis in 3rd and 4th web spaces of both feet No rash noticed on anterior posterior trunk  Psychiatric: He has a normal mood and affect.     CMP Latest Ref Rng & Units 07/12/2017 04/11/2016 08/14/2014  Glucose 65 - 99 mg/dL 102(H) 105(H) 82  BUN 6 - 24 mg/dL _1 Creatinine 0.76 - 1.27 mg/dL 0.87 0.84 0.71  Sodium 134 - 144 mmol/L 139 136 138  Potassium 3.5 - 5.2 mmol/L 4.7 4.4 4.1  Chloride 96 - 106 mmol/L 103 104 101  CO2 20 - 29 mmol/L _2 Calcium 8.7 - 10.2 mg/dL 8.7 8.9 9.4  Total Protein 6.0 - 8.5 g/dL 6.8 - -  Total Bilirubin 0.0 - 1.2 mg/dL <0.2 - -  Alkaline Phos 39 - 117 IU/L 87 - -  AST 0 - 40 IU/L 27 - -  ALT 0 - 44 IU/L 39 - -    Lipid Panel     Component Value Date/Time   CHOL 234 (H) 07/12/2017 0902   TRIG 134 07/12/2017 0902   HDL 40 07/12/2017 0902   CHOLHDL 5.9 (H) 07/12/2017 0902   CHOLHDL 6.2 (H) 04/11/2016 1009   VLDL 30 04/11/2016 1009   LDLCALC 167 (H) 07/12/2017 0902     Lab Results  Component Value Date   HGBA1C 6.5 01/08/2018     Assessment & Plan:   1. Diabetes mellitus type 2 in obese ([place]) Controlled with A1c of 6.5 Counseled on Diabetic diet, my plate method, 158 minutes of moderate intensity exercise/week Keep blood sugar logs with fasting goals of 80-120 mg/dl, random of less than 180 and in the event of sugars less than 60 mg/dl or greater than 400 mg/dl please notify the clinic ASAP. It is recommended that you undergo annual eye exams and annual foot exams. Pneumonia vaccine is recommended. - POCT  glucose (manual entry) - POCT glycosylated hemoglobin (Hb A1C) - CMP14+EGFR - Lipid panel - Microalbumin/Creatinine Ratio, Urine - metFORMIN (GLUCOPHAGE XR) 500 MG 24 hr tablet; Take 1 tablet (500 mg total) by mouth daily with breakfast.  Dispense: 90 tablet; Refill: 1 - Ambulatory referral to Ophthalmology - Blood Glucose Monitoring Suppl (CONTOUR NEXT MONITOR) w/Device KIT; 1 each by Does not apply route daily.  Dispense: 1 kit; Refill: 0 - glucose blood (CONTOUR NEXT TEST) test strip; Use as instructed daily  Dispense: 30 each; Refill: 12 - Lancets Thin MISC; 1 each by Does not apply route daily.  Dispense: 30 each; Refill: 12  2. HTN (hypertension), benign Controlled  Counseled on blood pressure goal of less than 130/80, low-sodium, DASH diet, medication compliance, 150 minutes of moderate intensity exercise per week. Discussed medication compliance, adverse effects. - lisinopril-hydrochlorothiazide (PRINZIDE,ZESTORETIC) 20-25 MG tablet; Take 1 tablet by mouth daily.  Dispense: 90 tablet; Refill: 1  3. Tinea pedis of both feet - terbinafine (LAMISIL AT) 1 % cream; Apply 1 application topically 2 (two) times daily. Apply to feet  Dispense: 42 g; Refill: 1  4. Pruritus Adviced to avoid scented products and use lukewarm rather than really hot water. - hydrocortisone cream 0.5 %; Apply 1 application topically 2 (two) times daily. Apply to trunk as needed  For itching  Dispense: 56 g; Refill: 0  5. Lateral epicondylitis of left elbow - meloxicam (MOBIC) 7.5 MG tablet; Take 1 tablet (7.5 mg total) by mouth daily.  Dispense: 30 tablet; Refill: 1  6. Thyroid cancer [place]) S/p post partial lobectomy Declines additional treatment or follow-up.   Meds ordered this encounter  Medications  . lisinopril-hydrochlorothiazide (PRINZIDE,ZESTORETIC) 20-25 MG tablet    Sig: Take 1 tablet by mouth daily.    Dispense:  90 tablet    Refill:  1  . metFORMIN (GLUCOPHAGE XR) 500 MG 24 hr tablet     Sig: Take 1 tablet (500 mg total) by mouth daily with breakfast.    Dispense:  90 tablet    Refill:  1  . terbinafine (LAMISIL AT) 1 % cream    Sig: Apply 1 application topically 2 (two) times daily. Apply to feet    Dispense:  42 g    Refill:  1  . hydrocortisone cream 0.5 %    Sig: Apply 1 application topically 2 (two) times daily. Apply to trunk as needed  For itching    Dispense:  56 g    Refill:  0  . meloxicam (MOBIC) 7.5 MG tablet    Sig: Take 1 tablet (7.5 mg total) by mouth daily.    Dispense:  30 tablet    Refill:  1  . Blood Glucose Monitoring Suppl (CONTOUR NEXT MONITOR) w/Device KIT    Sig: 1 each by Does not apply route daily.    Dispense:  1 kit    Refill:  0  . glucose blood (CONTOUR NEXT TEST) test strip    Sig: Use as instructed daily    Dispense:  30 each    Refill:  12  . Lancets Thin MISC    Sig: 1 each by Does not apply route daily.    Dispense:  30 each    Refill:  12    Follow-up: Return in about 6 months (around 07/10/2018) for Follow-up of chronic medical conditions.   Charlott Rakes MD

## 2018-01-08 NOTE — Patient Instructions (Signed)
Athlete's Foot Athlete's foot (tinea pedis) is a fungal infection of the skin on the feet. It often occurs on the skin that is between or underneath the toes. It can also occur on the soles of the feet. The infection can spread from person to person (is contagious). What are the causes? Athlete's foot is caused by a fungus. This fungus grows in warm, moist places. Most people get athlete's foot by sharing shower stalls, towels, and wet floors with someone who is infected. Not washing your feet or changing your socks often enough can contribute to athlete's foot. What increases the risk? This condition is more likely to develop in:  Men.  People who have a weak body defense system (immune system).  People who have diabetes.  People who use public showers, such as at a gym.  People who wear heavy-duty shoes, such as industrial or military shoes.  Seasons with warm, humid weather.  What are the signs or symptoms? Symptoms of this condition include:  Itchy areas between the toes or on the soles of the feet.  White, flaky, or scaly areas between the toes or on the soles of the feet.  Very itchy small blisters between the toes or on the soles of the feet.  Small cuts on the skin. These cuts can become infected.  Thick or discolored toenails.  How is this diagnosed? This condition is diagnosed with a medical history and physical exam. Your health care provider may also take a skin or toenail sample to be examined. How is this treated? Treatment for this condition includes antifungal medicines. These may be applied as powders, ointments, or creams. In severe cases, an oral antifungal medicine may be given. Follow these instructions at home:  Apply or take over-the-counter and prescription medicines only as told by your health care provider.  Keep all follow-up visits as told by your health care provider. This is important.  Do not scratch your feet.  Keep your feet dry: ? Wear  cotton or wool socks. Change your socks every day or if they become wet. ? Wear shoes that allow air to circulate, such as sandals or canvas tennis shoes.  Wash and dry your feet: ? Every day or as told by your health care provider. ? After exercising. ? Including the area between your toes.  Do not share towels, nail clippers, or other personal items that touch your feet with others.  If you have diabetes, keep your blood sugar under control. How is this prevented?  Do not share towels.  Wear sandals in wet areas, such as locker rooms and shared showers.  Keep your feet dry: ? Wear cotton or wool socks. Change your socks every day or if they become wet. ? Wear shoes that allow air to circulate, such as sandals or canvas tennis shoes.  Wash and dry your feet after exercising. Pay attention to the area between your toes. Contact a health care provider if:  You have a fever.  You have swelling, soreness, warmth, or redness in your foot.  You are not getting better with treatment.  Your symptoms get worse.  You have new symptoms. This information is not intended to replace advice given to you by your health care provider. Make sure you discuss any questions you have with your health care provider. Document Released: 09/16/2000 Document Revised: 02/25/2016 Document Reviewed: 03/23/2015 Elsevier Interactive Patient Education  2018 Elsevier Inc.  

## 2018-01-09 ENCOUNTER — Other Ambulatory Visit: Payer: Self-pay | Admitting: Family Medicine

## 2018-01-09 LAB — CMP14+EGFR
ALT: 35 IU/L (ref 0–44)
AST: 22 IU/L (ref 0–40)
Albumin/Globulin Ratio: 1.6 (ref 1.2–2.2)
Albumin: 4.4 g/dL (ref 3.5–5.5)
Alkaline Phosphatase: 91 IU/L (ref 39–117)
BILIRUBIN TOTAL: 0.3 mg/dL (ref 0.0–1.2)
BUN/Creatinine Ratio: 15 (ref 9–20)
BUN: 13 mg/dL (ref 6–24)
CHLORIDE: 99 mmol/L (ref 96–106)
CO2: 22 mmol/L (ref 20–29)
Calcium: 9.2 mg/dL (ref 8.7–10.2)
Creatinine, Ser: 0.85 mg/dL (ref 0.76–1.27)
GFR calc Af Amer: 115 mL/min/{1.73_m2} (ref >59)
GFR calc non Af Amer: 99 mL/min/{1.73_m2} (ref >59)
Globulin, Total: 2.7 g/dL (ref 1.5–4.5)
Glucose: 106 mg/dL — ABNORMAL HIGH (ref 65–99)
POTASSIUM: 4.4 mmol/L (ref 3.5–5.2)
Sodium: 137 mmol/L (ref 134–144)
Total Protein: 7.1 g/dL (ref 6.0–8.5)

## 2018-01-09 LAB — MICROALBUMIN / CREATININE URINE RATIO
Creatinine, Urine: 28.7 mg/dL
Microalb/Creat Ratio: 11.1 mg/g creat (ref 0.0–30.0)
Microalbumin, Urine: 3.2 ug/mL

## 2018-01-09 LAB — LIPID PANEL
Chol/HDL Ratio: 6.6 ratio — ABNORMAL HIGH (ref 0.0–5.0)
Cholesterol, Total: 265 mg/dL — ABNORMAL HIGH (ref 100–199)
HDL: 40 mg/dL (ref >39)
LDL Calculated: 175 mg/dL — ABNORMAL HIGH (ref 0–99)
TRIGLYCERIDES: 250 mg/dL — AB (ref 0–149)
VLDL Cholesterol Cal: 50 mg/dL — ABNORMAL HIGH (ref 5–40)

## 2018-01-09 MED ORDER — ATORVASTATIN CALCIUM 20 MG PO TABS: 20.0000 mg | ORAL_TABLET | Freq: Every day | ORAL | 3 refills | Status: DC

## 2018-01-09 MED FILL — ATORVASTATIN 20 MG TABLET: 20 | 30 days supply | Qty: 30 | Fill #0

## 2018-01-18 ENCOUNTER — Telehealth: Payer: Self-pay

## 2018-01-18 NOTE — Telephone Encounter (Signed)
Patient was called and informed of lab results and medication being added.

## 2018-01-31 ENCOUNTER — Other Ambulatory Visit: Payer: Self-pay

## 2018-01-31 ENCOUNTER — Encounter (HOSPITAL_COMMUNITY): Payer: Self-pay | Admitting: Emergency Medicine

## 2018-01-31 ENCOUNTER — Ambulatory Visit (HOSPITAL_COMMUNITY)
Admission: EM | Admit: 2018-01-31 | Discharge: 2018-01-31 | Disposition: A | Discharge status: Home / Self Care | Payer: BLUE CROSS/BLUE SHIELD | Attending: Emergency Medicine | Admitting: Emergency Medicine

## 2018-01-31 DIAGNOSIS — R0789 Other chest pain: Secondary | ICD-10-CM

## 2018-01-31 MED ORDER — NAPROXEN 500 MG PO TABS: 500.0000 mg | ORAL_TABLET | Freq: Two times a day (BID) | ORAL | 0 refills | Status: DC

## 2018-01-31 NOTE — ED Notes (Signed)
EKG given to [facility]

## 2018-01-31 NOTE — Discharge Instructions (Addendum)
Your EKG was normal, your chest pain appears to be more related to the muscles of the chest wall.  Please take anti-inflammatories like ibuprofen or Tylenol consistently over the next 2 weeks.  I have sent in Naprosyn for you to use as an alternative.  Please go to emergency room if having worsening chest pain, moving towards the left side, chest pain at rest, chest pain with walking or going upstairs, developing nausea/vomiting, lightheadedness or dizziness.

## 2018-01-31 NOTE — ED Provider Notes (Signed)
[place]    CSN: [id number] Arrival date & time: 01/31/18  1009     History   Chief Complaint Chief Complaint  Patient presents with  . Chest Pain    HPI Dalton Arnold is a 52 y.o. male history of diabetes mellitus type 2, hypertension, tobacco abuse, hyperlipidemia presenting today for evaluation of right-sided chest pain.  Patient states that his chest pain started approximately 4 days ago and he feels that his symptoms have worsened.  He does denies pain at rest, only when touching his chest.  Is unsure if this is related to his dental pain as he recently had a couple teeth removed on Friday.  He denies worsening of chest pain with exertion/walking or going upstairs.  Also notices worsening pain with coughing.  Denies any recent illness or recent injury to the area.  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  Patient denies any leg swelling, recent travel or immobilization, previous DVT/PE.  Denies abdominal pain, nausea, vomiting.  Denies any numbness into his arms.  Denies discomfort on his left side.  HPI  Past Medical History:  Diagnosis Date  . Diabetes mellitus without complication ([place])   . Diarrhea   . Hypertension   . Thyroid mass     Patient Active Problem List   Diagnosis Date Noted  . Obesity (BMI 30.0-34.9) 07/11/2017  . Diabetes mellitus type 2 in obese ([place]) 06/20/2016  . HTN (hypertension), benign 06/20/2016  . Thyroid cancer ([place]) 04/22/2016  . Thyroid adenoma 08/19/2014  . Illiteracy and low-level literacy 07/14/2014  . H. pylori duodenitis 07/10/2014  . Duodenitis 06/25/2014  . Acute pancreatitis 06/25/2014  . Alcohol use 06/25/2014  . Smoker 06/25/2014    Past Surgical History:  Procedure Laterality Date  . ESOPHAGOGASTRODUODENOSCOPY N/A 06/27/2014   Procedure: ESOPHAGOGASTRODUODENOSCOPY (EGD);  Surgeon: Beryle Beams, MD;  Location: [facility];  Service: Endoscopy;  Laterality: N/A;  .  HERNIA REPAIR     x2  . NASAL FRACTURE SURGERY    . THYROIDECTOMY Left 08/19/2014   Procedure: LEFT THYROID LOBECTOMY;  Surgeon: Rozetta Nunnery, MD;  Location: [facility];  Service: ENT;  Laterality: Left;       Home Medications    Prior to Admission medications   Medication Sig Start Date End Date Taking? Authorizing Provider  aspirin 325 MG tablet Take 325 mg by mouth daily.    [provider]  atorvastatin (LIPITOR) 20 MG tablet Take 1 tablet (20 mg total) by mouth daily. 01/09/18   Charlott Rakes, MD  Blood Glucose Monitoring Suppl (CONTOUR NEXT MONITOR) w/Device KIT 1 each by Does not apply route daily. 01/08/18   Charlott Rakes, MD  glucose blood (CONTOUR NEXT TEST) test strip Use as instructed daily 01/08/18   Charlott Rakes, MD  hydrocortisone cream 0.5 % Apply 1 application topically 2 (two) times daily. Apply to trunk as needed  For itching 01/08/18   Charlott Rakes, MD  Lancets Thin MISC 1 each by Does not apply route daily. 01/08/18   Charlott Rakes, MD  lisinopril-hydrochlorothiazide (PRINZIDE,ZESTORETIC) 20-25 MG tablet Take 1 tablet by mouth daily. 01/08/18   Charlott Rakes, MD  meloxicam (MOBIC) 7.5 MG tablet Take 1 tablet (7.5 mg total) by mouth daily. 01/08/18   Charlott Rakes, MD  metFORMIN (GLUCOPHAGE XR) 500 MG 24 hr tablet Take 1 tablet (500 mg total) by mouth daily with breakfast. 01/08/18   Charlott Rakes, MD  naproxen (  NAPROSYN) 500 MG tablet Take 1 tablet (500 mg total) by mouth 2 (two) times daily. 01/31/18   Garold Sheeler C, PA-C  terbinafine (LAMISIL AT) 1 % cream Apply 1 application topically 2 (two) times daily. Apply to feet 01/08/18   Charlott Rakes, MD    Family History Family History  Problem Relation Age of Onset  . Thyroid disease Neg Hx   . Colon cancer Neg Hx     Social History Social History   Tobacco Use  . Smoking status: Current Every Day Smoker    Packs/day: 1.00    Types: Cigarettes  . Smokeless tobacco: Never Used  Substance  Use Topics  . Alcohol use: Yes    Comment: occ.   . Drug use: No     Allergies   Patient has no known allergies.   Review of Systems Review of Systems  Constitutional: Negative for activity change, appetite change, fatigue and fever.  HENT: Negative for sore throat.   Eyes: Negative for visual disturbance.  Respiratory: Negative for cough, chest tightness and shortness of breath.   Cardiovascular: Positive for chest pain. Negative for leg swelling.  Gastrointestinal: Negative for abdominal pain and nausea.  Musculoskeletal: Negative for back pain and myalgias.  Skin: Negative for rash and wound.  Neurological: Negative for dizziness, syncope, weakness, light-headedness, numbness and headaches.     Physical Exam Triage Vital Signs ED Triage Vitals  Enc Vitals Group     BP 01/31/18 1038 129/85     Pulse Rate 01/31/18 1038 72     Resp 01/31/18 1038 18     Temp 01/31/18 1038 98.3 F (36.8 C)     Temp Source 01/31/18 1038 Oral     SpO2 01/31/18 1038 99 %     Weight --      Height --      Head Circumference --      Peak Flow --      Pain Score 01/31/18 1036 9     Pain Loc --      Pain Edu? --      Excl. in [place]? --    No data found.  Updated Vital Signs BP 129/85 (BP Location: Left Arm)   Pulse 72   Temp 98.3 F (36.8 C) (Oral)   Resp 18   SpO2 99%   Visual Acuity Right Eye Distance:   Left Eye Distance:   Bilateral Distance:    Right Eye Near:   Left Eye Near:    Bilateral Near:     Physical Exam  Constitutional: He is oriented to person, place, and time. He appears well-developed and well-nourished.  HENT:  Head: Normocephalic and atraumatic.  Eyes: Pupils are equal, round, and reactive to light. Conjunctivae and EOM are normal.  Neck: Neck supple.  Cardiovascular: Normal rate and regular rhythm.  No murmur heard. Pulmonary/Chest: Effort normal and breath sounds normal. No respiratory distress.  Breathing comfortably at rest, CTA BL, tenderness to  palpation over right chest superior to right nipple.  Extends superiorly towards pectoralis insertion.  Abdominal: Soft. There is no tenderness.  Abdomen is soft, nondistended.  Nontender to light and deep palpation  Musculoskeletal: He exhibits no edema.  Neurological: He is alert and oriented to person, place, and time. No cranial nerve deficit.  Skin: Skin is warm and dry.  Psychiatric: He has a normal mood and affect.  Nursing note and vitals reviewed.    UC Treatments / Results  Labs (all labs ordered are listed,  but only abnormal results are displayed) Labs Reviewed - No data to display  EKG None  Radiology No results found.  Procedures Procedures (including critical care time)  Medications Ordered in UC Medications - No data to display  Initial Impression / Assessment and Plan / UC Course  I have reviewed the triage vital signs and the nursing notes.  Pertinent labs & imaging results that were available during my care of the patient were reviewed by me and considered in my medical decision making (see chart for details).     EKG normal sinus rhythm, no signs of ischemia or infarction.  Vital signs stable.  Pain reproducible on exam.  Likely musculoskeletal.  Will treat with anti-inflammatories.  Discussed with patient to go to emergency room if chest pain changing, worsening or developing new symptoms. Discussed strict return precautions. Patient verbalized understanding and is agreeable with plan.  Final Clinical Impressions(s) / UC Diagnoses   Final diagnoses:  Chest wall pain     Discharge Instructions     Your EKG was normal, your chest pain appears to be more related to the muscles of the chest wall.  Please take anti-inflammatories like ibuprofen or Tylenol consistently over the next 2 weeks.  I have sent in Naprosyn for you to use as an alternative.  Please go to emergency room if having worsening chest pain, moving towards the left side, chest pain at  rest, chest pain with walking or going upstairs, developing nausea/vomiting, lightheadedness or dizziness.    ED Prescriptions    Medication Sig Dispense Auth. Provider   naproxen (NAPROSYN) 500 MG tablet Take 1 tablet (500 mg total) by mouth 2 (two) times daily. 30 tablet Kyvon Hu, [place] C, PA-C     Controlled Substance Prescriptions [place] Controlled Substance Registry consulted? Not Applicable   Janith Lima, [place] 01/31/18 1315

## 2018-01-31 NOTE — ED Triage Notes (Signed)
Right chest pain for 4 days, worse with palpation. Describes pain as sore and knife-like. No known injury

## 2018-05-02 MED FILL — METFORMIN HCL ER 500 MG TAB: 500 | 90 days supply | Qty: 90 | Fill #1

## 2018-05-02 MED FILL — ATORVASTATIN 20 MG TABLET: 20 | 30 days supply | Qty: 30 | Fill #1

## 2018-05-02 MED FILL — LISINOPRIL-HCTZ 20-25 MG TA: 20-25 | 90 days supply | Qty: 90 | Fill #1

## 2018-06-06 MED FILL — ATORVASTATIN 20 MG TABLET: 20 | 30 days supply | Qty: 30 | Fill #2

## 2018-07-17 MED FILL — ATORVASTATIN 20 MG TABLET: 20 | 30 days supply | Qty: 30 | Fill #3

## 2018-07-18 ENCOUNTER — Other Ambulatory Visit: Payer: Self-pay

## 2018-07-18 MED ORDER — TAMSULOSIN HCL 0.4 MG PO CAPS: 0.4000 mg | ORAL_CAPSULE | Freq: Every day | ORAL | 0 refills | Status: DC

## 2018-07-18 MED FILL — TAMSULOSIN HCL 0.4 MG CAP: 0.4 | 30 days supply | Qty: 30 | Fill #0

## 2018-08-01 ENCOUNTER — Other Ambulatory Visit: Payer: Self-pay | Admitting: Family Medicine

## 2018-08-01 DIAGNOSIS — I10 Essential (primary) hypertension: Secondary | ICD-10-CM

## 2018-08-01 DIAGNOSIS — E1169 Type 2 diabetes mellitus with other specified complication: Secondary | ICD-10-CM

## 2018-08-01 DIAGNOSIS — E669 Obesity, unspecified: Secondary | ICD-10-CM

## 2018-08-01 MED FILL — METFORMIN HCL ER 500 MG TAB: 500 | 30 days supply | Qty: 30 | Fill #0

## 2018-08-01 MED FILL — LISINOPRIL-HCTZ 20-25 MG TA: 20-25 | 30 days supply | Qty: 30 | Fill #0

## 2018-10-05 ENCOUNTER — Other Ambulatory Visit: Payer: Self-pay | Admitting: Family Medicine

## 2018-10-05 DIAGNOSIS — I10 Essential (primary) hypertension: Secondary | ICD-10-CM

## 2018-10-05 DIAGNOSIS — E1169 Type 2 diabetes mellitus with other specified complication: Secondary | ICD-10-CM

## 2018-10-05 DIAGNOSIS — E119 Type 2 diabetes mellitus without complications: Secondary | ICD-10-CM

## 2018-10-05 DIAGNOSIS — E669 Obesity, unspecified: Secondary | ICD-10-CM

## 2018-11-27 ENCOUNTER — Encounter: Payer: Self-pay | Admitting: Family Medicine

## 2018-11-27 ENCOUNTER — Ambulatory Visit: Payer: BLUE CROSS/BLUE SHIELD | Attending: Family Medicine | Admitting: Family Medicine

## 2018-11-27 VITALS — BP 161/87 | HR 67 | Temp 97.9°F | Ht 67.0 in | Wt 218.0 lb

## 2018-11-27 DIAGNOSIS — E669 Obesity, unspecified: Secondary | ICD-10-CM | POA: Diagnosis not present

## 2018-11-27 DIAGNOSIS — L299 Pruritus, unspecified: Secondary | ICD-10-CM | POA: Diagnosis not present

## 2018-11-27 DIAGNOSIS — I10 Essential (primary) hypertension: Secondary | ICD-10-CM | POA: Diagnosis not present

## 2018-11-27 DIAGNOSIS — E1169 Type 2 diabetes mellitus with other specified complication: Secondary | ICD-10-CM | POA: Diagnosis not present

## 2018-11-27 DIAGNOSIS — E785 Hyperlipidemia, unspecified: Secondary | ICD-10-CM | POA: Insufficient documentation

## 2018-11-27 DIAGNOSIS — B353 Tinea pedis: Secondary | ICD-10-CM

## 2018-11-27 DIAGNOSIS — E119 Type 2 diabetes mellitus without complications: Secondary | ICD-10-CM

## 2018-11-27 DIAGNOSIS — E7800 Pure hypercholesterolemia, unspecified: Secondary | ICD-10-CM

## 2018-11-27 LAB — POCT GLYCOSYLATED HEMOGLOBIN (HGB A1C): HbA1c, POC (controlled diabetic range): 6.8 % (ref 0.0–7.0)

## 2018-11-27 LAB — GLUCOSE, POCT (MANUAL RESULT ENTRY): POC Glucose: 128 mg/dl — AB (ref 70–99)

## 2018-11-27 MED ORDER — HYDROCORTISONE 0.5 % EX CREA: 1.0000 "application " | TOPICAL_CREAM | Freq: Two times a day (BID) | CUTANEOUS | 1 refills | Status: DC

## 2018-11-27 MED ORDER — ATORVASTATIN CALCIUM 20 MG PO TABS: 20.0000 mg | ORAL_TABLET | Freq: Every day | ORAL | 6 refills | Status: DC

## 2018-11-27 MED ORDER — ATORVASTATIN CALCIUM 40 MG PO TABS: 40.0000 mg | ORAL_TABLET | Freq: Every day | ORAL | 6 refills | Status: DC

## 2018-11-27 MED ORDER — TERBINAFINE HCL 1 % EX CREA: 1.0000 "application " | TOPICAL_CREAM | Freq: Two times a day (BID) | CUTANEOUS | 3 refills | Status: DC

## 2018-11-27 MED ORDER — TAMSULOSIN HCL 0.4 MG PO CAPS: 0.4000 mg | ORAL_CAPSULE | Freq: Every day | ORAL | 6 refills | Status: DC

## 2018-11-27 MED ORDER — LISINOPRIL-HYDROCHLOROTHIAZIDE 20-25 MG PO TABS: 1.0000 | ORAL_TABLET | Freq: Every day | ORAL | 6 refills | Status: DC

## 2018-11-27 MED ORDER — METFORMIN HCL ER 500 MG PO TB24: 500.0000 mg | ORAL_TABLET | Freq: Every day | ORAL | 6 refills | Status: DC

## 2018-11-27 MED FILL — TAMSULOSIN HCL 0.4 MG CAP: 0.4 | 30 days supply | Qty: 30 | Fill #0

## 2018-11-27 MED FILL — LISINOPRIL-HCTZ 20-25 MG TA: 20-25 | 30 days supply | Qty: 30 | Fill #0

## 2018-11-27 MED FILL — ATORVASTATIN CALCIUM 40 MG: 40 | 30 days supply | Qty: 30 | Fill #0

## 2018-11-27 MED FILL — METFORMIN HCL ER 500 MG TAB: 500 | 30 days supply | Qty: 30 | Fill #0

## 2018-11-27 NOTE — Progress Notes (Signed)
Subjective:  Patient ID: Dalton Arnold, male    DOB: 10/31/1965  Age: 53 y.o. MRN: [id number]  CC: Diabetes   HPI Dalton Arnold  a 53 year old male with history of type 2 diabetes mellitus (A1c 6.6), hypertension, thyroid adenocarcinoma diagnosed in 08/2014 (status post left lobectomy, last seen by endocrine in 04/2016 and declined complete lobectomy and radioactive iodine therapy) who presents today for a follow-up visit. He was last seen in the clinic 10 months ago and has been out of his antihypertensives hence his elevated blood pressure.  He has also been out of metformin and his statin. He has kept a check on his blood sugars which he states have ranged in the 130-140 range for fasting sugars.  He denies blurry vision, numbness in extremities, hypoglycemia, chest pains or dyspnea.  He complains of intermittent itching in different aspects of his skin which include his trunk, his thighs, his arms but denies the presence of a rash.  Denies allergies to foods or creams or introduction of new body products or detergents. He has also noticed cracking of the webspaces of his right foot to which he has applied OTC diabetic creams.  Past Medical History:  Diagnosis Date  . Diabetes mellitus without complication ([place])   . Diarrhea   . Hypertension   . Thyroid mass     Past Surgical History:  Procedure Laterality Date  . ESOPHAGOGASTRODUODENOSCOPY N/A 06/27/2014   Procedure: ESOPHAGOGASTRODUODENOSCOPY (EGD);  Surgeon: Beryle Beams, MD;  Location: [facility];  Service: Endoscopy;  Laterality: N/A;  . HERNIA REPAIR     x2  . NASAL FRACTURE SURGERY    . THYROIDECTOMY Left 08/19/2014   Procedure: LEFT THYROID LOBECTOMY;  Surgeon: Rozetta Nunnery, MD;  Location: [facility];  Service: ENT;  Laterality: Left;    Family History  Problem Relation Age of Onset  . Thyroid disease Neg Hx   . Colon cancer Neg Hx     No Known Allergies  Outpatient Medications Prior to Visit    Medication Sig Dispense Refill  . aspirin 325 MG tablet Take 325 mg by mouth daily.    . Blood Glucose Monitoring Suppl (CONTOUR NEXT MONITOR) w/Device KIT 1 each by Does not apply route daily. (Patient not taking: Reported on 11/27/2018) 1 kit 0  . glucose blood (CONTOUR NEXT TEST) test strip Use as instructed daily (Patient not taking: Reported on 11/27/2018) 30 each 12  . Lancets Thin MISC 1 each by Does not apply route daily. (Patient not taking: Reported on 11/27/2018) 30 each 12  . meloxicam (MOBIC) 7.5 MG tablet Take 1 tablet (7.5 mg total) by mouth daily. (Patient not taking: Reported on 11/27/2018) 30 tablet 1  . naproxen (NAPROSYN) 500 MG tablet Take 1 tablet (500 mg total) by mouth 2 (two) times daily. (Patient not taking: Reported on 11/27/2018) 30 tablet 0  . atorvastatin (LIPITOR) 20 MG tablet Take 1 tablet (20 mg total) by mouth daily. MUST MAKE APPT FOR FURTHER REFILLS (Patient not taking: Reported on 11/27/2018) 30 tablet 0  . hydrocortisone cream 0.5 % Apply 1 application topically 2 (two) times daily. Apply to trunk as needed  For itching (Patient not taking: Reported on 11/27/2018) 56 g 0  . lisinopril-hydrochlorothiazide (PRINZIDE,ZESTORETIC) 20-25 MG tablet Take 1 tablet by mouth daily. MUST MAKE APPT FOR FURTHER REFILLS (Patient not taking: Reported on 11/27/2018) 30 tablet 0  . metFORMIN (GLUCOPHAGE-XR) 500 MG 24 hr tablet Take 1 tablet (500 mg total) by mouth daily  with breakfast. MUST MAKE APPT FOR FURTHER REFILLS (Patient not taking: Reported on 11/27/2018) 30 tablet 0  . tamsulosin (FLOMAX) 0.4 MG CAPS capsule Take 1 capsule (0.4 mg total) by mouth daily. (Patient not taking: Reported on 11/27/2018) 30 capsule 0  . terbinafine (LAMISIL AT) 1 % cream Apply 1 application topically 2 (two) times daily. Apply to feet (Patient not taking: Reported on 11/27/2018) 42 g 1   Facility-Administered Medications Prior to Visit  Medication Dose Route Frequency Provider Last Rate Last Dose  . 0.9  %  sodium chloride infusion  500 mL Intravenous Once Nelida Meuse III, MD         ROS Review of Systems General: negative for fever, weight loss, appetite change Eyes: no visual symptoms. ENT: no ear symptoms, no sinus tenderness, no nasal congestion or sore throat. Neck: no pain  Respiratory: no wheezing, shortness of breath, cough Cardiovascular: no chest pain, no dyspnea on exertion, no pedal edema, no orthopnea. Gastrointestinal: no abdominal pain, no diarrhea, no constipation Genito-Urinary: no urinary frequency, no dysuria, no polyuria. Hematologic: no bruising Endocrine: no cold or heat intolerance Neurological: no headaches, no seizures, no tremors Musculoskeletal: no joint pains, no joint swelling Skin: +pruritus, + rash. Psychological: no depression, no anxiety,    Objective:  BP (!) 161/87   Pulse 67   Temp 97.9 F (36.6 C) (Oral)   Ht _0  (1.702 m)   Wt 218 lb (98.9 kg)   SpO2 99%   BMI 34.14 kg/m   BP/Weight 11/27/2018 0/12/5007 12/09/1827  Systolic BP 937 169 678  Diastolic BP 87 85 72  Wt. (Lbs) 218 - 210  BMI 34.14 - 32.89      Physical Exam Constitutional: normal appearing,  Eyes: PERRLA HEENT: Head is atraumatic, normal sinuses, normal oropharynx, normal appearing tonsils and palate, tympanic membrane is normal bilaterally. Neck: normal range of motion, no thyromegaly, no JVD Cardiovascular: normal rate and rhythm, normal heart sounds, no murmurs, rub or gallop, no pedal edema Respiratory: Normal breath sounds, clear to auscultation bilaterally, no wheezes, no rales, no rhonchi Abdomen: soft, not tender to palpation, normal bowel sounds, no enlarged organs Musculoskeletal: Full ROM, no tenderness in joints Skin: Tinea pedis with cracking of skin and second, third and fourth webspaces of right foot Neurological: alert, oriented x3, cranial nerves I-XII grossly intact , normal motor strength, normal sensation. Psychological: normal mood.   CMP  Latest Ref Rng & Units 01/08/2018 07/12/2017 04/11/2016  Glucose 65 - 99 mg/dL 106(H) 102(H) 105(H)  BUN 6 - 24 mg/dL _1 Creatinine 0.76 - 1.27 mg/dL 0.85 0.87 0.84  Sodium 134 - 144 mmol/L 137 139 136  Potassium 3.5 - 5.2 mmol/L 4.4 4.7 4.4  Chloride 96 - 106 mmol/L 99 103 104  CO2 20 - 29 mmol/L _2 Calcium 8.7 - 10.2 mg/dL 9.2 8.7 8.9  Total Protein 6.0 - 8.5 g/dL 7.1 6.8 -  Total Bilirubin 0.0 - 1.2 mg/dL 0.3 <0.2 -  Alkaline Phos 39 - 117 IU/L 91 87 -  AST 0 - 40 IU/L 22 27 -  ALT 0 - 44 IU/L 35 39 -    Lipid Panel     Component Value Date/Time   CHOL 265 (H) 01/08/2018 1226   TRIG 250 (H) 01/08/2018 1226   HDL 40 01/08/2018 1226   CHOLHDL 6.6 (H) 01/08/2018 1226   CHOLHDL 6.2 (H) 04/11/2016 1009   VLDL 30 04/11/2016 1009   LDLCALC 175 (H)  01/08/2018 1226    CBC    Component Value Date/Time   WBC 9.3 04/11/2016 1009   RBC 5.99 (H) 04/11/2016 1009   HGB 15.7 04/11/2016 1009   HCT 46.1 04/11/2016 1009   PLT 319 04/11/2016 1009   MCV 77.0 (L) 04/11/2016 1009   MCH 26.2 (L) 04/11/2016 1009   MCHC 34.1 04/11/2016 1009   RDW 16.1 (H) 04/11/2016 1009   LYMPHSABS 2,697 04/11/2016 1009   MONOABS 558 04/11/2016 1009   EOSABS 279 04/11/2016 1009   BASOSABS 93 04/11/2016 1009    Lab Results  Component Value Date   HGBA1C 6.8 11/27/2018    The 10-year ASCVD risk score Mikey Bussing DC Jr., et al., 2013) is: 45.9%   Values used to calculate the score:     Age: 16 years     Sex: Male     Is Non-Hispanic African American: No     Diabetic: Yes     Tobacco smoker: Yes     Systolic Blood Pressure: 010 mmHg     Is BP treated: Yes     HDL Cholesterol: 40 mg/dL     Total Cholesterol: 265 mg/dL  Assessment & Plan:   1. Diabetes mellitus type 2 in obese ([place]) Controlled Counseled on Diabetic diet, my plate method, 932 minutes of moderate intensity exercise/week Keep blood sugar logs with fasting goals of 80-120 mg/dl, random of less than 180 and in the event of  sugars less than 60 mg/dl or greater than 400 mg/dl please notify the clinic ASAP. It is recommended that you undergo annual eye exams and annual foot exams. Pneumonia vaccine is recommended. - POCT glucose (manual entry) - POCT glycosylated hemoglobin (Hb A1C) - Lipid panel - Microalbumin / creatinine urine ratio - metFORMIN (GLUCOPHAGE-XR) 500 MG 24 hr tablet; Take 1 tablet (500 mg total) by mouth daily with breakfast.  Dispense: 30 tablet; Refill: 6  2. HTN (hypertension), benign Uncontrolled due to running out of antihypertensives which I refilled Counseled on blood pressure goal of less than 130/80, low-sodium, DASH diet, medication compliance, 150 minutes of moderate intensity exercise per week. Discussed medication compliance, adverse effects. - CMP14+EGFR - lisinopril-hydrochlorothiazide (PRINZIDE,ZESTORETIC) 20-25 MG tablet; Take 1 tablet by mouth daily.  Dispense: 30 tablet; Refill: 6  3. Tinea pedis of both feet Discussed foot care - terbinafine (LAMISIL AT) 1 % cream; Apply 1 application topically 2 (two) times daily. Apply to feet  Dispense: 42 g; Refill: 3  4. Pruritus No rash evident on exam - hydrocortisone cream 0.5 %; Apply 1 application topically 2 (two) times daily. Apply to trunk as needed  For itching  Dispense: 56 g; Refill: 1  5. Pure hypercholesterolemia Increase Lipitor to 40 mg given ASCVD risk of 45.9% He has been out of his Lipitor Low-cholesterol diet  Meds ordered this encounter  Medications  . tamsulosin (FLOMAX) 0.4 MG CAPS capsule    Sig: Take 1 capsule (0.4 mg total) by mouth daily.    Dispense:  30 capsule    Refill:  6  . metFORMIN (GLUCOPHAGE-XR) 500 MG 24 hr tablet    Sig: Take 1 tablet (500 mg total) by mouth daily with breakfast.    Dispense:  30 tablet    Refill:  6  . DISCONTD: atorvastatin (LIPITOR) 20 MG tablet    Sig: Take 1 tablet (20 mg total) by mouth daily.    Dispense:  30 tablet    Refill:  6  .  lisinopril-hydrochlorothiazide (PRINZIDE,ZESTORETIC) 20-25 MG tablet  Sig: Take 1 tablet by mouth daily.    Dispense:  30 tablet    Refill:  6  . terbinafine (LAMISIL AT) 1 % cream    Sig: Apply 1 application topically 2 (two) times daily. Apply to feet    Dispense:  42 g    Refill:  3  . hydrocortisone cream 0.5 %    Sig: Apply 1 application topically 2 (two) times daily. Apply to trunk as needed  For itching    Dispense:  56 g    Refill:  1  . atorvastatin (LIPITOR) 40 MG tablet    Sig: Take 1 tablet (40 mg total) by mouth daily.    Dispense:  30 tablet    Refill:  6    Disregard 20 mg    Follow-up: Return in about 6 months (around 05/28/2019) for Follow-up of chronic medical conditions.       Charlott Rakes, MD, FAAFP. [facility] [place], [place]   11/27/2018, 11:02 AM

## 2018-11-27 NOTE — Patient Instructions (Signed)
Athlete's Foot    Athlete's foot (tinea pedis) is a fungal infection of the skin on your feet. It often occurs on the skin that is between or underneath your toes. It can also occur on the soles of your feet. Symptoms include itchy or white and flaky areas on the skin. The infection can spread from person to person (is contagious). It can also spread when a person's bare feet come in contact with the fungus on shower floors or on items such as shoes.  Follow these instructions at home:  Medicines  · Apply or take over-the-counter and prescription medicines only as told by your doctor.  · Apply your antifungal medicine as told by your doctor. Do not stop using the medicine even if your feet start to get better.  Foot care  · Do not scratch your feet.  · Keep your feet dry:  ? Wear cotton or wool socks. Change your socks every day or if they become wet.  ? Wear shoes that allow air to move around, such as sandals or canvas tennis shoes.  · Wash and dry your feet:  ? Every day or as told by your doctor.  ? After exercising.  ? Including the area between your toes.  General instructions  · Do not share any of these items that touch your feet:  ? Towels.  ? Shoes.  ? Nail clippers.  ? Other personal items.  · Protect your feet by wearing sandals in wet areas, such as locker rooms and shared showers.  · Keep all follow-up visits as told by your doctor. This is important.  · If you have diabetes, keep your blood sugar under control.  Contact a doctor if:  · You have a fever.  · You have swelling, pain, warmth, or redness in your foot.  · Your feet are not getting better with treatment.  · Your symptoms get worse.  · You have new symptoms.  Summary  · Athlete's foot is a fungal infection of the skin on your feet.  · Symptoms include itchy or white and flaky areas on the skin.  · Apply your antifungal medicine as told by your doctor.  · Keep your feet clean and dry.  This information is not intended to replace advice given  to you by your health care provider. Make sure you discuss any questions you have with your health care provider.  Document Released: 03/07/2008 Document Revised: 07/10/2017 Document Reviewed: 07/10/2017  Elsevier Interactive Patient Education © 2019 [employer]

## 2018-11-27 NOTE — Progress Notes (Signed)
Patient states that he has been out of medication for 3 months.

## 2018-11-28 LAB — CMP14+EGFR
A/G RATIO: 1.7 (ref 1.2–2.2)
ALT: 28 IU/L (ref 0–44)
AST: 23 IU/L (ref 0–40)
Albumin: 4.5 g/dL (ref 3.8–4.9)
Alkaline Phosphatase: 97 IU/L (ref 39–117)
BUN/Creatinine Ratio: 11 (ref 9–20)
BUN: 11 mg/dL (ref 6–24)
Bilirubin Total: 0.2 mg/dL (ref 0.0–1.2)
CALCIUM: 9.2 mg/dL (ref 8.7–10.2)
CO2: 22 mmol/L (ref 20–29)
CREATININE: 0.97 mg/dL (ref 0.76–1.27)
Chloride: 104 mmol/L (ref 96–106)
GFR, EST AFRICAN AMERICAN: 102 mL/min/{1.73_m2} (ref >59)
GFR, EST NON AFRICAN AMERICAN: 88 mL/min/{1.73_m2} (ref >59)
GLOBULIN, TOTAL: 2.7 g/dL (ref 1.5–4.5)
Glucose: 97 mg/dL (ref 65–99)
Potassium: 5 mmol/L (ref 3.5–5.2)
Sodium: 139 mmol/L (ref 134–144)
TOTAL PROTEIN: 7.2 g/dL (ref 6.0–8.5)

## 2018-11-28 LAB — LIPID PANEL
CHOL/HDL RATIO: 5.8 ratio — AB (ref 0.0–5.0)
Cholesterol, Total: 226 mg/dL — ABNORMAL HIGH (ref 100–199)
HDL: 39 mg/dL — AB (ref >39)
LDL CALC: 153 mg/dL — AB (ref 0–99)
TRIGLYCERIDES: 172 mg/dL — AB (ref 0–149)
VLDL Cholesterol Cal: 34 mg/dL (ref 5–40)

## 2018-11-28 LAB — MICROALBUMIN / CREATININE URINE RATIO
CREATININE, UR: 32.3 mg/dL
Microalb/Creat Ratio: 31 mg/g creat — ABNORMAL HIGH (ref 0–29)
Microalbumin, Urine: 9.9 ug/mL

## 2018-12-07 ENCOUNTER — Telehealth: Payer: Self-pay

## 2018-12-07 NOTE — Telephone Encounter (Signed)
-----   Message from Charlott Rakes, MD sent at 11/28/2018  6:29 PM EST ----- Cholesterol is elevated due to being without medications for a while. No regimen changes, continue low cholesterol diet as well.

## 2018-12-07 NOTE — Telephone Encounter (Signed)
Patient was called and a voicemail was left informing patient to return phone call for lab results. 

## 2018-12-10 NOTE — Telephone Encounter (Signed)
-----   Message from Charlott Rakes, MD sent at 11/28/2018  6:29 PM EST ----- Cholesterol is elevated due to being without medications for a while. No regimen changes, continue low cholesterol diet as well.

## 2018-12-10 NOTE — Telephone Encounter (Signed)
Patient was called and informed of lab results. 

## 2018-12-28 MED FILL — TAMSULOSIN HCL 0.4 MG CAP: 0.4 | 30 days supply | Qty: 30 | Fill #1

## 2018-12-28 MED FILL — LISINOPRIL-HCTZ 20-25 MG TA: 20-25 | 30 days supply | Qty: 30 | Fill #1

## 2018-12-28 MED FILL — ATORVASTATIN CALCIUM 40 MG: 40 | 30 days supply | Qty: 30 | Fill #1

## 2018-12-28 MED FILL — METFORMIN HCL ER 500 MG TAB: 500 | 30 days supply | Qty: 30 | Fill #1

## 2019-03-11 MED FILL — ATORVASTATIN CALCIUM 40 MG: 40 | 30 days supply | Qty: 30 | Fill #2

## 2019-03-11 MED FILL — TAMSULOSIN HCL 0.4 MG CAP: 0.4 | 30 days supply | Qty: 30 | Fill #2

## 2019-03-11 MED FILL — METFORMIN HCL ER 500 MG TAB: 500 | 30 days supply | Qty: 30 | Fill #2

## 2019-04-11 MED FILL — ATORVASTATIN CALCIUM 40 MG: 40 | 30 days supply | Qty: 30 | Fill #3

## 2019-04-11 MED FILL — METFORMIN HCL ER 500 MG TB2: 500 | 30 days supply | Qty: 30 | Fill #3

## 2019-04-11 MED FILL — TAMSULOSIN HCL 0.4 MG CAP: 0.4 | 30 days supply | Qty: 30 | Fill #3

## 2019-05-13 MED FILL — TAMSULOSIN HCL 0.4 MG CAP: 0.4 | 30 days supply | Qty: 30 | Fill #4

## 2019-05-13 MED FILL — ATORVASTATIN CALCIUM 40 MG: 40 | 30 days supply | Qty: 30 | Fill #4

## 2019-05-13 MED FILL — metFORMIN HCL ER 500 MG TB2: 500 | 30 days supply | Qty: 30 | Fill #4

## 2019-05-20 ENCOUNTER — Ambulatory Visit: Payer: BC Managed Care – PPO | Attending: Family Medicine | Admitting: Family Medicine

## 2019-05-20 ENCOUNTER — Other Ambulatory Visit: Payer: Self-pay

## 2019-05-20 ENCOUNTER — Encounter: Payer: Self-pay | Admitting: Family Medicine

## 2019-05-20 VITALS — BP 128/77 | HR 72 | Temp 98.2°F | Ht 67.0 in | Wt 206.2 lb

## 2019-05-20 DIAGNOSIS — F17210 Nicotine dependence, cigarettes, uncomplicated: Secondary | ICD-10-CM | POA: Diagnosis not present

## 2019-05-20 DIAGNOSIS — E7800 Pure hypercholesterolemia, unspecified: Secondary | ICD-10-CM | POA: Diagnosis not present

## 2019-05-20 DIAGNOSIS — E1169 Type 2 diabetes mellitus with other specified complication: Secondary | ICD-10-CM

## 2019-05-20 DIAGNOSIS — F17200 Nicotine dependence, unspecified, uncomplicated: Secondary | ICD-10-CM

## 2019-05-20 DIAGNOSIS — I10 Essential (primary) hypertension: Secondary | ICD-10-CM | POA: Diagnosis not present

## 2019-05-20 DIAGNOSIS — R3989 Other symptoms and signs involving the genitourinary system: Secondary | ICD-10-CM

## 2019-05-20 DIAGNOSIS — Z6832 Body mass index (BMI) 32.0-32.9, adult: Secondary | ICD-10-CM

## 2019-05-20 DIAGNOSIS — E669 Obesity, unspecified: Secondary | ICD-10-CM

## 2019-05-20 DIAGNOSIS — R399 Unspecified symptoms and signs involving the genitourinary system: Secondary | ICD-10-CM

## 2019-05-20 LAB — POCT GLYCOSYLATED HEMOGLOBIN (HGB A1C): HbA1c, POC (controlled diabetic range): 6.5 % (ref 0.0–7.0)

## 2019-05-20 LAB — GLUCOSE, POCT (MANUAL RESULT ENTRY): POC Glucose: 117 mg/dl — AB (ref 70–99)

## 2019-05-20 MED ORDER — BUPROPION HCL ER (SR) 100 MG PO TB12: 100.0000 mg | ORAL_TABLET | Freq: Two times a day (BID) | ORAL | 3 refills | Status: DC

## 2019-05-20 MED ORDER — ATORVASTATIN CALCIUM 40 MG PO TABS: 40.0000 mg | ORAL_TABLET | Freq: Every day | ORAL | 6 refills | Status: DC

## 2019-05-20 MED ORDER — METFORMIN HCL ER 500 MG PO TB24: 500.0000 mg | ORAL_TABLET | Freq: Every day | ORAL | 6 refills | Status: DC

## 2019-05-20 MED ORDER — TAMSULOSIN HCL 0.4 MG PO CAPS: 0.4000 mg | ORAL_CAPSULE | Freq: Every day | ORAL | 6 refills | Status: DC

## 2019-05-20 MED FILL — BUPROPION HCL SR 100 MG TAB: 100 | 30 days supply | Qty: 60 | Fill #0

## 2019-05-20 NOTE — Progress Notes (Signed)
Patient states that he stopped taking his blood pressure medication due to dizziness after taking.  Patient would like medication to stop smoking.

## 2019-05-20 NOTE — Progress Notes (Signed)
Subjective:  Patient ID: Dalton Arnold, male    DOB: 07/07/1966  Age: 53 y.o. MRN: [id number]  CC: Diabetes   HPI Dalton Arnold  is a 53 year old male with history of type 2 diabetes mellitus (A1c 6.6), hypertension, thyroid adenocarcinoma diagnosed in 08/2014 (status post left lobectomy, last seen by endocrine in 04/2016 and declined complete lobectomy and radioactive iodine therapy) who presents today for a follow-up visit. He discontinued his antihypertensive due to dizziness and his blood pressures have been normal without it. He is compliant with Metformin for Diabetes, last eye exam was in 04/2018. Compliant with statin and exercises regularly. Doing well on his Flomax. He would like to have medication to help quit smoking; currently smoking 2 packs/day.  Past Medical History:  Diagnosis Date  . Diabetes mellitus without complication ([place])   . Diarrhea   . Hypertension   . Thyroid mass     Past Surgical History:  Procedure Laterality Date  . ESOPHAGOGASTRODUODENOSCOPY N/A 06/27/2014   Procedure: ESOPHAGOGASTRODUODENOSCOPY (EGD);  Surgeon: Beryle Beams, MD;  Location: [facility];  Service: Endoscopy;  Laterality: N/A;  . HERNIA REPAIR     x2  . NASAL FRACTURE SURGERY    . THYROIDECTOMY Left 08/19/2014   Procedure: LEFT THYROID LOBECTOMY;  Surgeon: Rozetta Nunnery, MD;  Location: [facility];  Service: ENT;  Laterality: Left;    Family History  Problem Relation Age of Onset  . Thyroid disease Neg Hx   . Colon cancer Neg Hx     No Known Allergies  Outpatient Medications Prior to Visit  Medication Sig Dispense Refill  . aspirin 325 MG tablet Take 325 mg by mouth daily.    . hydrocortisone cream 0.5 % Apply 1 application topically 2 (two) times daily. Apply to trunk as needed  For itching 56 g 1  . terbinafine (LAMISIL AT) 1 % cream Apply 1 application topically 2 (two) times daily. Apply to feet 42 g 3  . atorvastatin (LIPITOR) 40 MG tablet Take 1 tablet (40 mg  total) by mouth daily. 30 tablet 6  . lisinopril-hydrochlorothiazide (PRINZIDE,ZESTORETIC) 20-25 MG tablet Take 1 tablet by mouth daily. 30 tablet 6  . metFORMIN (GLUCOPHAGE-XR) 500 MG 24 hr tablet Take 1 tablet (500 mg total) by mouth daily with breakfast. 30 tablet 6  . tamsulosin (FLOMAX) 0.4 MG CAPS capsule Take 1 capsule (0.4 mg total) by mouth daily. 30 capsule 6  . Blood Glucose Monitoring Suppl (CONTOUR NEXT MONITOR) w/Device KIT 1 each by Does not apply route daily. (Patient not taking: Reported on 11/27/2018) 1 kit 0  . glucose blood (CONTOUR NEXT TEST) test strip Use as instructed daily (Patient not taking: Reported on 11/27/2018) 30 each 12  . Lancets Thin MISC 1 each by Does not apply route daily. (Patient not taking: Reported on 11/27/2018) 30 each 12  . meloxicam (MOBIC) 7.5 MG tablet Take 1 tablet (7.5 mg total) by mouth daily. (Patient not taking: Reported on 11/27/2018) 30 tablet 1  . naproxen (NAPROSYN) 500 MG tablet Take 1 tablet (500 mg total) by mouth 2 (two) times daily. (Patient not taking: Reported on 11/27/2018) 30 tablet 0   Facility-Administered Medications Prior to Visit  Medication Dose Route Frequency Provider Last Rate Last Dose  . 0.9 %  sodium chloride infusion  500 mL Intravenous Once Nelida Meuse III, MD         ROS Review of Systems  Constitutional: Negative for activity change and appetite change.  HENT:  Negative for sinus pressure and sore throat.   Eyes: Negative for visual disturbance.  Respiratory: Negative for cough, chest tightness and shortness of breath.   Cardiovascular: Negative for chest pain and leg swelling.  Gastrointestinal: Negative for abdominal distention, abdominal pain, constipation and diarrhea.  Endocrine: Negative.   Genitourinary: Negative for dysuria.  Musculoskeletal: Negative for joint swelling and myalgias.  Skin: Negative for rash.  Allergic/Immunologic: Negative.   Neurological: Negative for weakness, light-headedness and  numbness.  Psychiatric/Behavioral: Negative for dysphoric mood and suicidal ideas.    Objective:  BP 128/77   Pulse 72   Temp 98.2 F (36.8 C) (Oral)   Ht 5' 7"  (1.702 m)   Wt 206 lb 3.2 oz (93.5 kg)   SpO2 97%   BMI 32.30 kg/m   BP/Weight 05/20/2019 8/75/6433 11/11/5186  Systolic BP 416 606 301  Diastolic BP 77 87 85  Wt. (Lbs) 206.2 218 -  BMI 32.3 34.14 -      Physical Exam Constitutional:      Appearance: He is well-developed.  Cardiovascular:     Rate and Rhythm: Normal rate.     Heart sounds: Normal heart sounds. No murmur.  Pulmonary:     Effort: Pulmonary effort is normal.     Breath sounds: Normal breath sounds. No wheezing or rales.  Chest:     Chest wall: No tenderness.  Abdominal:     General: Bowel sounds are normal. There is no distension.     Palpations: Abdomen is soft. There is no mass.     Tenderness: There is no abdominal tenderness.  Musculoskeletal: Normal range of motion.  Neurological:     Mental Status: He is alert and oriented to person, place, and time.  Psychiatric:        Mood and Affect: Mood normal.     CMP Latest Ref Rng & Units 11/27/2018 01/08/2018 07/12/2017  Glucose 65 - 99 mg/dL 97 106(H) 102(H)  BUN 6 - 24 mg/dL 11 13 9   Creatinine 0.76 - 1.27 mg/dL 0.97 0.85 0.87  Sodium 134 - 144 mmol/L 139 137 139  Potassium 3.5 - 5.2 mmol/L 5.0 4.4 4.7  Chloride 96 - 106 mmol/L 104 99 103  CO2 20 - 29 mmol/L 22 22 21   Calcium 8.7 - 10.2 mg/dL 9.2 9.2 8.7  Total Protein 6.0 - 8.5 g/dL 7.2 7.1 6.8  Total Bilirubin 0.0 - 1.2 mg/dL 0.2 0.3 <0.2  Alkaline Phos 39 - 117 IU/L 97 91 87  AST 0 - 40 IU/L 23 22 27   ALT 0 - 44 IU/L 28 35 39    Lipid Panel     Component Value Date/Time   CHOL 226 (H) 11/27/2018 1133   TRIG 172 (H) 11/27/2018 1133   HDL 39 (L) 11/27/2018 1133   CHOLHDL 5.8 (H) 11/27/2018 1133   CHOLHDL 6.2 (H) 04/11/2016 1009   VLDL 30 04/11/2016 1009   LDLCALC 153 (H) 11/27/2018 1133    CBC    Component Value Date/Time    WBC 9.3 04/11/2016 1009   RBC 5.99 (H) 04/11/2016 1009   HGB 15.7 04/11/2016 1009   HCT 46.1 04/11/2016 1009   PLT 319 04/11/2016 1009   MCV 77.0 (L) 04/11/2016 1009   MCH 26.2 (L) 04/11/2016 1009   MCHC 34.1 04/11/2016 1009   RDW 16.1 (H) 04/11/2016 1009   LYMPHSABS 2,697 04/11/2016 1009   MONOABS 558 04/11/2016 1009   EOSABS 279 04/11/2016 1009   BASOSABS 93 04/11/2016 1009  Lab Results  Component Value Date   HGBA1C 6.5 05/20/2019    Assessment & Plan:   1. Diabetes mellitus type 2 in obese ([place]) Controlled with A1c of 6.5 Last eye exam was in 04/2018; he is due for one this year Counseled on Diabetic diet, my plate method, 756 minutes of moderate intensity exercise/week Keep blood sugar logs with fasting goals of 80-120 mg/dl, random of less than 180 and in the event of sugars less than 60 mg/dl or greater than 400 mg/dl please notify the clinic ASAP. It is recommended that you undergo annual eye exams and annual foot exams. Pneumonia vaccine is recommended. - POCT glucose (manual entry) - POCT glycosylated hemoglobin (Hb A1C) - metFORMIN (GLUCOPHAGE-XR) 500 MG 24 hr tablet; Take 1 tablet (500 mg total) by mouth daily with breakfast.  Dispense: 30 tablet; Refill: 6 - Basic Metabolic Panel  2. Pure hypercholesterolemia Controlled Low-cholesterol diet - atorvastatin (LIPITOR) 40 MG tablet; Take 1 tablet (40 mg total) by mouth daily.  Dispense: 30 tablet; Refill: 6  3. HTN (hypertension), benign Controlled with all medications Discontinue lisinopril/HCTZ due to symptomatic hypotension Diet control Counseled on blood pressure goal of less than 130/80, low-sodium, DASH diet, medication compliance, 150 minutes of moderate intensity exercise per week. Discussed medication compliance, adverse effects.   4. Smoker Spent 3 minutes counseling on cessation and he is willing to work on quitting - buPROPion (WELLBUTRIN SR) 100 MG 12 hr tablet; Take 1 tablet (100 mg total)  by mouth 2 (two) times daily.  Dispense: 60 tablet; Refill: 3  5. Lower urinary tract symptoms Stable - tamsulosin (FLOMAX) 0.4 MG CAPS capsule; Take 1 capsule (0.4 mg total) by mouth daily.  Dispense: 30 capsule; Refill: 6    Meds ordered this encounter  Medications  . buPROPion (WELLBUTRIN SR) 100 MG 12 hr tablet    Sig: Take 1 tablet (100 mg total) by mouth 2 (two) times daily.    Dispense:  60 tablet    Refill:  3  . tamsulosin (FLOMAX) 0.4 MG CAPS capsule    Sig: Take 1 capsule (0.4 mg total) by mouth daily.    Dispense:  30 capsule    Refill:  6  . atorvastatin (LIPITOR) 40 MG tablet    Sig: Take 1 tablet (40 mg total) by mouth daily.    Dispense:  30 tablet    Refill:  6    Disregard 20 mg  . metFORMIN (GLUCOPHAGE-XR) 500 MG 24 hr tablet    Sig: Take 1 tablet (500 mg total) by mouth daily with breakfast.    Dispense:  30 tablet    Refill:  6    Follow-up: Return in about 6 months (around 11/20/2019) for medical conditions.       Charlott Rakes, MD, FAAFP. [facility], [place]   05/20/2019, 2:24 PM

## 2019-05-21 LAB — BASIC METABOLIC PANEL
BUN/Creatinine Ratio: 9 (ref 9–20)
BUN: 13 mg/dL (ref 6–24)
CO2: 24 mmol/L (ref 20–29)
Calcium: 9.1 mg/dL (ref 8.7–10.2)
Chloride: 103 mmol/L (ref 96–106)
Creatinine, Ser: 1.43 mg/dL — ABNORMAL HIGH (ref 0.76–1.27)
GFR calc Af Amer: 63 mL/min/{1.73_m2} (ref >59)
GFR calc non Af Amer: 55 mL/min/{1.73_m2} — ABNORMAL LOW (ref >59)
Glucose: 72 mg/dL (ref 65–99)
Potassium: 4.7 mmol/L (ref 3.5–5.2)
Sodium: 141 mmol/L (ref 134–144)

## 2019-05-22 ENCOUNTER — Telehealth: Payer: Self-pay

## 2019-05-22 NOTE — Telephone Encounter (Signed)
Patient name and DOB has been verified Patient was informed of lab results. Patient had no questions.  

## 2019-05-22 NOTE — Telephone Encounter (Signed)
-----   Message from Charlott Rakes, MD sent at 05/21/2019  1:35 PM EDT ----- He has slightly abnormal kidney function.  Please advised to increase fluid intake and avoid medications like Aleve and ibuprofen.

## 2019-06-13 MED FILL — TAMSULOSIN HCL 0.4 MG CAP: 0.4 | 30 days supply | Qty: 30 | Fill #5

## 2019-06-13 MED FILL — ATORVASTATIN CALCIUM 40 MG: 40 | 30 days supply | Qty: 30 | Fill #5

## 2019-06-13 MED FILL — METFORMIN HCL ER 500 MG TB2: 500 | 30 days supply | Qty: 30 | Fill #5

## 2019-07-15 MED FILL — TAMSULOSIN HCL 0.4 MG CAP: 0.4 | 30 days supply | Qty: 30 | Fill #6

## 2019-07-15 MED FILL — METFORMIN HCL ER 500 MG TB2: 500 | 30 days supply | Qty: 30 | Fill #6

## 2019-07-15 MED FILL — ATORVASTATIN CALCIUM 40 MG: 40 | 30 days supply | Qty: 30 | Fill #6

## 2019-07-19 ENCOUNTER — Emergency Department (HOSPITAL_COMMUNITY)
Admission: EM | Admit: 2019-07-19 | Discharge: 2019-07-19 | Disposition: A | Discharge status: Home / Self Care | Payer: BC Managed Care – PPO | Attending: Emergency Medicine | Admitting: Emergency Medicine

## 2019-07-19 ENCOUNTER — Encounter (HOSPITAL_COMMUNITY): Payer: Self-pay

## 2019-07-19 ENCOUNTER — Other Ambulatory Visit: Payer: Self-pay

## 2019-07-19 ENCOUNTER — Emergency Department (HOSPITAL_COMMUNITY): Payer: BC Managed Care – PPO

## 2019-07-19 ENCOUNTER — Ambulatory Visit (HOSPITAL_COMMUNITY)
Admission: EM | Admit: 2019-07-19 | Discharge: 2019-07-19 | Disposition: A | Discharge status: Home / Self Care | Payer: BC Managed Care – PPO | Source: Home / Self Care

## 2019-07-19 DIAGNOSIS — Z7984 Long term (current) use of oral hypoglycemic drugs: Secondary | ICD-10-CM | POA: Insufficient documentation

## 2019-07-19 DIAGNOSIS — I10 Essential (primary) hypertension: Secondary | ICD-10-CM | POA: Insufficient documentation

## 2019-07-19 DIAGNOSIS — R319 Hematuria, unspecified: Secondary | ICD-10-CM | POA: Diagnosis not present

## 2019-07-19 DIAGNOSIS — Z85850 Personal history of malignant neoplasm of thyroid: Secondary | ICD-10-CM | POA: Insufficient documentation

## 2019-07-19 DIAGNOSIS — F17210 Nicotine dependence, cigarettes, uncomplicated: Secondary | ICD-10-CM | POA: Insufficient documentation

## 2019-07-19 DIAGNOSIS — E119 Type 2 diabetes mellitus without complications: Secondary | ICD-10-CM | POA: Insufficient documentation

## 2019-07-19 DIAGNOSIS — Z7982 Long term (current) use of aspirin: Secondary | ICD-10-CM | POA: Diagnosis not present

## 2019-07-19 DIAGNOSIS — R109 Unspecified abdominal pain: Secondary | ICD-10-CM

## 2019-07-19 DIAGNOSIS — Z79899 Other long term (current) drug therapy: Secondary | ICD-10-CM | POA: Insufficient documentation

## 2019-07-19 LAB — CBC WITH DIFFERENTIAL/PLATELET
Abs Immature Granulocytes: 0.03 10*3/uL (ref 0.00–0.07)
Basophils Absolute: 0.1 10*3/uL (ref 0.0–0.1)
Basophils Relative: 1 %
Eosinophils Absolute: 0.3 10*3/uL (ref 0.0–0.5)
Eosinophils Relative: 3 %
HCT: 44.5 % (ref 39.0–52.0)
Hemoglobin: 14.8 g/dL (ref 13.0–17.0)
Immature Granulocytes: 0 %
Lymphocytes Relative: 20 %
Lymphs Abs: 2.4 10*3/uL (ref 0.7–4.0)
MCH: 25.2 pg — ABNORMAL LOW (ref 26.0–34.0)
MCHC: 33.3 g/dL (ref 30.0–36.0)
MCV: 75.8 fL — ABNORMAL LOW (ref 80.0–100.0)
Monocytes Absolute: 0.8 10*3/uL (ref 0.1–1.0)
Monocytes Relative: 7 %
Neutro Abs: 8.2 10*3/uL — ABNORMAL HIGH (ref 1.7–7.7)
Neutrophils Relative %: 69 %
Platelets: 350 10*3/uL (ref 150–400)
RBC: 5.87 MIL/uL — ABNORMAL HIGH (ref 4.22–5.81)
RDW: 16.4 % — ABNORMAL HIGH (ref 11.5–15.5)
WBC: 11.8 10*3/uL — ABNORMAL HIGH (ref 4.0–10.5)
nRBC: 0 % (ref 0.0–0.2)

## 2019-07-19 LAB — COMPREHENSIVE METABOLIC PANEL
ALT: 29 U/L (ref 0–44)
AST: 26 U/L (ref 15–41)
Albumin: 4.3 g/dL (ref 3.5–5.0)
Alkaline Phosphatase: 87 U/L (ref 38–126)
Anion gap: 10 (ref 5–15)
BUN: 12 mg/dL (ref 6–20)
CO2: 23 mmol/L (ref 22–32)
Calcium: 8.9 mg/dL (ref 8.9–10.3)
Chloride: 99 mmol/L (ref 98–111)
Creatinine, Ser: 0.84 mg/dL (ref 0.61–1.24)
GFR calc Af Amer: >60 mL/min (ref >60)
GFR calc non Af Amer: >60 mL/min (ref >60)
Glucose, Bld: 92 mg/dL (ref 70–99)
Potassium: 3.8 mmol/L (ref 3.5–5.1)
Sodium: 132 mmol/L — ABNORMAL LOW (ref 135–145)
Total Bilirubin: 0.8 mg/dL (ref 0.3–1.2)
Total Protein: 7.1 g/dL (ref 6.5–8.1)

## 2019-07-19 LAB — POCT URINALYSIS DIP (DEVICE)
Bilirubin Urine: NEGATIVE
Glucose, UA: NEGATIVE mg/dL
Ketones, ur: NEGATIVE mg/dL
Leukocytes,Ua: NEGATIVE
Nitrite: NEGATIVE
Protein, ur: NEGATIVE mg/dL
Specific Gravity, Urine: 1.025 (ref 1.005–1.030)
Urobilinogen, UA: 0.2 mg/dL (ref 0.0–1.0)
pH: 6 (ref 5.0–8.0)

## 2019-07-19 MED ORDER — TRAMADOL HCL 50 MG PO TABS: 50.0000 mg | ORAL_TABLET | Freq: Two times a day (BID) | ORAL | 0 refills | Status: AC | PRN

## 2019-07-19 MED ORDER — KETOROLAC TROMETHAMINE 15 MG/ML IJ SOLN
15.0000 mg | Freq: Once | INTRAMUSCULAR | Status: AC
  Administered 2019-07-19: 18:00:00 15 mg via INTRAVENOUS
  Filled 2019-07-19: qty 1

## 2019-07-19 MED FILL — traMADol HCL 50 MG TABS: 50 | 3 days supply | Qty: 6 | Fill #0

## 2019-07-19 NOTE — ED Provider Notes (Signed)
[facility] EMERGENCY DEPARTMENT Provider Note   CSN: [id number] Arrival date & time: 07/19/19  1609     History   Chief Complaint Chief Complaint  Patient presents with  . Nephrolithiasis    HPI Dalton Arnold is a 53 y.o. male.     53 y.o male with a PMH of DM,HTN, Thyroid mass presents to the ED with a chief complaint of right flank pain x this morning.  Patient reports waking up with a sharp sensation to his right flank with radiation onto his abdomen.  He reports his pain was waxing and waning however now has become constant.  Patient reports the pain varies from a 7 to a 10.  Seen by urgent care earlier, was diagnosed with a kidney stone and given a prescription for tramadol, states he took 1 pill however his symptoms have not improved.  He denies any dysuria, hematuria, fevers.  He does not have any prior history of kidney stones, denies any vomiting or nausea.  The history is provided by the patient.    Past Medical History:  Diagnosis Date  . Diabetes mellitus without complication ([place])   . Diarrhea   . Hypertension   . Thyroid mass     Patient Active Problem List   Diagnosis Date Noted  . Hyperlipidemia 11/27/2018  . Obesity (BMI 30.0-34.9) 07/11/2017  . Diabetes mellitus type 2 in obese ([place]) 06/20/2016  . HTN (hypertension), benign 06/20/2016  . Thyroid cancer ([place]) 04/22/2016  . Thyroid adenoma 08/19/2014  . Illiteracy and low-level literacy 07/14/2014  . H. pylori duodenitis 07/10/2014  . Duodenitis 06/25/2014  . Acute pancreatitis 06/25/2014  . Alcohol use 06/25/2014  . Smoker 06/25/2014    Past Surgical History:  Procedure Laterality Date  . ESOPHAGOGASTRODUODENOSCOPY N/A 06/27/2014   Procedure: ESOPHAGOGASTRODUODENOSCOPY (EGD);  Surgeon: Beryle Beams, MD;  Location: [facility];  Service: Endoscopy;  Laterality: N/A;  . HERNIA REPAIR     x2  . NASAL FRACTURE SURGERY    . THYROIDECTOMY Left 08/19/2014   Procedure: LEFT  THYROID LOBECTOMY;  Surgeon: Rozetta Nunnery, MD;  Location: [facility];  Service: ENT;  Laterality: Left;        Home Medications    Prior to Admission medications   Medication Sig Start Date End Date Taking? Authorizing Provider  aspirin 325 MG tablet Take 325 mg by mouth daily.    [provider]  atorvastatin (LIPITOR) 40 MG tablet Take 1 tablet (40 mg total) by mouth daily. 05/20/19   Charlott Rakes, MD  Blood Glucose Monitoring Suppl (CONTOUR NEXT MONITOR) w/Device KIT 1 each by Does not apply route daily. Patient not taking: Reported on 11/27/2018 01/08/18   Charlott Rakes, MD  buPROPion [facility] SR) 100 MG 12 hr tablet Take 1 tablet (100 mg total) by mouth 2 (two) times daily. 05/20/19   Charlott Rakes, MD  glucose blood (CONTOUR NEXT TEST) test strip Use as instructed daily Patient not taking: Reported on 11/27/2018 01/08/18   Charlott Rakes, MD  hydrocortisone cream 0.5 % Apply 1 application topically 2 (two) times daily. Apply to trunk as needed  For itching 11/27/18   Charlott Rakes, MD  Lancets Thin MISC 1 each by Does not apply route daily. Patient not taking: Reported on 11/27/2018 01/08/18   Charlott Rakes, MD  meloxicam (MOBIC) 7.5 MG tablet Take 1 tablet (7.5 mg total) by mouth daily. Patient not taking: Reported on 11/27/2018 01/08/18   Charlott Rakes, MD  metFORMIN (GLUCOPHAGE-XR) 500  MG 24 hr tablet Take 1 tablet (500 mg total) by mouth daily with breakfast. 05/20/19   Charlott Rakes, MD  naproxen (NAPROSYN) 500 MG tablet Take 1 tablet (500 mg total) by mouth 2 (two) times daily. Patient not taking: Reported on 11/27/2018 01/31/18   Wieters, Madelynn Done C, PA-C  tamsulosin (FLOMAX) 0.4 MG CAPS capsule Take 1 capsule (0.4 mg total) by mouth daily. 05/20/19   Charlott Rakes, MD  terbinafine (LAMISIL AT) 1 % cream Apply 1 application topically 2 (two) times daily. Apply to feet 11/27/18   Charlott Rakes, MD  traMADol (ULTRAM) 50 MG tablet Take 1 tablet (50 mg total) by mouth  every 12 (twelve) hours as needed for up to 3 days. 07/19/19 07/22/19  Orvan July, NP    Family History Family History  Problem Relation Age of Onset  . Healthy Mother   . Healthy Father   . Thyroid disease Neg Hx   . Colon cancer Neg Hx     Social History Social History   Tobacco Use  . Smoking status: Current Every Day Smoker    Packs/day: 1.00    Types: Cigarettes  . Smokeless tobacco: Never Used  Substance Use Topics  . Alcohol use: Yes    Comment: occ.   . Drug use: No     Allergies   Patient has no known allergies.   Review of Systems Review of Systems  Constitutional: Negative for fever.  HENT: Negative for sore throat.   Respiratory: Negative for shortness of breath.   Cardiovascular: Negative for chest pain.  Gastrointestinal: Negative for abdominal pain, nausea and vomiting.  Genitourinary: Positive for flank pain (right). Negative for difficulty urinating.  Musculoskeletal: Negative for back pain.  Neurological: Negative for headaches.     Physical Exam Updated Vital Signs BP (!) 155/99 (BP Location: Right Arm)   Pulse 74   Temp 98.8 F (37.1 C) (Oral)   Resp 16   SpO2 97%   Physical Exam Vitals signs and nursing note reviewed.  Constitutional:      Appearance: He is well-developed.     Comments: Non-ill-appearing.  No diaphoresis on my exam.  HENT:     Head: Normocephalic and atraumatic.  Eyes:     General: No scleral icterus.    Pupils: Pupils are equal, round, and reactive to light.  Neck:     Musculoskeletal: Normal range of motion.  Cardiovascular:     Rate and Rhythm: Normal rate.     Heart sounds: Normal heart sounds.  Pulmonary:     Effort: Pulmonary effort is normal.     Breath sounds: Normal breath sounds. No wheezing.  Chest:     Chest wall: No tenderness.  Abdominal:     General: Bowel sounds are normal. There is no distension.     Palpations: Abdomen is soft.     Tenderness: There is no abdominal tenderness. There  is right CVA tenderness.     Comments: Mild CVA, no tenderness in abdominal region.  Musculoskeletal:        General: No tenderness or deformity.  Skin:    General: Skin is warm and dry.  Neurological:     Mental Status: He is alert and oriented to person, place, and time.      ED Treatments / Results  Labs (all labs ordered are listed, but only abnormal results are displayed) Labs Reviewed  CBC WITH DIFFERENTIAL/PLATELET - Abnormal; Notable for the following components:      Result Value  WBC 11.8 (*)    RBC 5.87 (*)    MCV 75.8 (*)    MCH 25.2 (*)    RDW 16.4 (*)    Neutro Abs 8.2 (*)    All other components within normal limits  COMPREHENSIVE METABOLIC PANEL - Abnormal; Notable for the following components:   Sodium 132 (*)    All other components within normal limits    EKG None  Radiology No results found.  Procedures Procedures (including critical care time)  Medications Ordered in ED Medications  ketorolac (TORADOL) 15 MG/ML injection 15 mg (15 mg Intravenous Given 07/19/19 1757)     Initial Impression / Assessment and Plan / ED Course  I have reviewed the triage vital signs and the nursing notes.  Pertinent labs & imaging results that were available during my care of the patient were reviewed by me and considered in my medical decision making (see chart for details).  Clinical Course as of Jul 19 1843  Fri Jul 19, 2019  6503 Creatinine: 0.84 [JS]    Clinical Course User Index [JS] Janeece Fitting, PA-C      Patient with no pertinent past medical history presents to the ED with complaints of right flank pain which began this morning around 3 AM, pain used to wax and wane however now has become constant.  He was seen in urgent care earlier, had a UA positive for hematuria.  Was sent home with a prescription for tramadol.  Patient reports no urinary symptoms such as dysuria, frequency, urgency.  A CT renal has been ordered to further evaluate patient's  pain along with rule out any hydronephrosis.  CBC without any leukocytosis, hemoglobin is within normal limits.  CMP showed a creatinine level within normal limits, patient was given 1 dose of Toradol via IV to help with his symptoms.  He is currently pending a CT renal.  Patient care signed out to Domenic Moras PA at shift change pending CT renal.  Suspect patient will likely go home after CT results return.  Of note, patient does have a prescription for tramadol.    Portions of this note were generated with [employer]. Dictation errors may occur despite best attempts at proofreading.  Final Clinical Impressions(s) / ED Diagnoses   Final diagnoses:  Right flank pain    ED Discharge Orders    None       Janeece Fitting, PA-C 07/19/19 1844    Tegeler, Gwenyth Allegra, MD 07/20/19 [id number]

## 2019-07-19 NOTE — ED Notes (Signed)
Patient Alert and oriented to baseline. Stable and ambulatory to baseline. Patient verbalized understanding of the discharge instructions.  Patient belongings were taken by the patient.   

## 2019-07-19 NOTE — ED Provider Notes (Signed)
[facility]    CSN: [id number] Arrival date & time: 07/19/19  1129      History   Chief Complaint Chief Complaint  Patient presents with  . Flank Pain    HPI Dalton Arnold is a 53 y.o. male.   Patient is a 53 year old male with past medical history of diabetes, diarrhea, hypertension, thyroid cancer, acute pancreatitis, alcohol use, daily smoker.  He presents today with right flank pain.  This started earlier this morning.  Symptoms been constant, waxing waning.  He has had some frequent urination associated.  Denies any fever, nausea or vomiting.  He took Aleve, Tylenol and aspirin with some relief.  He is rating pain 5 out of 10 currently.  No hematuria, penile discharge, rashes, testicle swelling or pain.  Denies any heavy lifting or falls or injuries.  ROS per HPI      Past Medical History:  Diagnosis Date  . Diabetes mellitus without complication ([place])   . Diarrhea   . Hypertension   . Thyroid mass     Patient Active Problem List   Diagnosis Date Noted  . Hyperlipidemia 11/27/2018  . Obesity (BMI 30.0-34.9) 07/11/2017  . Diabetes mellitus type 2 in obese ([place]) 06/20/2016  . HTN (hypertension), benign 06/20/2016  . Thyroid cancer ([place]) 04/22/2016  . Thyroid adenoma 08/19/2014  . Illiteracy and low-level literacy 07/14/2014  . H. pylori duodenitis 07/10/2014  . Duodenitis 06/25/2014  . Acute pancreatitis 06/25/2014  . Alcohol use 06/25/2014  . Smoker 06/25/2014    Past Surgical History:  Procedure Laterality Date  . ESOPHAGOGASTRODUODENOSCOPY N/A 06/27/2014   Procedure: ESOPHAGOGASTRODUODENOSCOPY (EGD);  Surgeon: Beryle Beams, MD;  Location: [facility];  Service: Endoscopy;  Laterality: N/A;  . HERNIA REPAIR     x2  . NASAL FRACTURE SURGERY    . THYROIDECTOMY Left 08/19/2014   Procedure: LEFT THYROID LOBECTOMY;  Surgeon: Rozetta Nunnery, MD;  Location: [facility];  Service: ENT;  Laterality: Left;       Home Medications     Prior to Admission medications   Medication Sig Start Date End Date Taking? Authorizing Provider  aspirin 325 MG tablet Take 325 mg by mouth daily.    [provider]  atorvastatin (LIPITOR) 40 MG tablet Take 1 tablet (40 mg total) by mouth daily. 05/20/19   Charlott Rakes, MD  Blood Glucose Monitoring Suppl (CONTOUR NEXT MONITOR) w/Device KIT 1 each by Does not apply route daily. Patient not taking: Reported on 11/27/2018 01/08/18   Charlott Rakes, MD  buPROPion [facility] SR) 100 MG 12 hr tablet Take 1 tablet (100 mg total) by mouth 2 (two) times daily. 05/20/19   Charlott Rakes, MD  glucose blood (CONTOUR NEXT TEST) test strip Use as instructed daily Patient not taking: Reported on 11/27/2018 01/08/18   Charlott Rakes, MD  hydrocortisone cream 0.5 % Apply 1 application topically 2 (two) times daily. Apply to trunk as needed  For itching 11/27/18   Charlott Rakes, MD  Lancets Thin MISC 1 each by Does not apply route daily. Patient not taking: Reported on 11/27/2018 01/08/18   Charlott Rakes, MD  meloxicam (MOBIC) 7.5 MG tablet Take 1 tablet (7.5 mg total) by mouth daily. Patient not taking: Reported on 11/27/2018 01/08/18   Charlott Rakes, MD  metFORMIN (GLUCOPHAGE-XR) 500 MG 24 hr tablet Take 1 tablet (500 mg total) by mouth daily with breakfast. 05/20/19   Charlott Rakes, MD  naproxen (NAPROSYN) 500 MG tablet Take 1 tablet (500 mg  total) by mouth 2 (two) times daily. Patient not taking: Reported on 11/27/2018 01/31/18   Wieters, Madelynn Done C, PA-C  tamsulosin (FLOMAX) 0.4 MG CAPS capsule Take 1 capsule (0.4 mg total) by mouth daily. 05/20/19   Charlott Rakes, MD  terbinafine (LAMISIL AT) 1 % cream Apply 1 application topically 2 (two) times daily. Apply to feet 11/27/18   Charlott Rakes, MD  traMADol (ULTRAM) 50 MG tablet Take 1 tablet (50 mg total) by mouth every 12 (twelve) hours as needed for up to 3 days. 07/19/19 07/22/19  Orvan July, NP    Family History Family History  Problem  Relation Age of Onset  . Healthy Mother   . Healthy Father   . Thyroid disease Neg Hx   . Colon cancer Neg Hx     Social History Social History   Tobacco Use  . Smoking status: Current Every Day Smoker    Packs/day: 1.00    Types: Cigarettes  . Smokeless tobacco: Never Used  Substance Use Topics  . Alcohol use: Yes    Comment: occ.   . Drug use: No     Allergies   Patient has no known allergies.   Review of Systems Review of Systems   Physical Exam Triage Vital Signs ED Triage Vitals  Enc Vitals Group     BP 07/19/19 1203 (!) 148/89     Pulse Rate 07/19/19 1203 67     Resp 07/19/19 1203 16     Temp 07/19/19 1203 98.3 F (36.8 C)     Temp Source 07/19/19 1203 Oral     SpO2 07/19/19 1203 100 %     Weight --      Height --      Head Circumference --      Peak Flow --      Pain Score 07/19/19 1201 5     Pain Loc --      Pain Edu? --      Excl. in [place]? --    No data found.  Updated Vital Signs BP (!) 148/89 (BP Location: Right Arm)   Pulse 67   Temp 98.3 F (36.8 C) (Oral)   Resp 16   SpO2 100%   Visual Acuity Right Eye Distance:   Left Eye Distance:   Bilateral Distance:    Right Eye Near:   Left Eye Near:    Bilateral Near:     Physical Exam Vitals signs and nursing note reviewed.  Constitutional:      General: He is not in acute distress.    Appearance: Normal appearance. He is not ill-appearing, toxic-appearing or diaphoretic.  HENT:     Head: Normocephalic and atraumatic.     Nose: Nose normal.  Eyes:     Conjunctiva/sclera: Conjunctivae normal.  Neck:     Musculoskeletal: Normal range of motion.  Cardiovascular:     Rate and Rhythm: Normal rate and regular rhythm.  Pulmonary:     Effort: Pulmonary effort is normal.     Breath sounds: Normal breath sounds.  Abdominal:     Tenderness: There is right CVA tenderness.  Musculoskeletal: Normal range of motion.  Skin:    General: Skin is warm and dry.     Findings: No rash.   Neurological:     General: No focal deficit present.     Mental Status: He is alert.  Psychiatric:        Mood and Affect: Mood normal.      UC Treatments /  Results  Labs (all labs ordered are listed, but only abnormal results are displayed) Labs Reviewed  POCT URINALYSIS DIP (DEVICE) - Abnormal; Notable for the following components:      Result Value   Hgb urine dipstick TRACE (*)    All other components within normal limits    EKG   Radiology No results found.  Procedures Procedures (including critical care time)  Medications Ordered in UC Medications - No data to display  Initial Impression / Assessment and Plan / UC Course  I have reviewed the triage vital signs and the nursing notes.  Pertinent labs & imaging results that were available during my care of the patient were reviewed by me and considered in my medical decision making (see chart for details).     Right flank pain- symptoms started this am Trace Hgb in urine no leukocytes or signs of infection. Symptoms and exam consistent with nephrolithiasis. Other differentials include musculoskeletal pain We will have him continue the Aleve and Tylenol as needed.  Prescribed tramadol for more severe pain to use as needed. Recommended pushing fluids and straining his urine to see if the kidney stone passes. Strict return precautions that if his symptoms continue or worsen he will need to go to the ER for CT scan. Patient understanding and agree.   Final Clinical Impressions(s) / UC Diagnoses   Final diagnoses:  Right flank pain     Discharge Instructions     We believe this may be a kidney stone.  You had some blood in the urine and having pain consistent with this.  You can keep taking the medicine OTC for pain as you have been  I will give you something a little stronger to use as needed  If your pain worsens then you will need to go to the ER If this is a stone it should pass on its own within a few  days We will give ou a strainer and you can strain the urine to see if it passes.     ED Prescriptions    Medication Sig Dispense Auth. Provider   traMADol (ULTRAM) 50 MG tablet Take 1 tablet (50 mg total) by mouth every 12 (twelve) hours as needed for up to 3 days. 6 tablet Javares Kaufhold A, NP     I have reviewed the PDMP during this encounter.   Orvan July, NP 07/19/19 1316

## 2019-07-19 NOTE — Discharge Instructions (Addendum)
Your laboratory results were within normal limits today.  Your CT renal showed no acute abnormality.  No evidence of kidney stone.   You are given a prescription for pain medication by the urgent care, please continue taking this medication as needed for severe pain.  You may continue to increase your liquids with Gatorade or water.

## 2019-07-19 NOTE — ED Triage Notes (Signed)
Patient presents to Urgent Care with complaints of right flank pain since early this morning. Patient reports he took some otc pain meds with some relief. Pt endorses frequent urination.

## 2019-07-19 NOTE — Discharge Instructions (Addendum)
We believe this may be a kidney stone.  You had some blood in the urine and having pain consistent with this.  You can keep taking the medicine OTC for pain as you have been  I will give you something a little stronger to use as needed  If your pain worsens then you will need to go to the ER If this is a stone it should pass on its own within a few days We will give ou a strainer and you can strain the urine to see if it passes.

## 2019-07-19 NOTE — ED Triage Notes (Signed)
Pt dx with kidney stone at urgent care this morning and was given tramadol. Took one without relief and is here for continued pain. No nausea or vomiting.

## 2019-08-14 MED FILL — ATORVASTATIN 40 MG TABLET: 40 | 30 days supply | Qty: 30 | Fill #0

## 2019-08-14 MED FILL — TAMSULOSIN HCL 0.4 MG CAP: 0.4 | 30 days supply | Qty: 30 | Fill #0

## 2019-08-14 MED FILL — METFORMIN HCL ER 500 MG TB2: 500 | 30 days supply | Qty: 30 | Fill #0

## 2019-09-12 MED FILL — TAMSULOSIN HCL 0.4 MG CAP: 0.4 | 30 days supply | Qty: 30 | Fill #1

## 2019-09-12 MED FILL — METFORMIN HCL ER 500 MG TB2: 500 | 30 days supply | Qty: 30 | Fill #1

## 2019-09-12 MED FILL — ATORVASTATIN 40 MG TABLET: 40 | 30 days supply | Qty: 30 | Fill #1

## 2019-10-14 MED FILL — ATORVASTATIN CALCIUM 40 MG: 40 | 30 days supply | Qty: 30 | Fill #2

## 2019-10-14 MED FILL — METFORMIN HCL ER 500 MG TAB: 500 | 30 days supply | Qty: 30 | Fill #2

## 2019-10-14 MED FILL — TAMSULOSIN HCL 0.4 MG CAP: 0.4 | 30 days supply | Qty: 30 | Fill #2

## 2019-11-07 ENCOUNTER — Ambulatory Visit: Payer: 59 | Attending: Family Medicine | Admitting: Pharmacist

## 2019-11-07 ENCOUNTER — Other Ambulatory Visit: Payer: Self-pay

## 2019-11-07 DIAGNOSIS — Z23 Encounter for immunization: Secondary | ICD-10-CM

## 2019-11-07 NOTE — Progress Notes (Signed)
Patient presents for vaccination against influenza per orders of Dr. Newlin. Consent given. Counseling provided. No contraindications exists. Vaccine administered without incident.   

## 2019-11-20 MED FILL — metFORMIN HCL ER 500 MG TB2: 500 | 30 days supply | Qty: 30 | Fill #3

## 2019-11-20 MED FILL — ATORVASTATIN CALCIUM 40 MG: 40 | 30 days supply | Qty: 30 | Fill #3

## 2019-11-20 MED FILL — TAMSULOSIN HCL 0.4 MG CAP: 0.4 | 30 days supply | Qty: 30 | Fill #3

## 2019-12-24 MED FILL — TAMSULOSIN HCL 0.4 MG CAP: 0.4 | 30 days supply | Qty: 30 | Fill #4

## 2019-12-24 MED FILL — ATORVASTATIN CALCIUM 40 MG: 40 | 30 days supply | Qty: 30 | Fill #4

## 2019-12-24 MED FILL — metFORMIN HCL ER 500 MG TB2: 500 | 30 days supply | Qty: 30 | Fill #4

## 2020-01-13 ENCOUNTER — Encounter: Payer: Self-pay | Admitting: Family Medicine

## 2020-01-13 ENCOUNTER — Other Ambulatory Visit: Payer: Self-pay

## 2020-01-13 ENCOUNTER — Ambulatory Visit: Payer: 59 | Attending: Family Medicine | Admitting: Family Medicine

## 2020-01-13 ENCOUNTER — Other Ambulatory Visit: Payer: Self-pay | Admitting: Family Medicine

## 2020-01-13 VITALS — BP 133/71 | HR 83 | Ht 67.0 in | Wt 208.0 lb

## 2020-01-13 DIAGNOSIS — E785 Hyperlipidemia, unspecified: Secondary | ICD-10-CM | POA: Diagnosis not present

## 2020-01-13 DIAGNOSIS — E1169 Type 2 diabetes mellitus with other specified complication: Secondary | ICD-10-CM | POA: Diagnosis not present

## 2020-01-13 DIAGNOSIS — I10 Essential (primary) hypertension: Secondary | ICD-10-CM

## 2020-01-13 DIAGNOSIS — E669 Obesity, unspecified: Secondary | ICD-10-CM | POA: Diagnosis not present

## 2020-01-13 DIAGNOSIS — R3989 Other symptoms and signs involving the genitourinary system: Secondary | ICD-10-CM | POA: Diagnosis not present

## 2020-01-13 DIAGNOSIS — R399 Unspecified symptoms and signs involving the genitourinary system: Secondary | ICD-10-CM

## 2020-01-13 LAB — GLUCOSE, POCT (MANUAL RESULT ENTRY): POC Glucose: 103 mg/dl — AB (ref 70–99)

## 2020-01-13 LAB — POCT GLYCOSYLATED HEMOGLOBIN (HGB A1C): HbA1c, POC (controlled diabetic range): 6.8 % (ref 0.0–7.0)

## 2020-01-13 MED ORDER — TAMSULOSIN HCL 0.4 MG PO CAPS: 0.4000 mg | ORAL_CAPSULE | Freq: Every day | ORAL | 6 refills | Status: DC

## 2020-01-13 MED ORDER — ATORVASTATIN CALCIUM 40 MG PO TABS: 40.0000 mg | ORAL_TABLET | Freq: Every day | ORAL | 6 refills | Status: DC

## 2020-01-13 MED ORDER — LISINOPRIL 10 MG PO TABS: 10.0000 mg | ORAL_TABLET | Freq: Every day | ORAL | 3 refills | Status: DC

## 2020-01-13 MED ORDER — METFORMIN HCL ER 500 MG PO TB24: 500.0000 mg | ORAL_TABLET | Freq: Every day | ORAL | 6 refills | Status: DC

## 2020-01-13 MED FILL — LISINOPRIL 10 MG TABS: 10 | 30 days supply | Qty: 30 | Fill #0

## 2020-01-13 NOTE — Progress Notes (Signed)
Subjective:  Patient ID: Dalton Arnold, male    DOB: 11-29-1965  Age: 54 y.o. MRN: [id number]  CC: Diabetes   HPI Dalton Arnold is a 54 year old male with history of type 2 diabetes mellitus (A1c 6.8), hypertension, thyroid adenocarcinoma diagnosed in 08/2014 (status post left lobectomy, last seen by endocrine in 04/2016 and declined complete lobectomy and radioactive iodine therapy) who presents today for a follow-up visit.   He went to donate blood and was told his BP was elevated and so he restarted his Lisinopril/HCTZ 20/47m which he had previously discontinued. With regards to his diabetes mellitus he is doing well on Metformin and he is also on a statin. He has no additional concerns at this time. Continues to smoke 1 pack of cigarettes per day and was placed on Wellbutrin for smoking cessation however he discontinued this. He is on Flomax for ongoing neurogenic symptoms however he has not noticed any improvement as he continues to have nocturia up to 3-4 times at night and urinary frequency during the daytime.  Denies presence of hematuria or dysuria.  Past Medical History:  Diagnosis Date  . Diabetes mellitus without complication ([place]   . Diarrhea   . Hypertension   . Thyroid mass     Past Surgical History:  Procedure Laterality Date  . ESOPHAGOGASTRODUODENOSCOPY N/A 06/27/2014   Procedure: ESOPHAGOGASTRODUODENOSCOPY (EGD);  Surgeon: PBeryle Beams MD;  [facility];  Service: Endoscopy;  Laterality: N/A;  . HERNIA REPAIR     x2  . NASAL FRACTURE SURGERY    . THYROIDECTOMY Left 08/19/2014   Procedure: LEFT THYROID LOBECTOMY;  Surgeon: CRozetta Nunnery MD;  [facility]: [facility];  Service: ENT;  Laterality: Left;    Family History  Problem Relation Age of Onset  . Healthy Mother   . Healthy Father   . Thyroid disease Neg Hx   . Colon cancer Neg Hx     No Known Allergies  Outpatient Medications Prior to Visit  Medication Sig Dispense Refill  .  atorvastatin (LIPITOR) 40 MG tablet Take 1 tablet (40 mg total) by mouth daily. 30 tablet 6  . buPROPion (WELLBUTRIN SR) 100 MG 12 hr tablet Take 1 tablet (100 mg total) by mouth 2 (two) times daily. 60 tablet 3  . metFORMIN (GLUCOPHAGE-XR) 500 MG 24 hr tablet Take 1 tablet (500 mg total) by mouth daily with breakfast. 30 tablet 6  . tamsulosin (FLOMAX) 0.4 MG CAPS capsule Take 1 capsule (0.4 mg total) by mouth daily. 30 capsule 6  . aspirin 325 MG tablet Take 325 mg by mouth daily.    . Blood Glucose Monitoring Suppl (CONTOUR NEXT MONITOR) w/Device KIT 1 each by Does not apply route daily. (Patient not taking: Reported on 11/27/2018) 1 kit 0  . glucose blood (CONTOUR NEXT TEST) test strip Use as instructed daily (Patient not taking: Reported on 11/27/2018) 30 each 12  . hydrocortisone cream 0.5 % Apply 1 application topically 2 (two) times daily. Apply to trunk as needed  For itching (Patient not taking: Reported on 01/13/2020) 56 g 1  . Lancets Thin MISC 1 each by Does not apply route daily. (Patient not taking: Reported on 11/27/2018) 30 each 12  . meloxicam (MOBIC) 7.5 MG tablet Take 1 tablet (7.5 mg total) by mouth daily. (Patient not taking: Reported on 11/27/2018) 30 tablet 1  . naproxen (NAPROSYN) 500 MG tablet Take 1 tablet (500 mg total) by mouth 2 (two) times daily. (Patient not taking: Reported on  11/27/2018) 30 tablet 0  . terbinafine (LAMISIL AT) 1 % cream Apply 1 application topically 2 (two) times daily. Apply to feet (Patient not taking: Reported on 01/13/2020) 42 g 3   Facility-Administered Medications Prior to Visit  Medication Dose Route Frequency Provider Last Rate Last Admin  . 0.9 %  sodium chloride infusion  500 mL Intravenous Once Nelida Meuse III, MD         ROS Review of Systems  Constitutional: Negative for activity change and appetite change.  HENT: Negative for sinus pressure and sore throat.   Eyes: Negative for visual disturbance.  Respiratory: Negative for cough,  chest tightness and shortness of breath.   Cardiovascular: Negative for chest pain and leg swelling.  Gastrointestinal: Negative for abdominal distention, abdominal pain, constipation and diarrhea.  Endocrine: Negative.   Genitourinary: Positive for frequency. Negative for dysuria.  Musculoskeletal: Negative for joint swelling and myalgias.  Skin: Negative for rash.  Allergic/Immunologic: Negative.   Neurological: Negative for weakness, light-headedness and numbness.  Psychiatric/Behavioral: Negative for dysphoric mood and suicidal ideas.    Objective:  BP 133/71   Pulse 83   Ht 5' 7"  (1.702 m)   Wt 208 lb (94.3 kg)   SpO2 98%   BMI 32.58 kg/m   BP/Weight 01/13/2020 07/19/2019 66/03/3015  Systolic BP 010 932 355  Diastolic BP 71 98 89  Wt. (Lbs) 208 - -  BMI 32.58 - -      Physical Exam Constitutional:      Appearance: He is well-developed.  Neck:     Vascular: No JVD.  Cardiovascular:     Rate and Rhythm: Normal rate.     Heart sounds: Normal heart sounds. No murmur.  Pulmonary:     Effort: Pulmonary effort is normal.     Breath sounds: Normal breath sounds. No wheezing or rales.  Chest:     Chest wall: No tenderness.  Abdominal:     General: Bowel sounds are normal. There is no distension.     Palpations: Abdomen is soft. There is no mass.     Tenderness: There is no abdominal tenderness.  Musculoskeletal:        General: Normal range of motion.     Right lower leg: No edema.     Left lower leg: No edema.  Neurological:     Mental Status: He is alert and oriented to person, place, and time.  Psychiatric:        Mood and Affect: Mood normal.     CMP Latest Ref Rng & Units 07/19/2019 05/20/2019 11/27/2018  Glucose 70 - 99 mg/dL 92 72 97  BUN 6 - 20 mg/dL 12 13 11   Creatinine 0.61 - 1.24 mg/dL 0.84 1.43(H) 0.97  Sodium 135 - 145 mmol/L 132(L) 141 139  Potassium 3.5 - 5.1 mmol/L 3.8 4.7 5.0  Chloride 98 - 111 mmol/L 99 103 104  CO2 22 - 32 mmol/L 23 24 22     Calcium 8.9 - 10.3 mg/dL 8.9 9.1 9.2  Total Protein 6.5 - 8.1 g/dL 7.1 - 7.2  Total Bilirubin 0.3 - 1.2 mg/dL 0.8 - 0.2  Alkaline Phos 38 - 126 U/L 87 - 97  AST 15 - 41 U/L 26 - 23  ALT 0 - 44 U/L 29 - 28    Lipid Panel     Component Value Date/Time   CHOL 226 (H) 11/27/2018 1133   TRIG 172 (H) 11/27/2018 1133   HDL 39 (L) 11/27/2018 1133   CHOLHDL  5.8 (H) 11/27/2018 1133   CHOLHDL 6.2 (H) 04/11/2016 1009   VLDL 30 04/11/2016 1009   LDLCALC 153 (H) 11/27/2018 1133    CBC    Component Value Date/Time   WBC 11.8 (H) 07/19/2019 1705   RBC 5.87 (H) 07/19/2019 1705   HGB 14.8 07/19/2019 1705   HCT 44.5 07/19/2019 1705   PLT 350 07/19/2019 1705   MCV 75.8 (L) 07/19/2019 1705   MCH 25.2 (L) 07/19/2019 1705   MCHC 33.3 07/19/2019 1705   RDW 16.4 (H) 07/19/2019 1705   LYMPHSABS 2.4 07/19/2019 1705   MONOABS 0.8 07/19/2019 1705   EOSABS 0.3 07/19/2019 1705   BASOSABS 0.1 07/19/2019 1705    Lab Results  Component Value Date   HGBA1C 6.8 01/13/2020    Assessment & Plan:   1. Diabetes mellitus type 2 in obese ([place]) Controlled with A1c of 6.8 Continue current management Counseled on Diabetic diet, my plate method, 034 minutes of moderate intensity exercise/week Blood sugar logs with fasting goals of 80-120 mg/dl, random of less than 180 and in the event of sugars less than 60 mg/dl or greater than 400 mg/dl encouraged to notify the clinic. Advised on the need for annual eye exams, annual foot exams, Pneumonia vaccine. - POCT glucose (manual entry) - POCT glycosylated hemoglobin (Hb A1C) - metFORMIN (GLUCOPHAGE-XR) 500 MG 24 hr tablet; Take 1 tablet (500 mg total) by mouth daily with breakfast.  Dispense: 30 tablet; Refill: 6 - CMP14+EGFR; Future - Microalbumin / creatinine urine ratio; Future  2. Lower urinary tract symptoms Uncontrolled Symptoms could be related to his resumption of his diuretic antihypertensive which I have discontinued.  And will reassess for  improvement at next visit Continue Flomax; consider addition of Proscar if symptoms persist - tamsulosin (FLOMAX) 0.4 MG CAPS capsule; Take 1 capsule (0.4 mg total) by mouth daily.  Dispense: 30 capsule; Refill: 6  3. Hyperlipidemia associated with type 2 diabetes mellitus (HCC) Stable Low-cholesterol diet Lipid panel ordered - atorvastatin (LIPITOR) 40 MG tablet; Take 1 tablet (40 mg total) by mouth daily.  Dispense: 30 tablet; Refill: 6 - Lipid panel; Future  4. HTN (hypertension), benign We will resume lisinopril rather than lisinopril/HCTZ due to diuretic effect of the latter Counseled on blood pressure goal of less than 130/80, low-sodium, DASH diet, medication compliance, 150 minutes of moderate intensity exercise per week. Discussed medication compliance, adverse effects. - lisinopril (ZESTRIL) 10 MG tablet; Take 1 tablet (10 mg total) by mouth daily.  Dispense: 90 tablet; Refill: 3   Return in about 6 months (around 07/14/2020) for chronic medical conditions.     Charlott Rakes, MD, FAAFP. [facility] [place], [place]   01/13/2020, 3:08 PM

## 2020-01-13 NOTE — Patient Instructions (Signed)

## 2020-01-13 NOTE — Progress Notes (Signed)
Patient is concerned about his blood pressure.

## 2020-01-14 ENCOUNTER — Ambulatory Visit: Payer: 59 | Attending: Family Medicine

## 2020-01-14 DIAGNOSIS — E785 Hyperlipidemia, unspecified: Secondary | ICD-10-CM

## 2020-01-14 DIAGNOSIS — E669 Obesity, unspecified: Secondary | ICD-10-CM

## 2020-01-14 DIAGNOSIS — E1169 Type 2 diabetes mellitus with other specified complication: Secondary | ICD-10-CM

## 2020-01-15 LAB — CMP14+EGFR
ALT: 33 IU/L (ref 0–44)
AST: 24 IU/L (ref 0–40)
Albumin/Globulin Ratio: 1.7 (ref 1.2–2.2)
Albumin: 4.4 g/dL (ref 3.8–4.9)
Alkaline Phosphatase: 109 IU/L (ref 39–117)
BUN/Creatinine Ratio: 14 (ref 9–20)
BUN: 13 mg/dL (ref 6–24)
Bilirubin Total: <0.2 mg/dL (ref 0.0–1.2)
CO2: 21 mmol/L (ref 20–29)
Calcium: 9.4 mg/dL (ref 8.7–10.2)
Chloride: 104 mmol/L (ref 96–106)
Creatinine, Ser: 0.92 mg/dL (ref 0.76–1.27)
GFR calc Af Amer: 108 mL/min/{1.73_m2} (ref >59)
GFR calc non Af Amer: 93 mL/min/{1.73_m2} (ref >59)
Globulin, Total: 2.6 g/dL (ref 1.5–4.5)
Glucose: 113 mg/dL — ABNORMAL HIGH (ref 65–99)
Potassium: 5 mmol/L (ref 3.5–5.2)
Sodium: 140 mmol/L (ref 134–144)
Total Protein: 7 g/dL (ref 6.0–8.5)

## 2020-01-15 LAB — LIPID PANEL
Chol/HDL Ratio: 4.3 ratio (ref 0.0–5.0)
Cholesterol, Total: 137 mg/dL (ref 100–199)
HDL: 32 mg/dL — ABNORMAL LOW (ref >39)
LDL Chol Calc (NIH): 80 mg/dL (ref 0–99)
Triglycerides: 138 mg/dL (ref 0–149)
VLDL Cholesterol Cal: 25 mg/dL (ref 5–40)

## 2020-01-15 LAB — MICROALBUMIN / CREATININE URINE RATIO
Creatinine, Urine: 181.5 mg/dL
Microalb/Creat Ratio: 16 mg/g creat (ref 0–29)
Microalbumin, Urine: 28.8 ug/mL

## 2020-01-17 ENCOUNTER — Telehealth: Payer: Self-pay

## 2020-01-17 NOTE — Telephone Encounter (Signed)
-----   Message from Charlott Rakes, MD sent at 01/16/2020  1:11 PM EDT ----- Please inform the patient that labs are normal. Thank you.

## 2020-01-17 NOTE — Telephone Encounter (Signed)
Patient was called and a voicemail was left informing patient to return phone call for lab results. 

## 2020-01-23 ENCOUNTER — Telehealth: Payer: Self-pay

## 2020-01-23 MED FILL — ATORVASTATIN CALCIUM 40 MG: 40 | 30 days supply | Qty: 30 | Fill #5

## 2020-01-23 MED FILL — metFORMIN HCL ER 500 MG TB2: 500 | 30 days supply | Qty: 30 | Fill #5

## 2020-01-23 MED FILL — TAMSULOSIN HCL 0.4 MG CAP: 0.4 | 30 days supply | Qty: 30 | Fill #5

## 2020-01-23 NOTE — Telephone Encounter (Signed)
-----   Message from Charlott Rakes, MD sent at 01/16/2020  1:11 PM EDT ----- Please inform the patient that labs are normal. Thank you.

## 2020-01-23 NOTE — Telephone Encounter (Signed)
Patient name and DOB has been verified Patient was informed of lab results. Patient had no questions.  

## 2020-02-24 MED FILL — ATORVASTATIN CALCIUM 40 MG: 40 | 30 days supply | Qty: 30 | Fill #6

## 2020-02-24 MED FILL — TAMSULOSIN HCL 0.4 MG CAP: 0.4 | 30 days supply | Qty: 30 | Fill #6

## 2020-02-24 MED FILL — metFORMIN HCL ER 500 MG TB2: 500 | 30 days supply | Qty: 30 | Fill #6

## 2020-03-16 MED FILL — LISINOPRIL 10 MG TABS: 10 | 30 days supply | Qty: 30 | Fill #2

## 2020-03-17 ENCOUNTER — Ambulatory Visit (HOSPITAL_COMMUNITY)
Admission: EM | Admit: 2020-03-17 | Discharge: 2020-03-17 | Disposition: A | Discharge status: Home / Self Care | Payer: 59 | Attending: Physician Assistant | Admitting: Physician Assistant

## 2020-03-17 ENCOUNTER — Other Ambulatory Visit: Payer: Self-pay

## 2020-03-17 ENCOUNTER — Encounter (HOSPITAL_COMMUNITY): Payer: Self-pay

## 2020-03-17 DIAGNOSIS — L089 Local infection of the skin and subcutaneous tissue, unspecified: Secondary | ICD-10-CM | POA: Diagnosis not present

## 2020-03-17 DIAGNOSIS — S61412A Laceration without foreign body of left hand, initial encounter: Secondary | ICD-10-CM

## 2020-03-17 DIAGNOSIS — Z23 Encounter for immunization: Secondary | ICD-10-CM | POA: Diagnosis not present

## 2020-03-17 DIAGNOSIS — T148XXA Other injury of unspecified body region, initial encounter: Secondary | ICD-10-CM | POA: Diagnosis not present

## 2020-03-17 MED ORDER — TETANUS-DIPHTH-ACELL PERTUSSIS 5-2.5-18.5 LF-MCG/0.5 IM SUSP
0.5000 mL | Freq: Once | INTRAMUSCULAR | Status: AC
  Administered 2020-03-17: 0.5 mL via INTRAMUSCULAR

## 2020-03-17 MED ORDER — CEPHALEXIN 500 MG PO CAPS: 500.0000 mg | ORAL_CAPSULE | Freq: Four times a day (QID) | ORAL | 0 refills | Status: DC

## 2020-03-17 MED ORDER — TETANUS-DIPHTH-ACELL PERTUSSIS 5-2.5-18.5 LF-MCG/0.5 IM SUSP
INTRAMUSCULAR | Status: AC
  Filled 2020-03-17: qty 0.5

## 2020-03-17 MED FILL — CEPHALEXIN 500 MG CAPSULE: 500 | 5 days supply | Qty: 20 | Fill #0

## 2020-03-17 NOTE — ED Provider Notes (Signed)
[place]    CSN: [id number] Arrival date & time: 03/17/20  1002      History   Chief Complaint Chief Complaint  Patient presents with  . Laceration    HPI Dalton Arnold is a 54 y.o. male.   Patient presents for evaluation of laceration on left hand. Reports this occurred yesterday. He was working on a car when he ran his hand into a piece of glass. He reports the car part was coming from junk carts was dirty. He reports he" urinated on the wound to stop the bleeding" and this seemed to work. He has bandaged it however since then he has noticed some swelling and increased pain. He reports chills last night. He reports he is able to move his hand without much issue. He has not noticed any discharge from the wound. He is unsure when his last tetanus was.     Past Medical History:  Diagnosis Date  . Diabetes mellitus without complication ([place])   . Diarrhea   . Hypertension   . Thyroid mass     Patient Active Problem List   Diagnosis Date Noted  . Hyperlipidemia 11/27/2018  . Obesity (BMI 30.0-34.9) 07/11/2017  . Diabetes mellitus type 2 in obese ([place]) 06/20/2016  . HTN (hypertension), benign 06/20/2016  . Thyroid cancer ([place]) 04/22/2016  . Thyroid adenoma 08/19/2014  . Illiteracy and low-level literacy 07/14/2014  . H. pylori duodenitis 07/10/2014  . Duodenitis 06/25/2014  . Acute pancreatitis 06/25/2014  . Alcohol use 06/25/2014  . Smoker 06/25/2014    Past Surgical History:  Procedure Laterality Date  . ESOPHAGOGASTRODUODENOSCOPY N/A 06/27/2014   Procedure: ESOPHAGOGASTRODUODENOSCOPY (EGD);  Surgeon: Beryle Beams, MD;  [facility];  Service: Endoscopy;  Laterality: N/A;  . HERNIA REPAIR     x2  . NASAL FRACTURE SURGERY    . THYROIDECTOMY Left 08/19/2014   Procedure: LEFT THYROID LOBECTOMY;  Surgeon: Rozetta Nunnery, MD;  Location: [facility];  Service: ENT;  Laterality: Left;       Home Medications    Prior to Admission  medications   Medication Sig Start Date End Date Taking? Authorizing Provider  aspirin 325 MG tablet Take 325 mg by mouth daily.    [provider]  atorvastatin (LIPITOR) 40 MG tablet Take 1 tablet (40 mg total) by mouth daily. 01/13/20   Charlott Rakes, MD  Blood Glucose Monitoring Suppl (CONTOUR NEXT MONITOR) w/Device KIT 1 each by Does not apply route daily. Patient not taking: Reported on 11/27/2018 01/08/18   Charlott Rakes, MD  buPROPion [facility] SR) 100 MG 12 hr tablet Take 1 tablet (100 mg total) by mouth 2 (two) times daily. 05/20/19   Charlott Rakes, MD  cephALEXin (KEFLEX) 500 MG capsule Take 1 capsule (500 mg total) by mouth 4 (four) times daily. 03/17/20   Katee Wentland, Marguerita Beards, PA-C  glucose blood (CONTOUR NEXT TEST) test strip Use as instructed daily Patient not taking: Reported on 11/27/2018 01/08/18   Charlott Rakes, MD  hydrocortisone cream 0.5 % Apply 1 application topically 2 (two) times daily. Apply to trunk as needed  For itching Patient not taking: Reported on 01/13/2020 11/27/18   Charlott Rakes, MD  Lancets Thin MISC 1 each by Does not apply route daily. Patient not taking: Reported on 11/27/2018 01/08/18   Charlott Rakes, MD  lisinopril (ZESTRIL) 10 MG tablet Take 1 tablet (10 mg total) by mouth daily. 01/13/20   Charlott Rakes, MD  meloxicam (MOBIC) 7.5 MG tablet Take  1 tablet (7.5 mg total) by mouth daily. Patient not taking: Reported on 11/27/2018 01/08/18   Charlott Rakes, MD  metFORMIN (GLUCOPHAGE-XR) 500 MG 24 hr tablet Take 1 tablet (500 mg total) by mouth daily with breakfast. 01/13/20   Charlott Rakes, MD  naproxen (NAPROSYN) 500 MG tablet Take 1 tablet (500 mg total) by mouth 2 (two) times daily. Patient not taking: Reported on 11/27/2018 01/31/18   Wieters, Madelynn Done C, PA-C  tamsulosin (FLOMAX) 0.4 MG CAPS capsule Take 1 capsule (0.4 mg total) by mouth daily. 01/13/20   Charlott Rakes, MD  terbinafine (LAMISIL AT) 1 % cream Apply 1 application topically 2 (two) times  daily. Apply to feet Patient not taking: Reported on 01/13/2020 11/27/18   Charlott Rakes, MD    Family History Family History  Problem Relation Age of Onset  . Healthy Mother   . Healthy Father   . Thyroid disease Neg Hx   . Colon cancer Neg Hx     Social History Social History   Tobacco Use  . Smoking status: Current Every Day Smoker    Packs/day: 1.00    Types: Cigarettes  . Smokeless tobacco: Never Used  Vaping Use  . Vaping Use: Never used  Substance Use Topics  . Alcohol use: Yes    Comment: occ.   . Drug use: No     Allergies   Patient has no known allergies.   Review of Systems Review of Systems   Physical Exam Triage Vital Signs ED Triage Vitals  Enc Vitals Group     BP 03/17/20 1048 (!) 141/96     Pulse Rate 03/17/20 1048 64     Resp 03/17/20 1048 19     Temp 03/17/20 1048 98.7 F (37.1 C)     Temp Source 03/17/20 1048 Oral     SpO2 03/17/20 1048 98 %     Weight --      Height --      Head Circumference --      Peak Flow --      Pain Score 03/17/20 1047 2     Pain Loc --      Pain Edu? --      Excl. in [place]? --    No data found.  Updated Vital Signs BP (!) 141/96 (BP Location: Right Arm)   Pulse 64   Temp 98.7 F (37.1 C) (Oral)   Resp 19   SpO2 98%   Visual Acuity Right Eye Distance:   Left Eye Distance:   Bilateral Distance:    Right Eye Near:   Left Eye Near:    Bilateral Near:     Physical Exam Vitals and nursing note reviewed.  Cardiovascular:     Rate and Rhythm: Normal rate.  Pulmonary:     Effort: Pulmonary effort is normal. No respiratory distress.  Musculoskeletal:     Comments: Just proximal to the fifth MCP joint on the left hand there is a 1 cm laceration. Approximately 3 to 5 mm deep. There is slight area of swelling and erythema surrounding this. Mildly tender to touch. No purulent discharge. Patient able to make a fist without issue. No joint tenderness.  Neurological:     Mental Status: He is alert.         UC Treatments / Results  Labs (all labs ordered are listed, but only abnormal results are displayed) Labs Reviewed - No data to display  EKG   Radiology No results found.  Procedures Procedures (including  critical care time)  Medications Ordered in UC Medications  Tdap (BOOSTRIX) injection 0.5 mL (0.5 mLs Intramuscular Given 03/17/20 1153)    Initial Impression / Assessment and Plan / UC Course  I have reviewed the triage vital signs and the nursing notes.  Pertinent labs & imaging results that were available during my care of the patient were reviewed by me and considered in my medical decision making (see chart for details).     #Laceration left hand #Concern for infected wound Patient is a 54 year old history of diabetes presenting with left hand laceration and concern for infection. Given timeframe and some signs of early infection will not close the wound. Will place on Keflex. Tetanus updated. Wound care discussed with patient. Discussed strict return and follow-up precautions. Patient verbalized understanding. Final Clinical Impressions(s) / UC Diagnoses   Final diagnoses:  Infected wound  Laceration of left hand without foreign body, initial encounter     Discharge Instructions     Take the antibiotic 4 times a day for 5 days Monitor for more swelling, discharge or worsening symptoms, return or follow up with PCP if not improving over the next 2-3 days  Do daily soap water soaks and keep the wound covered otherwise      ED Prescriptions    Medication Sig Dispense Auth. Provider   cephALEXin (KEFLEX) 500 MG capsule Take 1 capsule (500 mg total) by mouth 4 (four) times daily. 20 capsule Deshay Blumenfeld, Marguerita Beards, PA-C     PDMP not reviewed this encounter.   Purnell Shoemaker, PA-C 03/17/20 2056

## 2020-03-17 NOTE — ED Triage Notes (Signed)
Pt reports he cut his left hand with a glass yesterday. " I pee in the cut to stop the bleeding". Pt  is having mild discomfort in the left ring finger. Pt states he had chills last night.

## 2020-03-17 NOTE — Discharge Instructions (Signed)
Take the antibiotic 4 times a day for 5 days Monitor for more swelling, discharge or worsening symptoms, return or follow up with PCP if not improving over the next 2-3 days  Do daily soap water soaks and keep the wound covered otherwise

## 2020-03-17 NOTE — ED Notes (Signed)
Reports second pfizer vaccine was received on May 6.  Verified with dr hagler that giving the tdap with this timeline was safe.  Dr hagler states this is safe to give tdap at this time

## 2020-03-26 MED FILL — ATORVASTATIN CALCIUM 40 MG: 40 | 30 days supply | Qty: 30 | Fill #0

## 2020-03-26 MED FILL — TAMSULOSIN HCL 0.4 MG CAP: 0.4 | 30 days supply | Qty: 30 | Fill #0

## 2020-03-26 MED FILL — metFORMIN HCL ER 500 MG TB2: 500 | 30 days supply | Qty: 30 | Fill #0

## 2020-04-17 MED FILL — LISINOPRIL 10 MG TABS: 10 | 30 days supply | Qty: 30 | Fill #3

## 2020-04-20 MED FILL — metFORMIN HCL ER 500 MG TB2: 500 | 30 days supply | Qty: 30 | Fill #1

## 2020-04-20 MED FILL — ATORVASTATIN CALCIUM 40 MG: 40 | 30 days supply | Qty: 30 | Fill #1

## 2020-04-20 MED FILL — TAMSULOSIN HCL 0.4 MG CAP: 0.4 | 30 days supply | Qty: 30 | Fill #1

## 2020-05-21 ENCOUNTER — Emergency Department (HOSPITAL_COMMUNITY): Payer: 59

## 2020-05-21 ENCOUNTER — Other Ambulatory Visit: Payer: Self-pay

## 2020-05-21 ENCOUNTER — Emergency Department (HOSPITAL_COMMUNITY)
Admission: EM | Admit: 2020-05-21 | Discharge: 2020-05-21 | Disposition: A | Discharge status: Other Acute Inpatient Hospital | Payer: 59 | Attending: Emergency Medicine | Admitting: Emergency Medicine

## 2020-05-21 DIAGNOSIS — Z20822 Contact with and (suspected) exposure to covid-19: Secondary | ICD-10-CM | POA: Insufficient documentation

## 2020-05-21 DIAGNOSIS — X58XXXA Exposure to other specified factors, initial encounter: Secondary | ICD-10-CM | POA: Insufficient documentation

## 2020-05-21 DIAGNOSIS — F17210 Nicotine dependence, cigarettes, uncomplicated: Secondary | ICD-10-CM | POA: Diagnosis not present

## 2020-05-21 DIAGNOSIS — Z7984 Long term (current) use of oral hypoglycemic drugs: Secondary | ICD-10-CM | POA: Diagnosis not present

## 2020-05-21 DIAGNOSIS — Y929 Unspecified place or not applicable: Secondary | ICD-10-CM | POA: Insufficient documentation

## 2020-05-21 DIAGNOSIS — Z7982 Long term (current) use of aspirin: Secondary | ICD-10-CM | POA: Insufficient documentation

## 2020-05-21 DIAGNOSIS — Y939 Activity, unspecified: Secondary | ICD-10-CM | POA: Insufficient documentation

## 2020-05-21 DIAGNOSIS — E119 Type 2 diabetes mellitus without complications: Secondary | ICD-10-CM | POA: Insufficient documentation

## 2020-05-21 DIAGNOSIS — T1591XA Foreign body on external eye, part unspecified, right eye, initial encounter: Secondary | ICD-10-CM | POA: Insufficient documentation

## 2020-05-21 DIAGNOSIS — Z79899 Other long term (current) drug therapy: Secondary | ICD-10-CM | POA: Diagnosis not present

## 2020-05-21 DIAGNOSIS — Y999 Unspecified external cause status: Secondary | ICD-10-CM | POA: Insufficient documentation

## 2020-05-21 DIAGNOSIS — I10 Essential (primary) hypertension: Secondary | ICD-10-CM | POA: Insufficient documentation

## 2020-05-21 LAB — SARS CORONAVIRUS 2 BY RT PCR (HOSPITAL ORDER, PERFORMED IN ~~LOC~~ HOSPITAL LAB): SARS Coronavirus 2: NEGATIVE

## 2020-05-21 MED ORDER — VANCOMYCIN HCL 2000 MG/400ML IV SOLN
2000.0000 mg | Freq: Once | INTRAVENOUS | Status: AC
  Administered 2020-05-21: 2000 mg via INTRAVENOUS
  Filled 2020-05-21: qty 400

## 2020-05-21 MED FILL — TAMSULOSIN HCL 0.4 MG CAP: 0.4 | 30 days supply | Qty: 30 | Fill #2

## 2020-05-21 MED FILL — ATORVASTATIN CALCIUM 40 MG: 40 | 30 days supply | Qty: 30 | Fill #2

## 2020-05-21 MED FILL — LISINOPRIL 10 MG TABS: 10 | 30 days supply | Qty: 30 | Fill #4

## 2020-05-21 MED FILL — metFORMIN HCL ER 500 MG TB2: 500 | 30 days supply | Qty: 30 | Fill #2

## 2020-05-21 NOTE — ED Notes (Signed)
[facility] has arrived for transport 

## 2020-05-21 NOTE — ED Triage Notes (Signed)
Pt here from [facility] for further eval of vitreous hemorrhage, needing CT scan to rule out foreign body. Pt noticed the bleeding at approx 1600 today.

## 2020-05-21 NOTE — ED Provider Notes (Signed)
[facility] EMERGENCY DEPARTMENT Provider Note   CSN: [id number] Arrival date & time: 05/21/20  1749     History No chief complaint on file.   Dalton Arnold is a 54 y.o. male.  HPI 54 year old male presents from ophthalmology office with foreign body to the right eye.  He felt a piece of metal going to his eye when he was working on a car.  Has a vitreous hemorrhage per ophthalmology and needed a CT of his orbits.  No other injuries.  Last tetanus immunization was about a month ago.   Past Medical History:  Diagnosis Date   Diabetes mellitus without complication ([place])    Diarrhea    Hypertension    Thyroid mass     Patient Active Problem List   Diagnosis Date Noted   Hyperlipidemia 11/27/2018   Obesity (BMI 30.0-34.9) 07/11/2017   Diabetes mellitus type 2 in obese ([place]) 06/20/2016   HTN (hypertension), benign 06/20/2016   Thyroid cancer ([place]) 04/22/2016   Thyroid adenoma 08/19/2014   Illiteracy and low-level literacy 07/14/2014   H. pylori duodenitis 07/10/2014   Duodenitis 06/25/2014   Acute pancreatitis 06/25/2014   Alcohol use 06/25/2014   Smoker 06/25/2014    Past Surgical History:  Procedure Laterality Date   ESOPHAGOGASTRODUODENOSCOPY N/A 06/27/2014   Procedure: ESOPHAGOGASTRODUODENOSCOPY (EGD);  Surgeon: Beryle Beams, MD;  Location: [facility];  Service: Endoscopy;  Laterality: N/A;   HERNIA REPAIR     x2   NASAL FRACTURE SURGERY     THYROIDECTOMY Left 08/19/2014   Procedure: LEFT THYROID LOBECTOMY;  Surgeon: Rozetta Nunnery, MD;  Location: [facility];  Service: ENT;  Laterality: Left;       Family History  Problem Relation Age of Onset   Healthy Mother    Healthy Father    Thyroid disease Neg Hx    Colon cancer Neg Hx     Social History   Tobacco Use   Smoking status: Current Every Day Smoker    Packs/day: 1.00    Types: Cigarettes   Smokeless tobacco: Never Used  Vaping Use   Vaping  Use: Never used  Substance Use Topics   Alcohol use: Yes    Comment: occ.    Drug use: No    Home Medications Prior to Admission medications   Medication Sig Start Date End Date Taking? Authorizing Provider  aspirin 325 MG tablet Take 325 mg by mouth daily.    [provider]  atorvastatin (LIPITOR) 40 MG tablet Take 1 tablet (40 mg total) by mouth daily. 01/13/20   Charlott Rakes, MD  Blood Glucose Monitoring Suppl (CONTOUR NEXT MONITOR) w/Device KIT 1 each by Does not apply route daily. Patient not taking: Reported on 11/27/2018 01/08/18   Charlott Rakes, MD  buPROPion [facility] SR) 100 MG 12 hr tablet Take 1 tablet (100 mg total) by mouth 2 (two) times daily. 05/20/19   Charlott Rakes, MD  cephALEXin (KEFLEX) 500 MG capsule Take 1 capsule (500 mg total) by mouth 4 (four) times daily. 03/17/20   Darr, Marguerita Beards, PA-C  glucose blood (CONTOUR NEXT TEST) test strip Use as instructed daily Patient not taking: Reported on 11/27/2018 01/08/18   Charlott Rakes, MD  hydrocortisone cream 0.5 % Apply 1 application topically 2 (two) times daily. Apply to trunk as needed  For itching Patient not taking: Reported on 01/13/2020 11/27/18   Charlott Rakes, MD  Lancets Thin MISC 1 each by Does not apply route daily. Patient  not taking: Reported on 11/27/2018 01/08/18   Charlott Rakes, MD  lisinopril (ZESTRIL) 10 MG tablet Take 1 tablet (10 mg total) by mouth daily. 01/13/20   Charlott Rakes, MD  meloxicam (MOBIC) 7.5 MG tablet Take 1 tablet (7.5 mg total) by mouth daily. Patient not taking: Reported on 11/27/2018 01/08/18   Charlott Rakes, MD  metFORMIN (GLUCOPHAGE-XR) 500 MG 24 hr tablet Take 1 tablet (500 mg total) by mouth daily with breakfast. 01/13/20   Charlott Rakes, MD  naproxen (NAPROSYN) 500 MG tablet Take 1 tablet (500 mg total) by mouth 2 (two) times daily. Patient not taking: Reported on 11/27/2018 01/31/18   Wieters, Madelynn Done C, PA-C  tamsulosin (FLOMAX) 0.4 MG CAPS capsule Take 1 capsule (0.4  mg total) by mouth daily. 01/13/20   Charlott Rakes, MD  terbinafine (LAMISIL AT) 1 % cream Apply 1 application topically 2 (two) times daily. Apply to feet Patient not taking: Reported on 01/13/2020 11/27/18   Charlott Rakes, MD    Allergies    Patient has no known allergies.  Review of Systems   Review of Systems  Eyes: Positive for pain.    Physical Exam Updated Vital Signs BP (!) 138/103 (BP Location: Right Arm)    Pulse 78    Temp 98.6 F (37 C) (Oral)    Resp 18    Ht 5' 7"  (1.702 m)    Wt 90.7 kg    SpO2 97%    BMI 31.32 kg/m   Physical Exam Vitals and nursing note reviewed.  Constitutional:      General: He is not in acute distress.    Appearance: He is well-developed. He is not ill-appearing or diaphoretic.  HENT:     Head: Normocephalic.     Right Ear: External ear normal.     Left Ear: External ear normal.     Nose: Nose normal.  Eyes:     General:        Right eye: No discharge.        Left eye: No discharge.     Comments: Right eye is dilated from previous ophthalmology exam.  There is a foreign body versus uvea present in the right eye on the medial aspect.  Pulmonary:     Effort: Pulmonary effort is normal.  Abdominal:     General: There is no distension.  Musculoskeletal:     Cervical back: Neck supple.  Skin:    General: Skin is warm and dry.  Neurological:     Mental Status: He is alert.  Psychiatric:        Mood and Affect: Mood is not anxious.     ED Results / Procedures / Treatments   Labs (all labs ordered are listed, but only abnormal results are displayed) Labs Reviewed  SARS CORONAVIRUS 2 BY RT PCR ([facility], [place] [facility])    EKG None  Radiology CT Orbits Wo Contrast  Result Date: 05/21/2020 CLINICAL DATA:  Assessment for foreign body. EXAM: CT ORBITS WITHOUT CONTRAST TECHNIQUE: Multidetector CT images were obtained using the standard protocol without intravenous contrast. COMPARISON:  None. FINDINGS:  Orbits: There is a metallic foreign body within the left orbit abutting the myotendinous insertion of the medial rectus muscle. The foreign body measures 2 x 3 mm. No other orbital abnormality. Visualized sinuses: Clear Soft tissues: Negative. Limited intracranial: No significant or unexpected finding. IMPRESSION: Metallic foreign body within the left orbit abutting the myotendinous insertion of the medial rectus muscle. Electronically  Signed   By: Ulyses Jarred M.D.   On: 05/21/2020 20:49    Procedures Procedures (including critical care time)  Medications Ordered in ED Medications  vancomycin (VANCOREADY) IVPB 2000 mg/400 mL (has no administration in time range)    ED Course  I have reviewed the triage vital signs and the nursing notes.  Pertinent labs & imaging results that were available during my care of the patient were reviewed by me and considered in my medical decision making (see chart for details).    MDM Rules/Calculators/A&P                          Dr. Katy Fitch evaluated the patient at the bedside at the same time his me.  He is concerned that this is either in his orbit or will be if he tries to take it out.  Feels like he needs retinal specialization I recommends [facility] transfer.  I discussed with ophthalmology at [facility], and they accept in transfer, accepting physician is Dr. Nonnie Done.  Ophthalmology is asking for IV vancomycin and IV Avelox.  We do not have IV Avelox as is not formulary and so he states he does not want a substitute but they will give it to him there.  His tetanus immunization is up-to-date. Will place eye shield. He will be transferred to [facility] for surgery Final Clinical Impression(s) / ED Diagnoses Final diagnoses:  Foreign body of right eye, initial encounter    Rx / DC Orders ED Discharge Orders    None       Sherwood Gambler, MD 05/21/20 [phone]

## 2020-05-21 NOTE — Progress Notes (Signed)
Pharmacy Antibiotic Note  [place] JARREL KNOKE is a 54 y.o. male admitted on 05/21/2020 with eye injury.  Pharmacy has been consulted for vancomyin dosing. Pt is afebrile. No labs are available. Pt is pending transfer and will receive IV moxifloxacin s/p transfer.   Plan: Vancomycin 2gm IV x 1  No plans for further doses at this time as pt it to transfer  Height: 5\' 7"  (170.2 cm) Weight: 90.7 kg (200 lb) IBW/kg (Calculated) : 66.1  Temp (24hrs), Avg:98.6 F (37 C), Min:98.6 F (37 C), Max:98.6 F (37 C)  No results for input(s): WBC, CREATININE, LATICACIDVEN, VANCOTROUGH, VANCOPEAK, VANCORANDOM, GENTTROUGH, GENTPEAK, GENTRANDOM, TOBRATROUGH, TOBRAPEAK, TOBRARND, AMIKACINPEAK, AMIKACINTROU, AMIKACIN in the last 168 hours.  CrCl cannot be calculated (Patient's most recent lab result is older than the maximum 21 days allowed.).    No Known Allergies  Thank you for allowing pharmacy to be a part of this patient's care.  Gwenette Wellons, Rande Lawman 05/21/2020 10:38 PM

## 2020-05-21 NOTE — ED Notes (Signed)
Report given to [place] - Charge @ [facility]

## 2020-05-21 NOTE — ED Notes (Signed)
Radiology called to powershare images over to [facility]

## 2020-06-25 MED FILL — ATORVASTATIN CALCIUM 40 MG: 40 | 30 days supply | Qty: 30 | Fill #3

## 2020-06-25 MED FILL — LISINOPRIL 10 MG TABS: 10 | 30 days supply | Qty: 30 | Fill #5

## 2020-06-25 MED FILL — metFORMIN HCL ER 500 MG TB2: 500 | 30 days supply | Qty: 30 | Fill #3

## 2020-06-25 MED FILL — TAMSULOSIN HCL 0.4 MG CAP: 0.4 | 30 days supply | Qty: 30 | Fill #3

## 2020-07-27 MED FILL — LISINOPRIL 10 MG TABS: 10 | 30 days supply | Qty: 30 | Fill #6

## 2020-07-27 MED FILL — ATORVASTATIN CALCIUM 40 MG: 40 | 30 days supply | Qty: 30 | Fill #4

## 2020-07-27 MED FILL — TAMSULOSIN HCL 0.4 MG CAP: 0.4 | 30 days supply | Qty: 30 | Fill #4

## 2020-07-27 MED FILL — metFORMIN HCL ER 500 MG TB2: 500 | 30 days supply | Qty: 30 | Fill #4

## 2020-08-31 MED FILL — ATORVASTATIN CALCIUM 40 MG: 40 | 30 days supply | Qty: 30 | Fill #5

## 2020-08-31 MED FILL — LISINOPRIL 10 MG TABS: 10 | 30 days supply | Qty: 30 | Fill #7

## 2020-08-31 MED FILL — TAMSULOSIN HCL 0.4 MG CAP: 0.4 | 30 days supply | Qty: 30 | Fill #5

## 2020-08-31 MED FILL — metFORMIN HCL ER 500 MG TB2: 500 | 30 days supply | Qty: 30 | Fill #5

## 2020-10-06 MED FILL — TAMSULOSIN HCL 0.4 MG CAP: 0.4 | 30 days supply | Qty: 30 | Fill #0

## 2020-10-06 MED FILL — ATORVASTATIN 40 MG TABLET: 40 | 30 days supply | Qty: 30 | Fill #0

## 2020-10-06 MED FILL — METFORMIN HCL ER 500 MG TB2: 500 | 30 days supply | Qty: 30 | Fill #0

## 2020-10-06 MED FILL — LISINOPRIL 10 MG TABS: 10 | 30 days supply | Qty: 30 | Fill #0

## 2020-11-04 ENCOUNTER — Other Ambulatory Visit: Payer: Self-pay | Admitting: Family Medicine

## 2020-11-04 DIAGNOSIS — E1169 Type 2 diabetes mellitus with other specified complication: Secondary | ICD-10-CM

## 2020-11-04 DIAGNOSIS — E785 Hyperlipidemia, unspecified: Secondary | ICD-10-CM

## 2020-11-04 DIAGNOSIS — R399 Unspecified symptoms and signs involving the genitourinary system: Secondary | ICD-10-CM

## 2020-11-04 MED FILL — ATORVASTATIN 40 MG TABLET: 40 | 30 days supply | Qty: 30 | Fill #0

## 2020-11-04 MED FILL — TAMSULOSIN HCL 0.4 MG CAP: 0.4 | 30 days supply | Qty: 30 | Fill #0

## 2020-11-04 MED FILL — METFORMIN HCL ER 500 MG TB2: 500 | 30 days supply | Qty: 30 | Fill #0

## 2020-11-04 MED FILL — LISINOPRIL 10 MG TABS: 10 | 30 days supply | Qty: 30 | Fill #1

## 2020-11-26 ENCOUNTER — Encounter: Payer: Self-pay | Admitting: Physician Assistant

## 2020-11-26 ENCOUNTER — Other Ambulatory Visit: Payer: Self-pay

## 2020-11-26 ENCOUNTER — Ambulatory Visit: Payer: 59 | Attending: Physician Assistant | Admitting: Physician Assistant

## 2020-11-26 ENCOUNTER — Other Ambulatory Visit: Payer: Self-pay | Admitting: Physician Assistant

## 2020-11-26 VITALS — BP 143/72 | HR 69 | Temp 97.8°F | Ht 67.0 in | Wt 210.0 lb

## 2020-11-26 DIAGNOSIS — E669 Obesity, unspecified: Secondary | ICD-10-CM

## 2020-11-26 DIAGNOSIS — E1169 Type 2 diabetes mellitus with other specified complication: Secondary | ICD-10-CM

## 2020-11-26 DIAGNOSIS — E785 Hyperlipidemia, unspecified: Secondary | ICD-10-CM

## 2020-11-26 DIAGNOSIS — R399 Unspecified symptoms and signs involving the genitourinary system: Secondary | ICD-10-CM

## 2020-11-26 DIAGNOSIS — I10 Essential (primary) hypertension: Secondary | ICD-10-CM

## 2020-11-26 LAB — POCT GLYCOSYLATED HEMOGLOBIN (HGB A1C): Hemoglobin A1C: 6.5 % — AB (ref 4.0–5.6)

## 2020-11-26 LAB — GLUCOSE, POCT (MANUAL RESULT ENTRY): POC Glucose: 177 mg/dl — AB (ref 70–99)

## 2020-11-26 MED ORDER — ATORVASTATIN CALCIUM 40 MG PO TABS: 40.0000 mg | ORAL_TABLET | Freq: Every day | ORAL | 3 refills | Status: DC

## 2020-11-26 MED ORDER — LISINOPRIL 20 MG PO TABS: 20.0000 mg | ORAL_TABLET | Freq: Every day | ORAL | 3 refills | Status: DC

## 2020-11-26 MED ORDER — TAMSULOSIN HCL 0.4 MG PO CAPS: 0.4000 mg | ORAL_CAPSULE | Freq: Every day | ORAL | 3 refills | Status: DC

## 2020-11-26 MED ORDER — METFORMIN HCL ER 500 MG PO TB24: 1000.0000 mg | ORAL_TABLET | Freq: Every day | ORAL | 3 refills | Status: DC

## 2020-11-26 MED FILL — LISINOPRIL 20 MG TABLET: 20 | 90 days supply | Qty: 90 | Fill #0

## 2020-11-26 MED FILL — TAMSULOSIN HCL 0.4 MG CAP: 0.4 | 30 days supply | Qty: 30 | Fill #0

## 2020-11-26 MED FILL — ATORVASTATIN 40 MG TABLET: 40 | 30 days supply | Qty: 30 | Fill #0

## 2020-11-26 MED FILL — METFORMIN HCL ER 500 MG TB2: 500 | 30 days supply | Qty: 60 | Fill #0

## 2020-11-26 NOTE — Progress Notes (Signed)
Dalton Arnold, is a 55 y.o. male  PPI:[id number]  AYT:[id number]  DOB - 26-Jan-1966  Subjective:  Chief Complaint and HPI: Dalton Arnold is a 55 y.o. male here today for med RF.    No issues or concerns today.  No CP/HA/dizziness.    ROS:   Constitutional:  No f/c, No night sweats, No unexplained weight loss. EENT:  No vision changes, No blurry vision, No hearing changes. No mouth, throat, or ear problems.  Respiratory: No cough, No SOB Cardiac: No CP, no palpitations GI:  No abd pain, No N/V/D. GU: No Urinary s/sx Musculoskeletal: No joint pain Neuro: No headache, no dizziness, no motor weakness.  Skin: No rash Endocrine:  No polydipsia. No polyuria.  Psych: Denies SI/HI  No problems updated.  ALLERGIES: No Known Allergies  PAST MEDICAL HISTORY: Past Medical History:  Diagnosis Date  . Diabetes mellitus without complication ([place])   . Diarrhea   . Hypertension   . Thyroid mass     MEDICATIONS AT HOME: Prior to Admission medications   Medication Sig Start Date End Date Taking? Authorizing Provider  aspirin 325 MG tablet Take 325 mg by mouth daily.   Yes [provider]  cephALEXin (KEFLEX) 500 MG capsule Take 1 capsule (500 mg total) by mouth 4 (four) times daily. 03/17/20  Yes Darr, Edison Nasuti, PA-C  lisinopril (ZESTRIL) 20 MG tablet Take 1 tablet (20 mg total) by mouth daily. 11/26/20  Yes Dalton Arnold M, PA-C  atorvastatin (LIPITOR) 40 MG tablet Take 1 tablet (40 mg total) by mouth daily. 11/26/20   Argentina Donovan, PA-C  Blood Glucose Monitoring Suppl (CONTOUR NEXT MONITOR) w/Device KIT 1 each by Does not apply route daily. Patient not taking: Reported on 11/26/2020 01/08/18   Charlott Rakes, MD  buPROPion [facility] SR) 100 MG 12 hr tablet Take 1 tablet (100 mg total) by mouth 2 (two) times daily. Patient not taking: Reported on 11/26/2020 05/20/19   Charlott Rakes, MD  glucose blood (CONTOUR NEXT TEST) test strip Use as instructed daily Patient not  taking: Reported on 11/26/2020 01/08/18   Charlott Rakes, MD  hydrocortisone cream 0.5 % Apply 1 application topically 2 (two) times daily. Apply to trunk as needed  For itching Patient not taking: No sig reported 11/27/18   Charlott Rakes, MD  Lancets Thin MISC 1 each by Does not apply route daily. Patient not taking: Reported on 11/26/2020 01/08/18   Charlott Rakes, MD  meloxicam (MOBIC) 7.5 MG tablet Take 1 tablet (7.5 mg total) by mouth daily. Patient not taking: No sig reported 01/08/18   Charlott Rakes, MD  metFORMIN (GLUCOPHAGE-XR) 500 MG 24 hr tablet Take 2 tablets (1,000 mg total) by mouth daily with breakfast. 11/26/20   Argentina Donovan, PA-C  naproxen (NAPROSYN) 500 MG tablet Take 1 tablet (500 mg total) by mouth 2 (two) times daily. Patient not taking: No sig reported 01/31/18   Wieters, Hallie C, PA-C  tamsulosin (FLOMAX) 0.4 MG CAPS capsule Take 1 capsule (0.4 mg total) by mouth daily. 11/26/20   Argentina Donovan, PA-C  terbinafine (LAMISIL AT) 1 % cream Apply 1 application topically 2 (two) times daily. Apply to feet Patient not taking: No sig reported 11/27/18   Charlott Rakes, MD     Objective:  EXAM:   Vitals:   11/26/20 1114  BP: (!) 143/72  Pulse: 69  Temp: 97.8 F (36.6 C)  TempSrc: Oral  SpO2: 96%  Weight: 210 lb (95.3 kg)  Height: 5' 7"  (1.702  m)    General appearance : A&OX3. NAD. Non-toxic-appearing HEENT: Atraumatic and Normocephalic.  PERRLA. EOM intact. Chest/Lungs:  Breathing-non-labored, Good air entry bilaterally, breath sounds normal without rales, rhonchi, or wheezing  CVS: S1 S2 regular, no murmurs, gallops, rubs  Extremities: Bilateral Lower Ext shows no edema, both legs are warm to touch with = pulse throughout Neurology:  CN II-XII grossly intact, Non focal.   Psych:  TP linear. J/I WNL. Normal speech. Appropriate eye contact and affect.  Skin:  No Rash  Data Review Lab Results  Component Value Date   HGBA1C 6.5 (A) 11/26/2020   HGBA1C 6.8  01/13/2020   HGBA1C 6.5 05/20/2019     Assessment & Plan   1. Diabetes mellitus type 2 in obese [facility]) Not at goal-increase metformin to 2 daily - Glucose (CBG) - HgB A1c - Comprehensive metabolic panel - Lipid panel - CBC with Differential/Platelet - metFORMIN (GLUCOPHAGE-XR) 500 MG 24 hr tablet; Take 2 tablets (1,000 mg total) by mouth daily with breakfast.  Dispense: 60 tablet; Refill: 3  2. Hyperlipidemia associated with type 2 diabetes mellitus (H[facility]) - Lipid panel - atorvastatin (LIPITOR) 40 MG tablet; Take 1 tablet (40 mg total) by mouth daily.  Dispense: 30 tablet; Refill: 3  3. HTN (hypertension), benign Not at goal-increase to 76m and check BP OOO and record  - Comprehensive metabolic panel - lisinopril (ZESTRIL) 20 MG tablet; Take 1 tablet (20 mg total) by mouth daily.  Dispense: 90 tablet; Refill: 3  4. Lower urinary tract symptoms - tamsulosin (FLOMAX) 0.4 MG CAPS capsule; Take 1 capsule (0.4 mg total) by mouth daily.  Dispense: 30 capsule; Refill: 3 - PSA     Patient have been counseled extensively about nutrition and exercise  Return in about 4 months (around 03/26/2021) for PCP;  chronic conditions.  The patient was given clear instructions to go to ER or return to medical center if symptoms don't improve, worsen or new problems develop. The patient verbalized understanding. The patient was told to call to get lab results if they haven't heard anything in the next week.     AFreeman Caldron PA-C [facility] [place]  11/26/2020, 11:40 AMPatient ID: ADemetrius Arnold male   DOB: 503-02-1966 55y.o.   MRN: [id number]

## 2020-11-27 LAB — COMPREHENSIVE METABOLIC PANEL
ALT: 33 IU/L (ref 0–44)
AST: 22 IU/L (ref 0–40)
Albumin/Globulin Ratio: 1.7 (ref 1.2–2.2)
Albumin: 4.3 g/dL (ref 3.8–4.9)
Alkaline Phosphatase: 91 IU/L (ref 44–121)
BUN/Creatinine Ratio: 21 — ABNORMAL HIGH (ref 9–20)
BUN: 15 mg/dL (ref 6–24)
Bilirubin Total: 0.3 mg/dL (ref 0.0–1.2)
CO2: 20 mmol/L (ref 20–29)
Calcium: 9.6 mg/dL (ref 8.7–10.2)
Chloride: 101 mmol/L (ref 96–106)
Creatinine, Ser: 0.73 mg/dL — ABNORMAL LOW (ref 0.76–1.27)
GFR calc Af Amer: 120 mL/min/{1.73_m2} (ref >59)
GFR calc non Af Amer: 104 mL/min/{1.73_m2} (ref >59)
Globulin, Total: 2.5 g/dL (ref 1.5–4.5)
Glucose: 172 mg/dL — ABNORMAL HIGH (ref 65–99)
Potassium: 4.4 mmol/L (ref 3.5–5.2)
Sodium: 137 mmol/L (ref 134–144)
Total Protein: 6.8 g/dL (ref 6.0–8.5)

## 2020-11-27 LAB — CBC WITH DIFFERENTIAL/PLATELET
Basophils Absolute: 0.1 10*3/uL (ref 0.0–0.2)
Basos: 1 %
EOS (ABSOLUTE): 0.5 10*3/uL — ABNORMAL HIGH (ref 0.0–0.4)
Eos: 4 %
Hematocrit: 46.5 % (ref 37.5–51.0)
Hemoglobin: 15.2 g/dL (ref 13.0–17.7)
Immature Grans (Abs): 0 10*3/uL (ref 0.0–0.1)
Immature Granulocytes: 0 %
Lymphocytes Absolute: 2.8 10*3/uL (ref 0.7–3.1)
Lymphs: 24 %
MCH: 26.2 pg — ABNORMAL LOW (ref 26.6–33.0)
MCHC: 32.7 g/dL (ref 31.5–35.7)
MCV: 80 fL (ref 79–97)
Monocytes Absolute: 0.7 10*3/uL (ref 0.1–0.9)
Monocytes: 6 %
Neutrophils Absolute: 7.6 10*3/uL — ABNORMAL HIGH (ref 1.4–7.0)
Neutrophils: 65 %
Platelets: 376 10*3/uL (ref 150–450)
RBC: 5.8 x10E6/uL (ref 4.14–5.80)
RDW: 14.9 % (ref 11.6–15.4)
WBC: 11.7 10*3/uL — ABNORMAL HIGH (ref 3.4–10.8)

## 2020-11-27 LAB — PSA: Prostate Specific Ag, Serum: 1.6 ng/mL (ref 0.0–4.0)

## 2020-11-27 LAB — LIPID PANEL
Chol/HDL Ratio: 5.4 ratio — ABNORMAL HIGH (ref 0.0–5.0)
Cholesterol, Total: 177 mg/dL (ref 100–199)
HDL: 33 mg/dL — ABNORMAL LOW (ref >39)
LDL Chol Calc (NIH): 103 mg/dL — ABNORMAL HIGH (ref 0–99)
Triglycerides: 239 mg/dL — ABNORMAL HIGH (ref 0–149)
VLDL Cholesterol Cal: 41 mg/dL — ABNORMAL HIGH (ref 5–40)

## 2021-01-05 ENCOUNTER — Other Ambulatory Visit (HOSPITAL_COMMUNITY): Payer: Self-pay

## 2021-01-05 MED FILL — Metformin HCl Tab ER 24HR 500 MG: ORAL | 30 days supply | Qty: 60 | Fill #0 | Status: AC

## 2021-01-05 MED FILL — Tamsulosin HCl Cap 0.4 MG: ORAL | 30 days supply | Qty: 30 | Fill #0 | Status: AC

## 2021-01-05 MED FILL — Atorvastatin Calcium Tab 40 MG (Base Equivalent): ORAL | 30 days supply | Qty: 30 | Fill #0 | Status: AC

## 2021-02-08 ENCOUNTER — Other Ambulatory Visit (HOSPITAL_COMMUNITY): Payer: Self-pay

## 2021-02-08 MED FILL — Metformin HCl Tab ER 24HR 500 MG: ORAL | 30 days supply | Qty: 60 | Fill #1 | Status: AC

## 2021-02-08 MED FILL — Tamsulosin HCl Cap 0.4 MG: ORAL | 30 days supply | Qty: 30 | Fill #1 | Status: AC

## 2021-02-08 MED FILL — Atorvastatin Calcium Tab 40 MG (Base Equivalent): ORAL | 30 days supply | Qty: 30 | Fill #1 | Status: AC

## 2021-02-08 MED FILL — Lisinopril Tab 20 MG: ORAL | 90 days supply | Qty: 90 | Fill #0 | Status: AC

## 2021-03-11 ENCOUNTER — Other Ambulatory Visit (HOSPITAL_COMMUNITY): Payer: Self-pay

## 2021-03-11 MED FILL — Atorvastatin Calcium Tab 40 MG (Base Equivalent): ORAL | 30 days supply | Qty: 30 | Fill #2 | Status: AC

## 2021-03-11 MED FILL — Tamsulosin HCl Cap 0.4 MG: ORAL | 30 days supply | Qty: 30 | Fill #2 | Status: AC

## 2021-03-11 MED FILL — Metformin HCl Tab ER 24HR 500 MG: ORAL | 30 days supply | Qty: 60 | Fill #2 | Status: AC

## 2021-03-29 ENCOUNTER — Ambulatory Visit: Payer: 59 | Attending: Family Medicine | Admitting: Family Medicine

## 2021-03-29 ENCOUNTER — Other Ambulatory Visit: Payer: Self-pay

## 2021-03-29 ENCOUNTER — Encounter: Payer: Self-pay | Admitting: Family Medicine

## 2021-03-29 ENCOUNTER — Other Ambulatory Visit (HOSPITAL_COMMUNITY): Payer: Self-pay

## 2021-03-29 VITALS — BP 116/77 | HR 69 | Ht 67.0 in | Wt 205.2 lb

## 2021-03-29 DIAGNOSIS — I10 Essential (primary) hypertension: Secondary | ICD-10-CM

## 2021-03-29 DIAGNOSIS — E785 Hyperlipidemia, unspecified: Secondary | ICD-10-CM

## 2021-03-29 DIAGNOSIS — Z1211 Encounter for screening for malignant neoplasm of colon: Secondary | ICD-10-CM

## 2021-03-29 DIAGNOSIS — E1169 Type 2 diabetes mellitus with other specified complication: Secondary | ICD-10-CM | POA: Diagnosis not present

## 2021-03-29 DIAGNOSIS — E669 Obesity, unspecified: Secondary | ICD-10-CM

## 2021-03-29 DIAGNOSIS — Z23 Encounter for immunization: Secondary | ICD-10-CM | POA: Diagnosis not present

## 2021-03-29 DIAGNOSIS — R399 Unspecified symptoms and signs involving the genitourinary system: Secondary | ICD-10-CM

## 2021-03-29 LAB — POCT GLYCOSYLATED HEMOGLOBIN (HGB A1C): HbA1c, POC (controlled diabetic range): 6.4 % (ref 0.0–7.0)

## 2021-03-29 LAB — GLUCOSE, POCT (MANUAL RESULT ENTRY): POC Glucose: 108 mg/dl — AB (ref 70–99)

## 2021-03-29 MED ORDER — ATORVASTATIN CALCIUM 40 MG PO TABS
ORAL_TABLET | Freq: Every day | ORAL | 6 refills | Status: DC
  Filled 2021-03-29: qty 30, fill #0
  Filled 2021-04-09 (×2): qty 30, 30d supply, fill #0
  Filled 2021-05-11: qty 30, 30d supply, fill #1
  Filled 2021-06-11: qty 30, 30d supply, fill #2
  Filled 2021-07-12: qty 30, 30d supply, fill #3
  Filled 2021-08-16: qty 30, 30d supply, fill #4
  Filled 2021-09-20: qty 30, 30d supply, fill #5
  Filled 2021-10-25: qty 30, 30d supply, fill #6

## 2021-03-29 MED ORDER — METFORMIN HCL ER 500 MG PO TB24
ORAL_TABLET | Freq: Every day | ORAL | 6 refills | Status: DC
  Filled 2021-03-29: qty 60, fill #0
  Filled 2021-04-09 (×2): qty 60, 30d supply, fill #0
  Filled 2021-05-11: qty 60, 30d supply, fill #1
  Filled 2021-06-11: qty 60, 30d supply, fill #2
  Filled 2021-07-12: qty 60, 30d supply, fill #3
  Filled 2021-08-16: qty 60, 30d supply, fill #4
  Filled 2021-09-20: qty 60, 30d supply, fill #5
  Filled 2021-10-25: qty 60, 30d supply, fill #6

## 2021-03-29 MED ORDER — TAMSULOSIN HCL 0.4 MG PO CAPS
ORAL_CAPSULE | Freq: Every day | ORAL | 6 refills | Status: DC
  Filled 2021-03-29: qty 30, fill #0
  Filled 2021-04-09 (×2): qty 30, 30d supply, fill #0
  Filled 2021-05-11: qty 30, 30d supply, fill #1
  Filled 2021-06-11: qty 30, 30d supply, fill #2
  Filled 2021-07-12: qty 30, 30d supply, fill #3
  Filled 2021-08-16: qty 30, 30d supply, fill #4
  Filled 2021-09-20: qty 30, 30d supply, fill #5
  Filled 2021-10-25: qty 30, 30d supply, fill #6

## 2021-03-29 MED ORDER — LISINOPRIL 20 MG PO TABS
ORAL_TABLET | Freq: Every day | ORAL | 6 refills | Status: DC
  Filled 2021-03-29: qty 30, fill #0
  Filled 2021-05-11: qty 30, 30d supply, fill #0
  Filled 2021-06-11: qty 30, 30d supply, fill #1
  Filled 2021-07-12: qty 30, 30d supply, fill #2
  Filled 2021-08-16: qty 30, 30d supply, fill #3
  Filled 2021-09-20: qty 30, 30d supply, fill #4
  Filled 2021-10-25: qty 30, 30d supply, fill #5

## 2021-03-29 NOTE — Patient Instructions (Signed)

## 2021-03-29 NOTE — Progress Notes (Signed)
Subjective:  Patient ID: Dalton Arnold, male    DOB: Oct 22, 1965  Age: 55 y.o. MRN: [id number]  CC: Diabetes   HPI Dalton Arnold  is a 55 year old male with history of type 2 diabetes mellitus (A1c 6.4), hypertension, thyroid adenocarcinoma diagnosed in 08/2014 (status post left lobectomy, last seen by endocrine in 04/2016 and declined complete lobectomy and radioactive iodine therapy) who presents today for a follow-up visit. In 05/2020 he had globe exploration due to closed injury of right eye with metallic foreign body which occurred at work.  His vision is now normal in both eyes.    Interval History: He has been taking two 21m tabs of Lisinopril rather than a 230m His lisinopril dose had been increased from 10 mg to 20 mg at his last visit with the PA.  His blood pressure taken at a blood drive was 13502/[id number] he informs me that he has had some episodes of dizziness at home but has not been checking his blood pressures. Lower urinary tract symptoms are controlled on Flomax.  He has not been checking his blood sugars With regards to hypoglycemia, denies episodes of hypoglycemia, visual symptoms are absent, neuropathy is absent. Not up-to-date on annual eye exam. Compliant with his statin.  Denies additional concerns today. Past Medical History:  Diagnosis Date   Diabetes mellitus without complication ([facility]   Diarrhea    Hypertension    Thyroid mass     Past Surgical History:  Procedure Laterality Date   ESOPHAGOGASTRODUODENOSCOPY N/A 06/27/2014   Procedure: ESOPHAGOGASTRODUODENOSCOPY (EGD);  Surgeon: PaBeryle BeamsMD;  [facility];  Service: Endoscopy;  Laterality: N/A;   HERNIA REPAIR     x2   NASAL FRACTURE SURGERY     THYROIDECTOMY Left 08/19/2014   Procedure: LEFT THYROID LOBECTOMY;  Surgeon: ChRozetta NunneryMD;  Location: [facility];  Service: ENT;  Laterality: Left;    Family History  Problem Relation Age of Onset   Healthy Mother    Healthy  Father    Thyroid disease Neg Hx    Colon cancer Neg Hx     No Known Allergies  Outpatient Medications Prior to Visit  Medication Sig Dispense Refill   aspirin 325 MG tablet Take 325 mg by mouth daily.     atorvastatin (LIPITOR) 40 MG tablet TAKE 1 TABLET (40 MG TOTAL) BY MOUTH DAILY. 30 tablet 3   cephALEXin (KEFLEX) 500 MG capsule Take 1 capsule (500 mg total) by mouth 4 (four) times daily. 20 capsule 0   lisinopril (ZESTRIL) 20 MG tablet TAKE 1 TABLET (20 MG TOTAL) BY MOUTH DAILY. 90 tablet 3   metFORMIN (GLUCOPHAGE-XR) 500 MG 24 hr tablet TAKE 2 TABLETS (1,000 MG TOTAL) BY MOUTH DAILY WITH BREAKFAST. 60 tablet 3   tamsulosin (FLOMAX) 0.4 MG CAPS capsule TAKE 1 CAPSULE (0.4 MG TOTAL) BY MOUTH DAILY. 30 capsule 3   Blood Glucose Monitoring Suppl (CONTOUR NEXT MONITOR) w/Device KIT 1 each by Does not apply route daily. (Patient not taking: No sig reported) 1 kit 0   glucose blood (CONTOUR NEXT TEST) test strip Use as instructed daily (Patient not taking: No sig reported) 30 each 12   Lancets Thin MISC 1 each by Does not apply route daily. (Patient not taking: No sig reported) 30 each 12   buPROPion (WELLBUTRIN SR) 100 MG 12 hr tablet Take 1 tablet (100 mg total) by mouth 2 (two) times daily. (Patient not taking: No sig reported) 60 tablet 3  hydrocortisone cream 0.5 % Apply 1 application topically 2 (two) times daily. Apply to trunk as needed  For itching (Patient not taking: No sig reported) 56 g 1   meloxicam (MOBIC) 7.5 MG tablet Take 1 tablet (7.5 mg total) by mouth daily. (Patient not taking: No sig reported) 30 tablet 1   naproxen (NAPROSYN) 500 MG tablet Take 1 tablet (500 mg total) by mouth 2 (two) times daily. (Patient not taking: No sig reported) 30 tablet 0   terbinafine (LAMISIL AT) 1 % cream Apply 1 application topically 2 (two) times daily. Apply to feet (Patient not taking: No sig reported) 42 g 3   Facility-Administered Medications Prior to Visit  Medication Dose Route  Frequency Provider Last Rate Last Admin   0.9 %  sodium chloride infusion  500 mL Intravenous Once Danis, Henry L III, MD         ROS Review of Systems  Constitutional:  Negative for activity change and appetite change.  HENT:  Negative for sinus pressure and sore throat.   Eyes:  Negative for visual disturbance.  Respiratory:  Negative for cough, chest tightness and shortness of breath.   Cardiovascular:  Negative for chest pain and leg swelling.  Gastrointestinal:  Negative for abdominal distention, abdominal pain, constipation and diarrhea.  Endocrine: Negative.   Genitourinary:  Negative for dysuria.  Musculoskeletal:  Negative for joint swelling and myalgias.  Skin:  Negative for rash.  Allergic/Immunologic: Negative.   Neurological:  Negative for weakness, light-headedness and numbness.  Psychiatric/Behavioral:  Negative for dysphoric mood and suicidal ideas.    Objective:  BP 116/77   Pulse 69   Ht 5' 7" (1.702 m)   Wt 205 lb 3.2 oz (93.1 kg)   SpO2 96%   BMI 32.14 kg/m   BP/Weight 03/29/2021 11/26/2020 05/21/2020  Systolic BP 116 143 144  Diastolic BP 77 72 90  Wt. (Lbs) 205.2 210 200  BMI 32.14 32.89 31.32      Physical Exam Constitutional:      Appearance: He is well-developed.  Neck:     Vascular: No JVD.  Cardiovascular:     Rate and Rhythm: Normal rate.     Heart sounds: Normal heart sounds. No murmur heard. Pulmonary:     Effort: Pulmonary effort is normal.     Breath sounds: Normal breath sounds. No wheezing or rales.  Chest:     Chest wall: No tenderness.  Abdominal:     General: Bowel sounds are normal. There is no distension.     Palpations: Abdomen is soft. There is no mass.     Tenderness: There is no abdominal tenderness.  Musculoskeletal:        General: Normal range of motion.     Right lower leg: No edema.     Left lower leg: No edema.  Neurological:     Mental Status: He is alert and oriented to person, place, and time.  Psychiatric:         Mood and Affect: Mood normal.    CMP Latest Ref Rng & Units 11/26/2020 01/14/2020 07/19/2019  Glucose 65 - 99 mg/dL 172(H) 113(H) 92  BUN 6 - 24 mg/dL 15 13 12  Creatinine 0.76 - 1.27 mg/dL 0.73(L) 0.92 0.84  Sodium 134 - 144 mmol/L 137 140 132(L)  Potassium 3.5 - 5.2 mmol/L 4.4 5.0 3.8  Chloride 96 - 106 mmol/L 101 104 99  CO2 20 - 29 mmol/L 20 21 23  Calcium 8.7 - 10.2 mg/dL 9.6   9.4 8.9  Total Protein 6.0 - 8.5 g/dL 6.8 7.0 7.1  Total Bilirubin 0.0 - 1.2 mg/dL 0.3 <0.2 0.8  Alkaline Phos 44 - 121 IU/L 91 109 87  AST 0 - 40 IU/L 22 24 26  ALT 0 - 44 IU/L 33 33 29    Lipid Panel     Component Value Date/Time   CHOL 177 11/26/2020 1135   TRIG 239 (H) 11/26/2020 1135   HDL 33 (L) 11/26/2020 1135   CHOLHDL 5.4 (H) 11/26/2020 1135   CHOLHDL 6.2 (H) 04/11/2016 1009   VLDL 30 04/11/2016 1009   LDLCALC 103 (H) 11/26/2020 1135    CBC    Component Value Date/Time   WBC 11.7 (H) 11/26/2020 1135   WBC 11.8 (H) 07/19/2019 1705   RBC 5.80 11/26/2020 1135   RBC 5.87 (H) 07/19/2019 1705   HGB 15.2 11/26/2020 1135   HCT 46.5 11/26/2020 1135   PLT 376 11/26/2020 1135   MCV 80 11/26/2020 1135   MCH 26.2 (L) 11/26/2020 1135   MCH 25.2 (L) 07/19/2019 1705   MCHC 32.7 11/26/2020 1135   MCHC 33.3 07/19/2019 1705   RDW 14.9 11/26/2020 1135   LYMPHSABS 2.8 11/26/2020 1135   MONOABS 0.8 07/19/2019 1705   EOSABS 0.5 (H) 11/26/2020 1135   BASOSABS 0.1 11/26/2020 1135    Lab Results  Component Value Date   HGBA1C 6.4 03/29/2021    Assessment & Plan:  1. Diabetes mellitus type 2 in obese (HCC) Controlled with A1c of 6.4 Continue current regimen Counseled on Diabetic diet, my plate method, 150 minutes of moderate intensity exercise/week Blood sugar logs with fasting goals of 80-120 mg/dl, random of less than 180 and in the event of sugars less than 60 mg/dl or greater than 400 mg/dl encouraged to notify the clinic. Advised on the need for annual eye exams, annual foot exams,  Pneumonia vaccine. - POCT glucose (manual entry) - POCT glycosylated hemoglobin (Hb A1C) - metFORMIN (GLUCOPHAGE-XR) 500 MG 24 hr tablet; TAKE 2 TABLETS (1,000 MG TOTAL) BY MOUTH DAILY WITH BREAKFAST.  Dispense: 60 tablet; Refill: 6 - Ambulatory referral to Ophthalmology  2. Hyperlipidemia associated with type 2 diabetes mellitus (HCC) LDL is 103 above goal of less than 70 Continue to work on low-cholesterol diet - Basic Metabolic Panel - atorvastatin (LIPITOR) 40 MG tablet; TAKE 1 TABLET (40 MG TOTAL) BY MOUTH DAILY.  Dispense: 30 tablet; Refill: 6  3. HTN (hypertension), benign Controlled He has been educated on correct dose of lisinopril of 20 mg daily and advised to check his medication bottle at home to ensure this is correct. Counseled on blood pressure goal of less than 130/80, low-sodium, DASH diet, medication compliance, 150 minutes of moderate intensity exercise per week. Discussed medication compliance, adverse effects. - lisinopril (ZESTRIL) 20 MG tablet; TAKE 1 TABLET (20 MG TOTAL) BY MOUTH DAILY.  Dispense: 30 tablet; Refill: 6  4. Lower urinary tract symptoms Stable - tamsulosin (FLOMAX) 0.4 MG CAPS capsule; TAKE 1 CAPSULE (0.4 MG TOTAL) BY MOUTH DAILY.  Dispense: 30 capsule; Refill: 6  5. Screening for colon cancer - Ambulatory referral to Gastroenterology  6. Need for pneumococcal vaccine - Pneumococcal conjugate vaccine 20-valent   Meds ordered this encounter  Medications   atorvastatin (LIPITOR) 40 MG tablet    Sig: TAKE 1 TABLET (40 MG TOTAL) BY MOUTH DAILY.    Dispense:  30 tablet    Refill:  6   lisinopril (ZESTRIL) 20 MG tablet    Sig:   TAKE 1 TABLET (20 MG TOTAL) BY MOUTH DAILY.    Dispense:  30 tablet    Refill:  6   metFORMIN (GLUCOPHAGE-XR) 500 MG 24 hr tablet    Sig: TAKE 2 TABLETS (1,000 MG TOTAL) BY MOUTH DAILY WITH BREAKFAST.    Dispense:  60 tablet    Refill:  6   tamsulosin (FLOMAX) 0.4 MG CAPS capsule    Sig: TAKE 1 CAPSULE (0.4 MG TOTAL)  BY MOUTH DAILY.    Dispense:  30 capsule    Refill:  6     Follow-up: Return in about 6 months (around 09/28/2021) for Chronic medical condition.       Charlott Rakes, MD, FAAFP. [facility] and [place], [place]   03/29/2021, 12:50 PM

## 2021-03-30 LAB — BASIC METABOLIC PANEL
BUN/Creatinine Ratio: 16 (ref 9–20)
BUN: 13 mg/dL (ref 6–24)
CO2: 22 mmol/L (ref 20–29)
Calcium: 9.1 mg/dL (ref 8.7–10.2)
Chloride: 105 mmol/L (ref 96–106)
Creatinine, Ser: 0.81 mg/dL (ref 0.76–1.27)
Glucose: 93 mg/dL (ref 65–99)
Potassium: 4.8 mmol/L (ref 3.5–5.2)
Sodium: 141 mmol/L (ref 134–144)
eGFR: 103 mL/min/{1.73_m2} (ref >59)

## 2021-04-02 ENCOUNTER — Telehealth: Payer: Self-pay

## 2021-04-02 NOTE — Telephone Encounter (Signed)
-----   Message from Charlott Rakes, MD sent at 03/30/2021 12:35 PM EDT ----- Please inform the patient that labs are normal. Thank you.

## 2021-04-02 NOTE — Telephone Encounter (Signed)
Patient name and DOB has been verified Patient was informed of lab results. Patient had no questions.  

## 2021-04-09 ENCOUNTER — Other Ambulatory Visit (HOSPITAL_COMMUNITY): Payer: Self-pay

## 2021-04-26 ENCOUNTER — Encounter: Payer: Self-pay | Admitting: Gastroenterology

## 2021-05-11 ENCOUNTER — Other Ambulatory Visit (HOSPITAL_COMMUNITY): Payer: Self-pay

## 2021-06-11 ENCOUNTER — Other Ambulatory Visit (HOSPITAL_COMMUNITY): Payer: Self-pay

## 2021-07-12 ENCOUNTER — Other Ambulatory Visit (HOSPITAL_COMMUNITY): Payer: Self-pay

## 2021-08-16 ENCOUNTER — Other Ambulatory Visit (HOSPITAL_COMMUNITY): Payer: Self-pay

## 2021-09-01 ENCOUNTER — Encounter: Payer: Self-pay | Admitting: Gastroenterology

## 2021-09-20 ENCOUNTER — Other Ambulatory Visit (HOSPITAL_COMMUNITY): Payer: Self-pay

## 2021-09-29 ENCOUNTER — Ambulatory Visit: Payer: 59 | Admitting: Family Medicine

## 2021-10-12 ENCOUNTER — Telehealth: Payer: Self-pay

## 2021-10-12 NOTE — Telephone Encounter (Signed)
Hello Dalton Arnold:  Please look at anesthesia even for  05/22/20 to decide if ok for [facility].  Thank you

## 2021-10-20 ENCOUNTER — Other Ambulatory Visit: Payer: Self-pay

## 2021-10-20 ENCOUNTER — Other Ambulatory Visit (HOSPITAL_COMMUNITY): Payer: Self-pay

## 2021-10-20 ENCOUNTER — Ambulatory Visit (AMBULATORY_SURGERY_CENTER): Payer: 59

## 2021-10-20 VITALS — Ht 67.0 in | Wt 200.0 lb

## 2021-10-20 DIAGNOSIS — Z86010 Personal history of colonic polyps: Secondary | ICD-10-CM

## 2021-10-20 MED ORDER — PEG 3350-KCL-NA BICARB-NACL 420 G PO SOLR
4000.0000 mL | Freq: Once | ORAL | 0 refills | Status: AC
  Filled 2021-10-20: qty 4000, 1d supply, fill #0

## 2021-10-20 NOTE — Progress Notes (Signed)
No egg or soy allergy known to patient  No issues known to pt with past sedation with any surgeries or procedures Patient denies ever being told they had issues or difficulty with intubation  No FH of Malignant Hyperthermia Pt is not on diet pills Pt is not on home 02  Pt is not on blood thinners  Pt denies issues with constipation; No A fib or A flutter Pt is fully vaccinated for Covid x 2 + boosters; NO PA's for preps discussed with pt in PV today  Discussed with pt there will be an out-of-pocket cost for prep and that varies from $0 to 70 + dollars - pt verbalized understanding  Due to the COVID-19 pandemic we are asking patients to follow certain guidelines in PV and the [place]   Pt aware of COVID protocols and [facility] guidelines  PV completed over the phone. Pt verified name, DOB, address and insurance during PV today.  Pt mailed instruction packet with copy of consent form to read and not return, and instructions.  Pt encouraged to call with questions or issues.  If pt has My chart, procedure instructions sent via My Chart

## 2021-10-25 ENCOUNTER — Other Ambulatory Visit (HOSPITAL_COMMUNITY): Payer: Self-pay

## 2021-10-26 ENCOUNTER — Other Ambulatory Visit: Payer: Self-pay

## 2021-10-26 ENCOUNTER — Ambulatory Visit: Payer: 59 | Attending: Family Medicine | Admitting: Family Medicine

## 2021-10-26 ENCOUNTER — Encounter: Payer: Self-pay | Admitting: Family Medicine

## 2021-10-26 ENCOUNTER — Other Ambulatory Visit (HOSPITAL_COMMUNITY): Payer: Self-pay

## 2021-10-26 VITALS — BP 131/86 | HR 75 | Ht 67.0 in | Wt 213.4 lb

## 2021-10-26 DIAGNOSIS — Z23 Encounter for immunization: Secondary | ICD-10-CM

## 2021-10-26 DIAGNOSIS — Z6833 Body mass index (BMI) 33.0-33.9, adult: Secondary | ICD-10-CM

## 2021-10-26 DIAGNOSIS — F17210 Nicotine dependence, cigarettes, uncomplicated: Secondary | ICD-10-CM | POA: Diagnosis not present

## 2021-10-26 DIAGNOSIS — E1169 Type 2 diabetes mellitus with other specified complication: Secondary | ICD-10-CM

## 2021-10-26 DIAGNOSIS — E669 Obesity, unspecified: Secondary | ICD-10-CM

## 2021-10-26 DIAGNOSIS — R399 Unspecified symptoms and signs involving the genitourinary system: Secondary | ICD-10-CM | POA: Diagnosis not present

## 2021-10-26 DIAGNOSIS — F17200 Nicotine dependence, unspecified, uncomplicated: Secondary | ICD-10-CM

## 2021-10-26 DIAGNOSIS — I10 Essential (primary) hypertension: Secondary | ICD-10-CM | POA: Diagnosis not present

## 2021-10-26 DIAGNOSIS — E785 Hyperlipidemia, unspecified: Secondary | ICD-10-CM

## 2021-10-26 DIAGNOSIS — Z91018 Allergy to other foods: Secondary | ICD-10-CM

## 2021-10-26 LAB — POCT GLYCOSYLATED HEMOGLOBIN (HGB A1C): HbA1c, POC (controlled diabetic range): 6.6 % (ref 0.0–7.0)

## 2021-10-26 MED ORDER — LISINOPRIL 20 MG PO TABS
20.0000 mg | ORAL_TABLET | Freq: Every day | ORAL | 6 refills | Status: DC
  Filled 2021-10-26: qty 30, fill #0
  Filled 2021-12-01: qty 30, 30d supply, fill #0
  Filled 2022-01-03: qty 30, 30d supply, fill #1
  Filled 2022-02-08: qty 30, 30d supply, fill #2
  Filled 2022-03-16: qty 30, 30d supply, fill #3
  Filled 2022-04-28: qty 30, 30d supply, fill #4
  Filled 2022-06-09: qty 30, 30d supply, fill #5
  Filled 2022-07-14: qty 30, 30d supply, fill #6

## 2021-10-26 MED ORDER — ATORVASTATIN CALCIUM 40 MG PO TABS
40.0000 mg | ORAL_TABLET | Freq: Every day | ORAL | 6 refills | Status: DC
  Filled 2021-10-26: qty 30, fill #0
  Filled 2021-12-01: qty 30, 30d supply, fill #0
  Filled 2022-01-03: qty 30, 30d supply, fill #1
  Filled 2022-02-08: qty 30, 30d supply, fill #2
  Filled 2022-03-16: qty 30, 30d supply, fill #3
  Filled 2022-04-28: qty 30, 30d supply, fill #4
  Filled 2022-06-09: qty 30, 30d supply, fill #5
  Filled 2022-07-14: qty 30, 30d supply, fill #6

## 2021-10-26 MED ORDER — METFORMIN HCL ER 500 MG PO TB24
1000.0000 mg | ORAL_TABLET | Freq: Every day | ORAL | 6 refills | Status: DC
  Filled 2021-10-26: qty 60, fill #0
  Filled 2021-12-01: qty 60, 30d supply, fill #0
  Filled 2022-01-03: qty 60, 30d supply, fill #1
  Filled 2022-02-08: qty 60, 30d supply, fill #2
  Filled 2022-03-16: qty 60, 30d supply, fill #3
  Filled 2022-04-28: qty 60, 30d supply, fill #4
  Filled 2022-06-09: qty 60, 30d supply, fill #5
  Filled 2022-07-14: qty 60, 30d supply, fill #6

## 2021-10-26 MED ORDER — TAMSULOSIN HCL 0.4 MG PO CAPS
0.8000 mg | ORAL_CAPSULE | Freq: Every day | ORAL | 6 refills | Status: DC
  Filled 2021-10-26 – 2021-11-19 (×2): qty 60, 30d supply, fill #0
  Filled 2022-01-03: qty 60, 30d supply, fill #1
  Filled 2022-02-08: qty 60, 30d supply, fill #2
  Filled 2022-03-16: qty 60, 30d supply, fill #3
  Filled 2022-04-28: qty 60, 30d supply, fill #4
  Filled 2022-06-09: qty 60, 30d supply, fill #5
  Filled 2022-07-14: qty 60, 30d supply, fill #6

## 2021-10-26 MED ORDER — BUPROPION HCL ER (XL) 150 MG PO TB24
150.0000 mg | ORAL_TABLET | Freq: Every day | ORAL | 3 refills | Status: DC
  Filled 2021-10-26: qty 30, 30d supply, fill #0
  Filled 2021-12-01: qty 30, 30d supply, fill #1
  Filled 2022-01-03: qty 30, 30d supply, fill #2
  Filled 2022-02-08: qty 30, 30d supply, fill #3

## 2021-10-26 NOTE — Progress Notes (Signed)
Discuss flomax medication.

## 2021-10-26 NOTE — Patient Instructions (Signed)
Managing the Challenge of Quitting Smoking ?Quitting smoking is a physical and mental challenge. You will face cravings, withdrawal symptoms, and temptation. Before quitting, work with your health care provider to make a plan that can help you manage quitting. Preparation can help you quit and keep you from giving in. ?How to manage lifestyle changes ?Managing stress ?Stress can make you want to smoke, and wanting to smoke may cause stress. It is important to find ways to manage your stress. You might try some of the following: ?Practice relaxation techniques. ?Breathe slowly and deeply, in through your nose and out through your mouth. ?Listen to music. ?Soak in a bath or take a shower. ?Imagine a peaceful place or vacation. ?Get some support. ?Talk with family or friends about your stress. ?Join a support group. ?Talk with a counselor or therapist. ?Get some physical activity. ?Go for a walk, run, or bike ride. ?Play a favorite sport. ?Practice yoga. ? ?Medicines ?Talk with your health care provider about medicines that might help you deal with cravings and make quitting easier for you. ?Relationships ?Social situations can be difficult when you are quitting smoking. To manage this, you can: ?Avoid parties and other social situations where people might be smoking. ?Avoid alcohol. ?Leave right away if you have the urge to smoke. ?Explain to your family and friends that you are quitting smoking. Ask for support and let them know you might be a bit grumpy. ?Plan activities where smoking is not an option. ?General instructions ?Be aware that many people gain weight after they quit smoking. However, not everyone does. To keep from gaining weight, have a plan in place before you quit and stick to the plan after you quit. Your plan should include: ?Having healthy snacks. When you have a craving, it may help to: ?Eat popcorn, carrots, celery, or other cut vegetables. ?Chew sugar-free gum. ?Changing how you eat. ?Eat small  portion sizes at meals. ?Eat 4-6 small meals throughout the day instead of 1-2 large meals a day. ?Be mindful when you eat. Do not watch television or do other things that might distract you as you eat. ?Exercising regularly. ?Make time to exercise each day. If you do not have time for a long workout, do short bouts of exercise for 5-10 minutes several times a day. ?Do some form of strengthening exercise, such as weight lifting. ?Do some exercise that gets your heart beating and causes you to breathe deeply, such as walking fast, running, swimming, or biking. This is very important. ?Drinking plenty of water or other low-calorie or no-calorie drinks. Drink 6-8 glasses of water daily. ? ?How to recognize withdrawal symptoms ?Your body and mind may experience discomfort as you try to get used to not having nicotine in your system. These effects are called withdrawal symptoms. They may include: ?Feeling hungrier than normal. ?Having trouble concentrating. ?Feeling irritable or restless. ?Having trouble sleeping. ?Feeling depressed. ?Craving a cigarette. ?To manage withdrawal symptoms: ?Avoid places, people, and activities that trigger your cravings. ?Remember why you want to quit. ?Get plenty of sleep. ?Avoid coffee and other caffeinated drinks. These may worsen some of your symptoms. ?These symptoms may surprise you. But be assured that they are normal to have when quitting smoking. ?How to manage cravings ?Come up with a plan for how to deal with your cravings. The plan should include the following: ?A definition of the specific situation you want to deal with. ?An alternative action you will take. ?A clear idea for how this action   will help. ?The name of someone who might help you with this. ?Cravings usually last for 5-10 minutes. Consider taking the following actions to help you with your plan to deal with cravings: ?Keep your mouth busy. ?Chew sugar-free gum. ?Suck on hard candies or a straw. ?Brush your  teeth. ?Keep your hands and body busy. ?Change to a different activity right away. ?Squeeze or play with a ball. ?Do an activity or a hobby, such as making bead jewelry, practicing needlepoint, or working with wood. ?Mix up your normal routine. ?Take a short exercise break. Go for a quick walk or run up and down stairs. ?Focus on doing something kind or helpful for someone else. ?Call a friend or family member to talk during a craving. ?Join a support group. ?Contact a quitline. ?Where to find support ?To get help or find a support group: ?Call the National Cancer Institute's Smoking Quitline: 1-800-QUIT NOW (784-8669) ?Visit the website of the Substance Abuse and Mental Health Services Administration: www.samhsa.gov ?Text QUIT to SmokefreeTXT: 478848 ?Where to find more information ?Visit these websites to find more information on quitting smoking: ?National Cancer Institute: www.smokefree.gov ?American Lung Association: www.lung.org ?American Cancer Society: www.cancer.org ?Centers for Disease Control and Prevention: www.cdc.gov ?American Heart Association: www.heart.org ?Contact a health care provider if: ?You want to change your plan for quitting. ?The medicines you are taking are not helping. ?Your eating feels out of control or you cannot sleep. ?Get help right away if: ?You feel depressed or become very anxious. ?Summary ?Quitting smoking is a physical and mental challenge. You will face cravings, withdrawal symptoms, and temptation to smoke again. Preparation can help you as you go through these challenges. ?Try different techniques to manage stress, handle social situations, and prevent weight gain. ?You can deal with cravings by keeping your mouth busy (such as by chewing gum), keeping your hands and body busy, calling family or friends, or contacting a quitline for people who want to quit smoking. ?You can deal with withdrawal symptoms by avoiding places where people smoke, getting plenty of rest, and  avoiding drinks with caffeine. ?This information is not intended to replace advice given to you by your health care provider. Make sure you discuss any questions you have with your health care provider. ?Document Revised: 05/28/2021 Document Reviewed: 07/09/2019 ?Elsevier Patient Education ? 2022 [employer] ? ?

## 2021-10-26 NOTE — Progress Notes (Signed)
Subjective:  Patient ID: Dalton Arnold, male    DOB: 05/08/1966  Age: 56 y.o. MRN: [id number]  CC: Diabetes   HPI Dalton Arnold is a 56 y.o. year old male with a history of type 2 diabetes mellitus (A1c 6.4), hypertension, thyroid adenocarcinoma diagnosed in 08/2014 (status post left lobectomy, last seen by endocrine in 04/2016 and declined complete lobectomy and radioactive iodine therapy) who presents today for a follow-up visit.  Interval History: He complains of nocturia, urgency but no daytime frequency. When he drinks beer or tea  he has daytime urinary frequency. He has no straining or sense of incomplete emptying.He is currently on Flomax 0.59m.  When he eats chocolate he feels a prickly sensation which is generalized. He had no pruritus, no hives, no dyspnea.  Doing well on his antihypertensives and Statin and has no adverse effect from his medications.   Past Medical History:  Diagnosis Date   Diabetes mellitus without complication ([place]    on meds   Diarrhea    Hyperlipidemia    on meds   Hypertension    on meds   Thyroid mass     Past Surgical History:  Procedure Laterality Date   COLONOSCOPY  2019   HD-MAC-suprep(good)-TA   ESOPHAGOGASTRODUODENOSCOPY N/A 06/27/2014   Procedure: ESOPHAGOGASTRODUODENOSCOPY (EGD);  Surgeon: PBeryle Beams MD;  [facility];  Service: Endoscopy;  Laterality: N/A;   INGUINAL HERNIA REPAIR Left    INGUINAL HERNIA REPAIR Right    NASAL FRACTURE SURGERY  1999   not broken- had extra bones/tissue to be removed   THYROIDECTOMY Left 08/19/2014   Procedure: LEFT THYROID LOBECTOMY;  Surgeon: CRozetta Nunnery MD;  Location: [facility];  Service: ENT;  Laterality: Left;   UMBILICAL HERNIA REPAIR      Family History  Problem Relation Age of Onset   Diabetes Mother    Healthy Mother    Healthy Father    Thyroid disease Neg Hx    Colon cancer Neg Hx    Colon polyps Neg Hx    Esophageal cancer Neg Hx    Rectal cancer  Neg Hx    Stomach cancer Neg Hx     No Known Allergies  Outpatient Medications Prior to Visit  Medication Sig Dispense Refill   Blood Glucose Monitoring Suppl (CONTOUR NEXT MONITOR) w/Device KIT 1 each by Does not apply route daily. 1 kit 0   glucose blood (CONTOUR NEXT TEST) test strip Use as instructed daily 30 each 12   Lancets Thin MISC 1 each by Does not apply route daily. 30 each 12   polyethylene glycol-electrolytes (NULYTELY) 420 g solution Take 4,000 mLs by mouth once for 1 dose. 4000 mL 0   atorvastatin (LIPITOR) 40 MG tablet TAKE 1 TABLET (40 MG TOTAL) BY MOUTH DAILY. 30 tablet 6   lisinopril (ZESTRIL) 20 MG tablet TAKE 1 TABLET (20 MG TOTAL) BY MOUTH DAILY. 30 tablet 6   metFORMIN (GLUCOPHAGE-XR) 500 MG 24 hr tablet TAKE 2 TABLETS (1,000 MG TOTAL) BY MOUTH DAILY WITH BREAKFAST. 60 tablet 6   tamsulosin (FLOMAX) 0.4 MG CAPS capsule TAKE 1 CAPSULE (0.4 MG TOTAL) BY MOUTH DAILY. 30 capsule 6   Facility-Administered Medications Prior to Visit  Medication Dose Route Frequency Provider Last Rate Last Admin   0.9 %  sodium chloride infusion  500 mL Intravenous Once Danis, HKirke Corin MD         ROS Review of Systems  Constitutional:  Negative for activity  change and appetite change.  HENT:  Negative for sinus pressure and sore throat.   Eyes:  Negative for visual disturbance.  Respiratory:  Negative for cough, chest tightness and shortness of breath.   Cardiovascular:  Negative for chest pain and leg swelling.  Gastrointestinal:  Negative for abdominal distention, abdominal pain, constipation and diarrhea.  Endocrine: Negative.   Genitourinary:  Negative for dysuria.  Musculoskeletal:  Negative for joint swelling and myalgias.  Skin:  Negative for rash.  Allergic/Immunologic: Negative.   Neurological:  Negative for weakness, light-headedness and numbness.  Psychiatric/Behavioral:  Negative for dysphoric mood and suicidal ideas.    Objective:  BP 131/86    Pulse 75    Ht  _0  (1.702 m)    Wt 213 lb 6.4 oz (96.8 kg)    SpO2 97%    BMI 33.42 kg/m   BP/Weight 10/26/2021 10/20/2021 1/96/2229  Systolic BP 798 - 921  Diastolic BP 86 - 77  Wt. (Lbs) 213.4 200 205.2  BMI 33.42 31.32 32.14      Physical Exam Constitutional:      Appearance: He is well-developed.  Cardiovascular:     Rate and Rhythm: Normal rate.     Heart sounds: Normal heart sounds. No murmur heard. Pulmonary:     Effort: Pulmonary effort is normal.     Breath sounds: Normal breath sounds. No wheezing or rales.  Chest:     Chest wall: No tenderness.  Abdominal:     General: Bowel sounds are normal. There is no distension.     Palpations: Abdomen is soft. There is no mass.     Tenderness: There is no abdominal tenderness.  Musculoskeletal:        General: Normal range of motion.     Right lower leg: No edema.     Left lower leg: No edema.  Neurological:     Mental Status: He is alert and oriented to person, place, and time.  Psychiatric:        Mood and Affect: Mood normal.    CMP Latest Ref Rng & Units 03/29/2021 11/26/2020 01/14/2020  Glucose 65 - 99 mg/dL 93 172(H) 113(H)  BUN 6 - 24 mg/dL _1 Creatinine 0.76 - 1.27 mg/dL 0.81 0.73(L) 0.92  Sodium 134 - 144 mmol/L 141 137 140  Potassium 3.5 - 5.2 mmol/L 4.8 4.4 5.0  Chloride 96 - 106 mmol/L 105 101 104  CO2 20 - 29 mmol/L _2 Calcium 8.7 - 10.2 mg/dL 9.1 9.6 9.4  Total Protein 6.0 - 8.5 g/dL - 6.8 7.0  Total Bilirubin 0.0 - 1.2 mg/dL - 0.3 <0.2  Alkaline Phos 44 - 121 IU/L - 91 109  AST 0 - 40 IU/L - 22 24  ALT 0 - 44 IU/L - 33 33    Lipid Panel     Component Value Date/Time   CHOL 177 11/26/2020 1135   TRIG 239 (H) 11/26/2020 1135   HDL 33 (L) 11/26/2020 1135   CHOLHDL 5.4 (H) 11/26/2020 1135   CHOLHDL 6.2 (H) 04/11/2016 1009   VLDL 30 04/11/2016 1009   LDLCALC 103 (H) 11/26/2020 1135    CBC    Component Value Date/Time   WBC 11.7 (H) 11/26/2020 1135   WBC 11.8 (H) 07/19/2019 1705   RBC 5.80  11/26/2020 1135   RBC 5.87 (H) 07/19/2019 1705   HGB 15.2 11/26/2020 1135   HCT 46.5 11/26/2020 1135   PLT 376 11/26/2020 1135   MCV  80 11/26/2020 1135   MCH 26.2 (L) 11/26/2020 1135   MCH 25.2 (L) 07/19/2019 1705   MCHC 32.7 11/26/2020 1135   MCHC 33.3 07/19/2019 1705   RDW 14.9 11/26/2020 1135   LYMPHSABS 2.8 11/26/2020 1135   MONOABS 0.8 07/19/2019 1705   EOSABS 0.5 (H) 11/26/2020 1135   BASOSABS 0.1 11/26/2020 1135    Lab Results  Component Value Date   HGBA1C 6.6 10/26/2021    The 10-year ASCVD risk score (Arnett DK, et al., 2019) is: 30.4%   Values used to calculate the score:     Age: 8 years     Sex: Male     Is Non-Hispanic African American: No     Diabetic: Yes     Tobacco smoker: Yes     Systolic Blood Pressure: 235 mmHg     Is BP treated: Yes     HDL Cholesterol: 33 mg/dL     Total Cholesterol: 177 mg/dL  Assessment & Plan:  1. Diabetes mellitus type 2 in obese ([place]) Controlled with A1c of 6.6 Continue current regimen Counseled on Diabetic diet, my plate method, 361 minutes of moderate intensity exercise/week Blood sugar logs with fasting goals of 80-120 mg/dl, random of less than 180 and in the event of sugars less than 60 mg/dl or greater than 400 mg/dl encouraged to notify the clinic. Advised on the need for annual eye exams, annual foot exams, Pneumonia vaccine. - POCT glycosylated hemoglobin (Hb A1C) - metFORMIN (GLUCOPHAGE-XR) 500 MG 24 hr tablet; Take 2 tablets (1,000 mg total) by mouth daily with breakfast.  Dispense: 60 tablet; Refill: 6 - Microalbumin / creatinine urine ratio; Future - LP+Non-HDL Cholesterol; Future - CMP14+EGFR; Future  2. Lower urinary tract symptoms Uncontrolled Increase Flomax from 0.4 to 0.8 mg - tamsulosin (FLOMAX) 0.4 MG CAPS capsule; Take 2 capsules (0.8 mg total) by mouth daily.  Dispense: 60 capsule; Refill: 6  3. HTN (hypertension), benign Controlled Counseled on blood pressure goal of less than 130/80,  low-sodium, DASH diet, medication compliance, 150 minutes of moderate intensity exercise per week. Discussed medication compliance, adverse effects. - lisinopril (ZESTRIL) 20 MG tablet; Take 1 tablet (20 mg total) by mouth daily.  Dispense: 30 tablet; Refill: 6  4. Hyperlipidemia associated with type 2 diabetes mellitus ([facility]) 10-year ASCVD risk of 30.4% We will repeat lipid panel today Low-cholesterol diet - atorvastatin (LIPITOR) 40 MG tablet; Take 1 tablet (40 mg total) by mouth daily.  Dispense: 30 tablet; Refill: 6  5. Food allergy He thinks he might have a chocolate allergy Will send labs off - Food Allergy Profile; Future - Chocolate IgE; Future  6. Need for immunization against influenza - Flu Vaccine QUAD 31moIM (Fluarix, Fluzone & Alfiuria Quad PF)  7. Tobacco use disorder Smoking cessation support: smoking cessation hotline: 1-800-QUIT-NOW.  Smoking cessation classes are available through [facility]. Call 3(208)[phone] visit our website at [email or web address]  Spent 3 minutes counseling on dangers of tobacco use and benefits of quitting, offered pharmacological intervention to aid quitting and patient is ready to quit. Commence Wellbutrin  - buPROPion (WELLBUTRIN XL) 150 MG 24 hr tablet; Take 1 tablet (150 mg total) by mouth daily For smoking cessation  Dispense: 30 tablet; Refill: 3   Health Care Maintenance: Colonoscopy comes up next month Meds ordered this encounter  Medications   tamsulosin (FLOMAX) 0.4 MG CAPS capsule    Sig: Take 2 capsules (0.8 mg total) by mouth daily.    Dispense:  60 capsule    Refill:  6    Dose increase   metFORMIN (GLUCOPHAGE-XR) 500 MG 24 hr tablet    Sig: Take 2 tablets (1,000 mg total) by mouth daily with breakfast.    Dispense:  60 tablet    Refill:  6   lisinopril (ZESTRIL) 20 MG tablet    Sig: Take 1 tablet (20 mg total) by mouth daily.    Dispense:  30 tablet    Refill:  6   atorvastatin (LIPITOR)  40 MG tablet    Sig: Take 1 tablet (40 mg total) by mouth daily.    Dispense:  30 tablet    Refill:  6   buPROPion (WELLBUTRIN XL) 150 MG 24 hr tablet    Sig: Take 1 tablet (150 mg total) by mouth daily For smoking cessation    Dispense:  30 tablet    Refill:  3    Follow-up: Return in about 6 months (around 04/25/2022) for Chronic medical conditions.       Charlott Rakes, MD, FAAFP. [facility] and [place], [place]   10/26/2021, 12:14 PM

## 2021-10-28 ENCOUNTER — Other Ambulatory Visit (HOSPITAL_COMMUNITY): Payer: Self-pay

## 2021-10-28 ENCOUNTER — Encounter: Payer: Self-pay | Admitting: Gastroenterology

## 2021-10-29 ENCOUNTER — Ambulatory Visit: Payer: 59 | Attending: Family Medicine

## 2021-10-29 ENCOUNTER — Other Ambulatory Visit: Payer: Self-pay

## 2021-10-29 DIAGNOSIS — E1169 Type 2 diabetes mellitus with other specified complication: Secondary | ICD-10-CM

## 2021-10-29 DIAGNOSIS — Z91018 Allergy to other foods: Secondary | ICD-10-CM

## 2021-10-29 DIAGNOSIS — E669 Obesity, unspecified: Secondary | ICD-10-CM

## 2021-11-02 ENCOUNTER — Telehealth: Payer: Self-pay

## 2021-11-02 ENCOUNTER — Encounter: Payer: Self-pay | Admitting: Certified Registered Nurse Anesthetist

## 2021-11-02 NOTE — Telephone Encounter (Addendum)
Spoke with MD, and pt and let patient know of MD recommendations of stopping solid food, and begin prep early and to stay on clear liquids for the rest of the day. Pt verbalized understanding and had no further concerns at the end of the call.

## 2021-11-02 NOTE — Telephone Encounter (Signed)
Hi Dr. Loletha Carrow,  This patient is scheduled to have a procedure on 11/03/21 at 9:30. He stated stated that he ate eggs, ground beef at 11am today to take his medication. Wants to know if he can still proceed with procedure tomorrow?

## 2021-11-03 ENCOUNTER — Encounter: Payer: Self-pay | Admitting: Gastroenterology

## 2021-11-03 ENCOUNTER — Ambulatory Visit (AMBULATORY_SURGERY_CENTER): Payer: 59 | Admitting: Gastroenterology

## 2021-11-03 VITALS — BP 94/67 | HR 68 | Temp 96.8°F | Resp 16 | Ht 67.0 in | Wt 200.0 lb

## 2021-11-03 DIAGNOSIS — Z86010 Personal history of colonic polyps: Secondary | ICD-10-CM

## 2021-11-03 LAB — LP+NON-HDL CHOLESTEROL
Cholesterol, Total: 161 mg/dL (ref 100–199)
HDL: 34 mg/dL — ABNORMAL LOW (ref >39)
LDL Chol Calc (NIH): 101 mg/dL — ABNORMAL HIGH (ref 0–99)
Total Non-HDL-Chol (LDL+VLDL): 127 mg/dL (ref 0–129)
Triglycerides: 146 mg/dL (ref 0–149)
VLDL Cholesterol Cal: 26 mg/dL (ref 5–40)

## 2021-11-03 LAB — FOOD ALLERGY PROFILE
Allergen Corn, IgE: <0.1 kU/L
Clam IgE: <0.1 kU/L
Codfish IgE: <0.1 kU/L
Egg White IgE: <0.1 kU/L
Milk IgE: <0.1 kU/L
Peanut IgE: <0.1 kU/L
Scallop IgE: <0.1 kU/L
Sesame Seed IgE: <0.1 kU/L
Shrimp IgE: <0.1 kU/L
Soybean IgE: <0.1 kU/L
Walnut IgE: <0.1 kU/L
Wheat IgE: <0.1 kU/L

## 2021-11-03 LAB — CMP14+EGFR
ALT: 22 IU/L (ref 0–44)
AST: 18 IU/L (ref 0–40)
Albumin/Globulin Ratio: 1.6 (ref 1.2–2.2)
Albumin: 4.5 g/dL (ref 3.8–4.9)
Alkaline Phosphatase: 91 IU/L (ref 44–121)
BUN/Creatinine Ratio: 24 — ABNORMAL HIGH (ref 9–20)
BUN: 22 mg/dL (ref 6–24)
Bilirubin Total: 0.3 mg/dL (ref 0.0–1.2)
CO2: 26 mmol/L (ref 20–29)
Calcium: 9.1 mg/dL (ref 8.7–10.2)
Chloride: 103 mmol/L (ref 96–106)
Creatinine, Ser: 0.92 mg/dL (ref 0.76–1.27)
Globulin, Total: 2.8 g/dL (ref 1.5–4.5)
Glucose: 121 mg/dL — ABNORMAL HIGH (ref 70–99)
Potassium: 4.9 mmol/L (ref 3.5–5.2)
Sodium: 140 mmol/L (ref 134–144)
Total Protein: 7.3 g/dL (ref 6.0–8.5)
eGFR: 97 mL/min/{1.73_m2} (ref >59)

## 2021-11-03 LAB — MICROALBUMIN / CREATININE URINE RATIO
Creatinine, Urine: 113.1 mg/dL
Microalb/Creat Ratio: 6 mg/g creat (ref 0–29)
Microalbumin, Urine: 7.3 ug/mL

## 2021-11-03 LAB — ALLERGEN CHOCOLATE: Chocolate/Cacao IgE: <0.1 kU/L

## 2021-11-03 MED ORDER — SODIUM CHLORIDE 0.9 % IV SOLN: 500.0000 mL | Freq: Once | INTRAVENOUS | Status: DC

## 2021-11-03 NOTE — Patient Instructions (Signed)
Thank you for allowing [place] to care for you today. Return to normal daily activities tomorrow, 10/04/21 Recommend next colonoscopy in 10 years.     YOU HAD AN ENDOSCOPIC PROCEDURE TODAY AT [facility]:   Refer to the procedure report that was given to you for any specific questions about what was found during the examination.  If the procedure report does not answer your questions, please call your gastroenterologist to clarify.  If you requested that your care partner not be given the details of your procedure findings, then the procedure report has been included in a sealed envelope for you to review at your convenience later.  YOU SHOULD EXPECT: Some feelings of bloating in the abdomen. Passage of more gas than usual.  Walking can help get rid of the air that was put into your GI tract during the procedure and reduce the bloating. If you had a lower endoscopy (such as a colonoscopy or flexible sigmoidoscopy) you may notice spotting of blood in your stool or on the toilet paper. If you underwent a bowel prep for your procedure, you may not have a normal bowel movement for a few days.  Please Note:  You might notice some irritation and congestion in your nose or some drainage.  This is from the oxygen used during your procedure.  There is no need for concern and it should clear up in a day or so.  SYMPTOMS TO REPORT IMMEDIATELY:  Following lower endoscopy (colonoscopy or flexible sigmoidoscopy):  Excessive amounts of blood in the stool  Significant tenderness or worsening of abdominal pains  Swelling of the abdomen that is new, acute  Fever of 100F or higher    For urgent or emergent issues, a gastroenterologist can be reached at any hour by calling [phone]. Do not use MyChart messaging for urgent concerns.    DIET:  We do recommend a small meal at first, but then you may proceed to your regular diet.  Drink plenty of fluids but you should avoid alcoholic beverages  for 24 hours.  ACTIVITY:  You should plan to take it easy for the rest of today and you should NOT DRIVE or use heavy machinery until tomorrow (because of the sedation medicines used during the test).    FOLLOW UP: Our staff will call the number listed on your records 48-72 hours following your procedure to check on you and address any questions or concerns that you may have regarding the information given to you following your procedure. If we do not reach you, we will leave a message.  We will attempt to reach you two times.  During this call, we will ask if you have developed any symptoms of COVID 19. If you develop any symptoms (ie: fever, flu-like symptoms, shortness of breath, cough etc.) before then, please call [phone].  If you test positive for Covid 19 in the 2 weeks post procedure, please call and report this information to [place].    If any biopsies were taken you will be contacted by phone or by letter within the next 1-3 weeks.  Please call us at [phone] if you have not heard about the biopsies in 3 weeks.    SIGNATURES/CONFIDENTIALITY: You and/or your care partner have signed paperwork which will be entered into your electronic medical record.  These signatures attest to the fact that that the information above on your After Visit Summary has been reviewed and is understood.  Full responsibility of the confidentiality of  this discharge information lies with you and/or your care-partner.

## 2021-11-03 NOTE — Progress Notes (Signed)
History and Physical:  This patient presents for endoscopic testing for: Encounter Diagnosis  Name Primary?   Personal history of colonic polyps Yes    TA polyps 10/2017 Patient denies chronic abdominal pain, rectal bleeding, constipation or diarrhea.   ROS: Patient denies chest pain or cough   Past Medical History: Past Medical History:  Diagnosis Date   Diabetes mellitus without complication ([place])    on meds   Diarrhea    Hyperlipidemia    on meds   Hypertension    on meds   Thyroid mass      Past Surgical History: Past Surgical History:  Procedure Laterality Date   COLONOSCOPY  2019   HD-MAC-suprep(good)-TA   ESOPHAGOGASTRODUODENOSCOPY N/A 06/27/2014   Procedure: ESOPHAGOGASTRODUODENOSCOPY (EGD);  Surgeon: Beryle Beams, MD;  [facility];  Service: Endoscopy;  Laterality: N/A;   INGUINAL HERNIA REPAIR Left    INGUINAL HERNIA REPAIR Right    NASAL FRACTURE SURGERY  1999   not broken- had extra bones/tissue to be removed   THYROIDECTOMY Left 08/19/2014   Procedure: LEFT THYROID LOBECTOMY;  Surgeon: Rozetta Nunnery, MD;  Location: [facility];  Service: ENT;  Laterality: Left;   UMBILICAL HERNIA REPAIR      Allergies: No Known Allergies  Outpatient Meds: Current Outpatient Medications  Medication Sig Dispense Refill   atorvastatin (LIPITOR) 40 MG tablet Take 1 tablet (40 mg total) by mouth daily. 30 tablet 6   Blood Glucose Monitoring Suppl (CONTOUR NEXT MONITOR) w/Device KIT 1 each by Does not apply route daily. 1 kit 0   buPROPion (WELLBUTRIN XL) 150 MG 24 hr tablet Take 1 tablet (150 mg total) by mouth daily For smoking cessation 30 tablet 3   glucose blood (CONTOUR NEXT TEST) test strip Use as instructed daily 30 each 12   Lancets Thin MISC 1 each by Does not apply route daily. 30 each 12   lisinopril (ZESTRIL) 20 MG tablet Take 1 tablet (20 mg total) by mouth daily. 30 tablet 6   metFORMIN (GLUCOPHAGE-XR) 500 MG 24 hr tablet Take 2 tablets  (1,000 mg total) by mouth daily with breakfast. 60 tablet 6   tamsulosin (FLOMAX) 0.4 MG CAPS capsule Take 2 capsules (0.8 mg total) by mouth daily. 60 capsule 6   Current Facility-Administered Medications  Medication Dose Route Frequency Provider Last Rate Last Admin   0.9 %  sodium chloride infusion  500 mL Intravenous Once Danis, Estill Cotta III, MD       0.9 %  sodium chloride infusion  500 mL Intravenous Once Doran Stabler, MD          ___________________________________________________________________ Objective   Exam:  BP (!) 124/91    Pulse 72    Temp (!) 96.8 F (36 C)    Resp 16    Ht _0  (1.702 m)    Wt 200 lb (90.7 kg)    SpO2 98%    BMI 31.32 kg/m   CV: RRR without murmur, S1/S2 Resp: clear to auscultation bilaterally, normal RR and effort noted GI: soft, no tenderness, with active bowel sounds.   Assessment: Encounter Diagnosis  Name Primary?   Personal history of colonic polyps Yes     Plan: Colonoscopy  The benefits and risks of the planned procedure were described in detail with the patient or (when appropriate) their health care proxy.  Risks were outlined as including, but not limited to, bleeding, infection, perforation, adverse medication reaction leading to cardiac or pulmonary decompensation,  pancreatitis (if ERCP).  The limitation of incomplete mucosal visualization was also discussed.  No guarantees or warranties were given.    The patient is appropriate for an endoscopic procedure in the ambulatory setting.   - Wilfrid Lund, MD

## 2021-11-03 NOTE — Op Note (Addendum)
[facility] Patient Name: Dalton Arnold Procedure Date: 11/03/2021 9:46 AM MRN: [id number] Endoscopist: Mallie Mussel L. Loletha Carrow , MD Age: 56 Referring MD:  Date of Birth: October 06, 1965 Gender: Male Account #: [id number] Procedure:                Colonoscopy Indications:              Surveillance: Personal history of adenomatous                            polyps on last colonoscopy > 3 years ago Medicines:                Monitored Anesthesia Care Procedure:                Pre-Anesthesia Assessment:                           - Prior to the procedure, a History and Physical                            was performed, and patient medications and                            allergies were reviewed. The patient's tolerance of                            previous anesthesia was also reviewed. The risks                            and benefits of the procedure and the sedation                            options and risks were discussed with the patient.                            All questions were answered, and informed consent                            was obtained. Prior Anticoagulants: The patient has                            taken no previous anticoagulant or antiplatelet                            agents. ASA Grade Assessment: II - A patient with                            mild systemic disease. After reviewing the risks                            and benefits, the patient was deemed in                            satisfactory condition to undergo the procedure.  After obtaining informed consent, the colonoscope                            was passed under direct vision. Throughout the                            procedure, the patient's blood pressure, pulse, and                            oxygen saturations were monitored continuously. The                            CF [id number] #[id number] was introduced through the anus                            and advanced to the the  cecum, identified by                            appendiceal orifice and ileocecal valve. The                            colonoscopy was performed without difficulty. The                            patient tolerated the procedure well. The quality                            of the bowel preparation was excellent with lavage.                            The ileocecal valve, appendiceal orifice, and                            rectum were photographed. The bowel preparation                            used was GoLYTELY. Scope In: 9:55:13 AM Scope Out: 10:06:30 AM Scope Withdrawal Time: 0 hours 9 minutes 26 seconds  Total Procedure Duration: 0 hours 11 minutes 17 seconds  Findings:                 A 2-40mm firm, benign-appearing, right anterior skin                            tag was found on perianal exam.                           Repeat examination of right colon under NBI                            performed.                           Multiple diverticula were found in the left colon  and right colon.                           There is no endoscopic evidence of polyps in the                            entire colon.                           Internal hemorrhoids were found.                           The exam was otherwise without abnormality on                            direct and retroflexion views. Complications:            No immediate complications. Estimated Blood Loss:     Estimated blood loss: none. Impression:               - Perianal skin tags found on perianal exam.                           - Diverticulosis in the left colon and in the right                            colon.                           - Internal hemorrhoids.                           - The examination was otherwise normal on direct                            and retroflexion views.                           - No specimens collected. Recommendation:           - Patient has a contact  number available for                            emergencies. The signs and symptoms of potential                            delayed complications were discussed with the                            patient. Return to normal activities tomorrow.                            Written discharge instructions were provided to the                            patient.                           -  Resume previous diet.                           - Continue present medications.                           - Repeat colonoscopy in 10 years for surveillance.                           - Referral to colorectal surgery if patient if skin                            tag is painful or irritating. Stormey Wilborn L. Loletha Carrow, MD 11/03/2021 10:14:06 AM This report has been signed electronically.

## 2021-11-03 NOTE — Progress Notes (Signed)
Report given to PACU, vss 

## 2021-11-03 NOTE — Progress Notes (Signed)
Pt's states no medical or surgical changes since previsit or office visit.   VS taken by AS

## 2021-11-04 ENCOUNTER — Telehealth: Payer: Self-pay

## 2021-11-04 NOTE — Telephone Encounter (Signed)
-----   Message from Charlott Rakes, MD sent at 11/02/2021  3:07 PM EST ----- Kidney and liver function, cholesterol are normal.  Allergy test is still pending.

## 2021-11-04 NOTE — Telephone Encounter (Signed)
Patient name and DOB has been verified Patient was informed of lab results. Patient had no questions.  

## 2021-11-04 NOTE — Telephone Encounter (Signed)
-----   Message from Charlott Rakes, MD sent at 11/03/2021  8:42 AM EST ----- Food allergy panel and chart cleared allergy test are negative.

## 2021-11-05 ENCOUNTER — Telehealth: Payer: Self-pay | Admitting: *Deleted

## 2021-11-05 ENCOUNTER — Telehealth: Payer: Self-pay

## 2021-11-05 NOTE — Telephone Encounter (Signed)
Called [phone] and left a message we tried to reach pt for a follow up call. maw

## 2021-11-05 NOTE — Telephone Encounter (Signed)
°  Follow up Call-  Call back number 11/03/2021  Post procedure Call Back phone  # [phone]  Permission to leave phone message Yes  Some recent data might be hidden     Patient questions:  Do you have a fever, pain , or abdominal swelling? No. Pain Score  0 *  Have you tolerated food without any problems? Yes.    Have you been able to return to your normal activities? Yes.    Do you have any questions about your discharge instructions: Diet   No. Medications  No. Follow up visit  No.  Do you have questions or concerns about your Care? No.  Actions: * If pain score is 4 or above: No action needed, pain <4.  Have you developed a fever since your procedure? no  2.   Have you had an respiratory symptoms (SOB or cough) since your procedure? no  3.   Have you tested positive for COVID 19 since your procedure no  4.   Have you had any family members/close contacts diagnosed with the COVID 19 since your procedure?  no   If yes to any of these questions please route to Joylene John, RN and Joella Prince, RN

## 2021-11-19 ENCOUNTER — Other Ambulatory Visit (HOSPITAL_COMMUNITY): Payer: Self-pay

## 2021-12-01 ENCOUNTER — Other Ambulatory Visit (HOSPITAL_COMMUNITY): Payer: Self-pay

## 2022-01-03 ENCOUNTER — Other Ambulatory Visit (HOSPITAL_COMMUNITY): Payer: Self-pay

## 2022-02-08 ENCOUNTER — Other Ambulatory Visit (HOSPITAL_COMMUNITY): Payer: Self-pay

## 2022-03-16 ENCOUNTER — Other Ambulatory Visit: Payer: Self-pay | Admitting: Family Medicine

## 2022-03-16 ENCOUNTER — Other Ambulatory Visit (HOSPITAL_COMMUNITY): Payer: Self-pay

## 2022-03-16 DIAGNOSIS — F17200 Nicotine dependence, unspecified, uncomplicated: Secondary | ICD-10-CM

## 2022-03-16 MED ORDER — BUPROPION HCL ER (XL) 150 MG PO TB24
150.0000 mg | ORAL_TABLET | Freq: Every day | ORAL | 2 refills | Status: DC
  Filled 2022-03-16: qty 30, 30d supply, fill #0
  Filled 2022-04-27: qty 30, 30d supply, fill #1
  Filled 2022-06-09: qty 30, 30d supply, fill #2

## 2022-03-16 NOTE — Telephone Encounter (Signed)
Requested Prescriptions  Pending Prescriptions Disp Refills  . buPROPion (WELLBUTRIN XL) 150 MG 24 hr tablet 30 tablet 2    Sig: Take 1 tablet (150 mg total) by mouth daily For smoking cessation     Psychiatry: Antidepressants - bupropion Passed - 03/16/2022 10:01 AM      Passed - Cr in normal range and within 360 days    Creat  Date Value Ref Range Status  04/11/2016 0.84 0.70 - 1.33 mg/dL Final    Comment:      For patients > or = 58 years of age: The upper reference limit for Creatinine is approximately 13% higher for people identified as African-American.      Creatinine, Ser  Date Value Ref Range Status  10/29/2021 0.92 0.76 - 1.27 mg/dL Final   Creatinine, Urine  Date Value Ref Range Status  06/20/2016 32 20 - 370 mg/dL Final         Passed - AST in normal range and within 360 days    AST  Date Value Ref Range Status  10/29/2021 18 0 - 40 IU/L Final         Passed - ALT in normal range and within 360 days    ALT  Date Value Ref Range Status  10/29/2021 22 0 - 44 IU/L Final         Passed - Last BP in normal range    BP Readings from Last 1 Encounters:  11/03/21 94/67         Passed - Valid encounter within last 6 months    Recent Outpatient Visits          4 months ago Diabetes mellitus type 2 in obese [facility])   [place], [place], MD   11 months ago Diabetes mellitus type 2 in obese [facility])   [place], Charlane Ferretti, MD   1 year ago Diabetes mellitus type 2 in obese [facility])   [place], [place], [place]   2 years ago Diabetes mellitus type 2 in obese [facility])   [place], Charlane Ferretti, MD   2 years ago Diabetes mellitus type 2 in obese [facility])   [place], En[place]ong, MD      Future Appointments            In 1 month Charlott Rakes, MD [place]

## 2022-04-25 ENCOUNTER — Ambulatory Visit: Payer: 59 | Admitting: Family Medicine

## 2022-04-27 ENCOUNTER — Other Ambulatory Visit (HOSPITAL_COMMUNITY): Payer: Self-pay

## 2022-04-28 ENCOUNTER — Other Ambulatory Visit (HOSPITAL_COMMUNITY): Payer: Self-pay

## 2022-04-29 ENCOUNTER — Other Ambulatory Visit (HOSPITAL_COMMUNITY): Payer: Self-pay

## 2022-06-02 ENCOUNTER — Other Ambulatory Visit (HOSPITAL_COMMUNITY): Payer: Self-pay

## 2022-06-09 ENCOUNTER — Other Ambulatory Visit (HOSPITAL_COMMUNITY): Payer: Self-pay

## 2022-07-14 ENCOUNTER — Other Ambulatory Visit (HOSPITAL_COMMUNITY): Payer: Self-pay

## 2022-08-08 ENCOUNTER — Other Ambulatory Visit (HOSPITAL_COMMUNITY): Payer: Self-pay

## 2022-08-08 ENCOUNTER — Ambulatory Visit: Payer: Commercial Managed Care - HMO | Attending: Family Medicine | Admitting: Family Medicine

## 2022-08-08 ENCOUNTER — Encounter: Payer: Self-pay | Admitting: Family Medicine

## 2022-08-08 VITALS — BP 117/74 | HR 85 | Ht 68.5 in | Wt 211.2 lb

## 2022-08-08 DIAGNOSIS — E785 Hyperlipidemia, unspecified: Secondary | ICD-10-CM

## 2022-08-08 DIAGNOSIS — E669 Obesity, unspecified: Secondary | ICD-10-CM

## 2022-08-08 DIAGNOSIS — Z23 Encounter for immunization: Secondary | ICD-10-CM

## 2022-08-08 DIAGNOSIS — I10 Essential (primary) hypertension: Secondary | ICD-10-CM | POA: Diagnosis not present

## 2022-08-08 DIAGNOSIS — R361 Hematospermia: Secondary | ICD-10-CM

## 2022-08-08 DIAGNOSIS — E1169 Type 2 diabetes mellitus with other specified complication: Secondary | ICD-10-CM | POA: Diagnosis not present

## 2022-08-08 DIAGNOSIS — R399 Unspecified symptoms and signs involving the genitourinary system: Secondary | ICD-10-CM | POA: Diagnosis not present

## 2022-08-08 DIAGNOSIS — Z6831 Body mass index (BMI) 31.0-31.9, adult: Secondary | ICD-10-CM

## 2022-08-08 LAB — POCT GLYCOSYLATED HEMOGLOBIN (HGB A1C): HbA1c, POC (controlled diabetic range): 6.3 % (ref 0.0–7.0)

## 2022-08-08 LAB — GLUCOSE, POCT (MANUAL RESULT ENTRY): POC Glucose: 77 mg/dl (ref 70–99)

## 2022-08-08 MED ORDER — METFORMIN HCL ER 500 MG PO TB24
1000.0000 mg | ORAL_TABLET | Freq: Every day | ORAL | 6 refills | Status: DC
  Filled 2022-08-08: qty 60, 30d supply, fill #0
  Filled 2022-09-30: qty 60, 30d supply, fill #1
  Filled 2022-11-01: qty 60, 30d supply, fill #2
  Filled 2022-12-05: qty 60, 30d supply, fill #3
  Filled 2023-01-09: qty 60, 30d supply, fill #4

## 2022-08-08 MED ORDER — TAMSULOSIN HCL 0.4 MG PO CAPS
0.8000 mg | ORAL_CAPSULE | Freq: Every day | ORAL | 6 refills | Status: DC
  Filled 2022-08-08: qty 60, 30d supply, fill #0
  Filled 2022-09-30: qty 60, 30d supply, fill #1
  Filled 2022-11-01: qty 60, 30d supply, fill #2
  Filled 2022-12-05: qty 60, 30d supply, fill #3
  Filled 2023-01-09: qty 60, 30d supply, fill #4
  Filled 2023-02-22: qty 60, 30d supply, fill #5
  Filled 2023-04-11: qty 60, 30d supply, fill #6

## 2022-08-08 MED ORDER — ATORVASTATIN CALCIUM 40 MG PO TABS
40.0000 mg | ORAL_TABLET | Freq: Every day | ORAL | 6 refills | Status: DC
  Filled 2022-08-08: qty 30, 30d supply, fill #0
  Filled 2022-09-30: qty 30, 30d supply, fill #1
  Filled 2022-11-01: qty 30, 30d supply, fill #2
  Filled 2022-12-05: qty 30, 30d supply, fill #3
  Filled 2023-01-09: qty 30, 30d supply, fill #4
  Filled 2023-04-11: qty 30, 30d supply, fill #5

## 2022-08-08 MED ORDER — LISINOPRIL 20 MG PO TABS
20.0000 mg | ORAL_TABLET | Freq: Every day | ORAL | 6 refills | Status: DC
  Filled 2022-08-08: qty 30, 30d supply, fill #0
  Filled 2022-09-30: qty 30, 30d supply, fill #1
  Filled 2022-11-01: qty 30, 30d supply, fill #2
  Filled 2022-12-05: qty 30, 30d supply, fill #3
  Filled 2023-01-09: qty 30, 30d supply, fill #4

## 2022-08-08 NOTE — Patient Instructions (Signed)
Exercising to Stay Healthy To become healthy and stay healthy, it is recommended that you do moderate-intensity and vigorous-intensity exercise. You can tell that you are exercising at a moderate intensity if your heart starts beating faster and you start breathing faster but can still hold a conversation. You can tell that you are exercising at a vigorous intensity if you are breathing much harder and faster and cannot hold a conversation while exercising. How can exercise benefit me? Exercising regularly is important. It has many health benefits, such as: Improving overall fitness, flexibility, and endurance. Increasing bone density. Helping with weight control. Decreasing body fat. Increasing muscle strength and endurance. Reducing stress and tension, anxiety, depression, or anger. Improving overall health. What guidelines should I follow while exercising? Before you start a new exercise program, talk with your health care provider. Do not exercise so much that you hurt yourself, feel dizzy, or get very short of breath. Wear comfortable clothes and wear shoes with good support. Drink plenty of water while you exercise to prevent dehydration or heat stroke. Work out until your breathing and your heartbeat get faster (moderate intensity). How often should I exercise? Choose an activity that you enjoy, and set realistic goals. Your health care provider can help you make an activity plan that is individually designed and works best for you. Exercise regularly as told by your health care provider. This may include: Doing strength training two times a week, such as: Lifting weights. Using resistance bands. Push-ups. Sit-ups. Yoga. Doing a certain intensity of exercise for a given amount of time. Choose from these options: A total of 150 minutes of moderate-intensity exercise every week. A total of 75 minutes of vigorous-intensity exercise every week. A mix of moderate-intensity and  vigorous-intensity exercise every week. Children, pregnant women, people who have not exercised regularly, people who are overweight, and older adults may need to talk with a health care provider about what activities are safe to perform. If you have a medical condition, be sure to talk with your health care provider before you start a new exercise program. What are some exercise ideas? Moderate-intensity exercise ideas include: Walking 1 mile (1.6 km) in about 15 minutes. Biking. Hiking. Golfing. Dancing. Water aerobics. Vigorous-intensity exercise ideas include: Walking 4.5 miles (7.2 km) or more in about 1 hour. Jogging or running 5 miles (8 km) in about 1 hour. Biking 10 miles (16.1 km) or more in about 1 hour. Lap swimming. Roller-skating or in-line skating. Cross-country skiing. Vigorous competitive sports, such as football, basketball, and soccer. Jumping rope. Aerobic dancing. What are some everyday activities that can help me get exercise? Yard work, such as: Pushing a lawn mower. Raking and bagging leaves. Washing your car. Pushing a stroller. Shoveling snow. Gardening. Washing windows or floors. How can I be more active in my day-to-day activities? Use stairs instead of an elevator. Take a walk during your lunch break. If you drive, park your car farther away from your work or school. If you take public transportation, get off one stop early and walk the rest of the way. Stand up or walk around during all of your indoor phone calls. Get up, stretch, and walk around every 30 minutes throughout the day. Enjoy exercise with a friend. Support to continue exercising will help you keep a regular routine of activity. Where to find more information You can find more information about exercising to stay healthy from: U.S. Department of Health and Human Services: www.hhs.gov Centers for Disease Control and Prevention (  CDC): www.cdc.gov Summary Exercising regularly is  important. It will improve your overall fitness, flexibility, and endurance. Regular exercise will also improve your overall health. It can help you control your weight, reduce stress, and improve your bone density. Do not exercise so much that you hurt yourself, feel dizzy, or get very short of breath. Before you start a new exercise program, talk with your health care provider. This information is not intended to replace advice given to you by your health care provider. Make sure you discuss any questions you have with your health care provider. Document Revised: 01/15/2021 Document Reviewed: 01/15/2021 Elsevier Patient Education  2023 Elsevier Inc.  
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
## 2022-09-30 ENCOUNTER — Other Ambulatory Visit (HOSPITAL_COMMUNITY): Payer: Self-pay

## 2022-11-01 ENCOUNTER — Other Ambulatory Visit: Payer: Self-pay | Admitting: Family Medicine

## 2022-11-01 ENCOUNTER — Other Ambulatory Visit (HOSPITAL_COMMUNITY): Payer: Self-pay

## 2022-11-01 DIAGNOSIS — F17200 Nicotine dependence, unspecified, uncomplicated: Secondary | ICD-10-CM

## 2022-11-08 LAB — HM DIABETES EYE EXAM

## 2022-12-05 ENCOUNTER — Other Ambulatory Visit (HOSPITAL_COMMUNITY): Payer: Self-pay

## 2023-01-09 ENCOUNTER — Other Ambulatory Visit (HOSPITAL_COMMUNITY): Payer: Self-pay

## 2023-02-06 ENCOUNTER — Ambulatory Visit: Payer: Commercial Managed Care - HMO | Attending: Family Medicine | Admitting: Family Medicine

## 2023-02-06 ENCOUNTER — Encounter: Payer: Self-pay | Admitting: Family Medicine

## 2023-02-06 VITALS — BP 135/82 | HR 74 | Ht 67.0 in | Wt 201.8 lb

## 2023-02-06 DIAGNOSIS — E785 Hyperlipidemia, unspecified: Secondary | ICD-10-CM

## 2023-02-06 DIAGNOSIS — E1169 Type 2 diabetes mellitus with other specified complication: Secondary | ICD-10-CM | POA: Diagnosis not present

## 2023-02-06 DIAGNOSIS — Z7984 Long term (current) use of oral hypoglycemic drugs: Secondary | ICD-10-CM | POA: Diagnosis not present

## 2023-02-06 DIAGNOSIS — E669 Obesity, unspecified: Secondary | ICD-10-CM | POA: Diagnosis not present

## 2023-02-06 DIAGNOSIS — Z125 Encounter for screening for malignant neoplasm of prostate: Secondary | ICD-10-CM

## 2023-02-06 DIAGNOSIS — Z6831 Body mass index (BMI) 31.0-31.9, adult: Secondary | ICD-10-CM

## 2023-02-06 DIAGNOSIS — Z23 Encounter for immunization: Secondary | ICD-10-CM | POA: Diagnosis not present

## 2023-02-06 DIAGNOSIS — F17200 Nicotine dependence, unspecified, uncomplicated: Secondary | ICD-10-CM

## 2023-02-06 DIAGNOSIS — F17210 Nicotine dependence, cigarettes, uncomplicated: Secondary | ICD-10-CM | POA: Diagnosis not present

## 2023-02-06 LAB — POCT GLYCOSYLATED HEMOGLOBIN (HGB A1C): HbA1c, POC (controlled diabetic range): 6.6 % (ref 0.0–7.0)

## 2023-02-06 NOTE — Addendum Note (Signed)
Addended by: Ronette Deter on: 02/06/2023 11:04 AM   Modules accepted: Orders

## 2023-02-06 NOTE — Patient Instructions (Signed)

## 2023-02-06 NOTE — Progress Notes (Signed)
Subjective:  Patient ID: Dalton Arnold, male    DOB: 06/18/1966  Age: 57 y.o. MRN: [id number]  CC: Diabetes   HPI Dalton Arnold is a 57 y.o. year old male with a history of type 2 diabetes mellitus (A1c 6.6), hypertension, thyroid adenocarcinoma diagnosed in 08/2014 (status post left lobectomy, last seen by endocrine in 04/2016 and declined complete lobectomy and radioactive iodine therapy) who presents today for a follow-up visit.   Interval History: He is doing well on metformin and has had no hypoglycemia, no neuropathy, no visual symptoms. Up to date on eye exams. Exercises regularly by walking and going for a run. Doing well on his antihypertensive and his statin.  He also remains on tamsulosin for BPH symptoms which are controlled on his medication. He is not ready to quit smoking - smokes 1ppd of Cigarettes. Denies presence of additional concerns today.  Past Medical History:  Diagnosis Date   Diabetes mellitus without complication (HCC)    on meds   Diarrhea    Hyperlipidemia    on meds   Hypertension    on meds   Thyroid mass     Past Surgical History:  Procedure Laterality Date   COLONOSCOPY  2019   HD-MAC-suprep(good)-TA   ESOPHAGOGASTRODUODENOSCOPY N/A 06/27/2014   Procedure: ESOPHAGOGASTRODUODENOSCOPY (EGD);  Surgeon: Theda Belfast, MD;  Location: [facility];  Service: Endoscopy;  Laterality: N/A;   INGUINAL HERNIA REPAIR Left    INGUINAL HERNIA REPAIR Right    NASAL FRACTURE SURGERY  1999   not broken- had extra bones/tissue to be removed   THYROIDECTOMY Left 08/19/2014   Procedure: LEFT THYROID LOBECTOMY;  Surgeon: Drema Halon, MD;  Location: [facility];  Service: ENT;  Laterality: Left;   UMBILICAL HERNIA REPAIR      Family History  Problem Relation Age of Onset   Diabetes Mother    Healthy Mother    Healthy Father    Thyroid disease Neg Hx    Colon cancer Neg Hx    Colon polyps Neg Hx    Esophageal cancer Neg Hx    Rectal cancer Neg  Hx    Stomach cancer Neg Hx     Social History   Socioeconomic History   Marital status: Divorced    Spouse name: Not on file   Number of children: Not on file   Years of education: Not on file   Highest education level: Not on file  Occupational History   Not on file  Tobacco Use   Smoking status: Every Day    Packs/day: .5    Types: Cigarettes   Smokeless tobacco: Never  Vaping Use   Vaping Use: Never used  Substance and Sexual Activity   Alcohol use: Yes    Alcohol/week: 1.0 - 3.0 standard drink of alcohol    Types: 1 - 3 Standard drinks or equivalent per week    Comment: occ.    Drug use: No   Sexual activity: Yes  Other Topics Concern   Not on file  Social History Narrative   Not on file   Social Determinants of Health   Financial Resource Strain: Not on file  Food Insecurity: Not on file  Transportation Needs: Not on file  Physical Activity: Not on file  Stress: Not on file  Social Connections: Not on file    No Known Allergies  Outpatient Medications Prior to Visit  Medication Sig Dispense Refill   atorvastatin (LIPITOR) 40 MG tablet Take 1 tablet (  40 mg total) by mouth daily. 30 tablet 6   Blood Glucose Monitoring Suppl (CONTOUR NEXT MONITOR) w/Device KIT 1 each by Does not apply route daily. 1 kit 0   glucose blood (CONTOUR NEXT TEST) test strip Use as instructed daily 30 each 12   Lancets Thin MISC 1 each by Does not apply route daily. 30 each 12   lisinopril (ZESTRIL) 20 MG tablet Take 1 tablet (20 mg total) by mouth daily. 30 tablet 6   metFORMIN (GLUCOPHAGE-XR) 500 MG 24 hr tablet Take 2 tablets (1,000 mg total) by mouth daily with breakfast. 60 tablet 6   tamsulosin (FLOMAX) 0.4 MG CAPS capsule Take 2 capsules (0.8 mg total) by mouth daily. 60 capsule 6   buPROPion (WELLBUTRIN XL) 150 MG 24 hr tablet Take 1 tablet (150 mg total) by mouth daily For smoking cessation (Patient not taking: Reported on 02/06/2023) 30 tablet 2   Facility-Administered  Medications Prior to Visit  Medication Dose Route Frequency Provider Last Rate Last Admin   0.9 %  sodium chloride infusion  500 mL Intravenous Once Charlie Pitter III, MD         ROS Review of Systems  Constitutional:  Negative for activity change and appetite change.  HENT:  Negative for sinus pressure and sore throat.   Respiratory:  Negative for chest tightness, shortness of breath and wheezing.   Cardiovascular:  Negative for chest pain and palpitations.  Gastrointestinal:  Negative for abdominal distention, abdominal pain and constipation.  Genitourinary: Negative.   Musculoskeletal: Negative.   Psychiatric/Behavioral:  Negative for behavioral problems and dysphoric mood.     Objective:  BP 135/82   Pulse 74   Ht 5\' 7"  (1.702 m)   Wt 201 lb 12.8 oz (91.5 kg)   SpO2 96%   BMI 31.61 kg/m      02/06/2023   10:13 AM 08/08/2022    4:26 PM 11/03/2021   10:30 AM  BP/Weight  Systolic BP 135 117 94  Diastolic BP 82 74 67  Wt. (Lbs) 201.8 211.2   BMI 31.61 kg/m2 31.65 kg/m2       Physical Exam Constitutional:      Appearance: He is well-developed.  Cardiovascular:     Rate and Rhythm: Normal rate.     Heart sounds: Normal heart sounds. No murmur heard. Pulmonary:     Effort: Pulmonary effort is normal.     Breath sounds: Normal breath sounds. No wheezing or rales.  Chest:     Chest wall: No tenderness.  Abdominal:     General: Bowel sounds are normal. There is no distension.     Palpations: Abdomen is soft. There is no mass.     Tenderness: There is no abdominal tenderness.  Musculoskeletal:        General: Normal range of motion.     Right lower leg: No edema.     Left lower leg: No edema.  Neurological:     Mental Status: He is alert and oriented to person, place, and time.  Psychiatric:        Mood and Affect: Mood normal.        Latest Ref Rng & Units 10/29/2021    8:45 AM 03/29/2021   10:40 AM 11/26/2020   11:35 AM  CMP  Glucose 70 - 99 mg/dL 161  93   096   BUN 6 - 24 mg/dL 22  13  15    Creatinine 0.76 - 1.27 mg/dL 0.45  4.09  0.73   Sodium 134 - 144 mmol/L 140  141  137   Potassium 3.5 - 5.2 mmol/L 4.9  4.8  4.4   Chloride 96 - 106 mmol/L 103  105  101   CO2 20 - 29 mmol/L 26  22  20    Calcium 8.7 - 10.2 mg/dL 9.1  9.1  9.6   Total Protein 6.0 - 8.5 g/dL 7.3   6.8   Total Bilirubin 0.0 - 1.2 mg/dL 0.3   0.3   Alkaline Phos 44 - 121 IU/L 91   91   AST 0 - 40 IU/L 18   22   ALT 0 - 44 IU/L 22   33     Lipid Panel     Component Value Date/Time   CHOL 161 10/29/2021 0845   TRIG 146 10/29/2021 0845   HDL 34 (L) 10/29/2021 0845   CHOLHDL 5.4 (H) 11/26/2020 1135   CHOLHDL 6.2 (H) 04/11/2016 1009   VLDL 30 04/11/2016 1009   LDLCALC 101 (H) 10/29/2021 0845    CBC    Component Value Date/Time   WBC 11.7 (H) 11/26/2020 1135   WBC 11.8 (H) 07/19/2019 1705   RBC 5.80 11/26/2020 1135   RBC 5.87 (H) 07/19/2019 1705   HGB 15.2 11/26/2020 1135   HCT 46.5 11/26/2020 1135   PLT 376 11/26/2020 1135   MCV 80 11/26/2020 1135   MCH 26.2 (L) 11/26/2020 1135   MCH 25.2 (L) 07/19/2019 1705   MCHC 32.7 11/26/2020 1135   MCHC 33.3 07/19/2019 1705   RDW 14.9 11/26/2020 1135   LYMPHSABS 2.8 11/26/2020 1135   MONOABS 0.8 07/19/2019 1705   EOSABS 0.5 (H) 11/26/2020 1135   BASOSABS 0.1 11/26/2020 1135    Lab Results  Component Value Date   HGBA1C 6.6 02/06/2023    Assessment & Plan:  1. Hyperlipidemia associated with type 2 diabetes mellitus (HCC) LDL of 101 slightly above goal of less than 100 Will check lipid panel today Low-cholesterol diet - Microalbumin / creatinine urine ratio - LP+Non-HDL Cholesterol - CMP14+EGFR  2. Type 2 diabetes mellitus with obesity (HCC) Controlled with A1c of 6.6 Continue with metformin Counseled on Diabetic diet, my plate method, 811 minutes of moderate intensity exercise/week Blood sugar logs with fasting goals of 80-120 mg/dl, random of less than 914 and in the event of sugars less than 60 mg/dl  or greater than 782 mg/dl encouraged to notify the clinic. Advised on the need for annual eye exams, annual foot exams, Pneumonia vaccine.  - POCT glycosylated hemoglobin (Hb A1C)  3. Screening for prostate cancer - PSA, total and free  4.  Smoker Spent 3 minutes counseling on smoking cessation but he is not ready to quit at this time.   No orders of the defined types were placed in this encounter.   Follow-up: Return in about 6 months (around 08/09/2023) for Chronic medical conditions.       Hoy Register, MD, FAAFP. [facility] [place], [place] [phone]   02/06/2023, 10:38 AM

## 2023-02-07 LAB — CMP14+EGFR
ALT: 23 IU/L (ref 0–44)
AST: 20 IU/L (ref 0–40)
Albumin/Globulin Ratio: 1.7 (ref 1.2–2.2)
Albumin: 4.5 g/dL (ref 3.8–4.9)
Alkaline Phosphatase: 89 IU/L (ref 44–121)
BUN/Creatinine Ratio: 20 (ref 9–20)
BUN: 19 mg/dL (ref 6–24)
Bilirubin Total: <0.2 mg/dL (ref 0.0–1.2)
CO2: 18 mmol/L — ABNORMAL LOW (ref 20–29)
Calcium: 9.5 mg/dL (ref 8.7–10.2)
Chloride: 104 mmol/L (ref 96–106)
Creatinine, Ser: 0.95 mg/dL (ref 0.76–1.27)
Globulin, Total: 2.6 g/dL (ref 1.5–4.5)
Glucose: 119 mg/dL — ABNORMAL HIGH (ref 70–99)
Potassium: 4.9 mmol/L (ref 3.5–5.2)
Sodium: 139 mmol/L (ref 134–144)
Total Protein: 7.1 g/dL (ref 6.0–8.5)
eGFR: 93 mL/min/{1.73_m2} (ref >59)

## 2023-02-07 LAB — LP+NON-HDL CHOLESTEROL
Cholesterol, Total: 170 mg/dL (ref 100–199)
HDL: 41 mg/dL (ref >39)
LDL Chol Calc (NIH): 105 mg/dL — ABNORMAL HIGH (ref 0–99)
Total Non-HDL-Chol (LDL+VLDL): 129 mg/dL (ref 0–129)
Triglycerides: 133 mg/dL (ref 0–149)
VLDL Cholesterol Cal: 24 mg/dL (ref 5–40)

## 2023-02-07 LAB — MICROALBUMIN / CREATININE URINE RATIO
Creatinine, Urine: 41.8 mg/dL
Microalb/Creat Ratio: 71 mg/g creat — ABNORMAL HIGH (ref 0–29)
Microalbumin, Urine: 29.7 ug/mL

## 2023-02-07 LAB — PSA, TOTAL AND FREE
PSA, Free Pct: 37.9 %
PSA, Free: 1.06 ng/mL
Prostate Specific Ag, Serum: 2.8 ng/mL (ref 0.0–4.0)

## 2023-02-08 ENCOUNTER — Other Ambulatory Visit (HOSPITAL_COMMUNITY): Payer: Self-pay

## 2023-02-08 ENCOUNTER — Other Ambulatory Visit: Payer: Self-pay | Admitting: Family Medicine

## 2023-02-08 DIAGNOSIS — E1169 Type 2 diabetes mellitus with other specified complication: Secondary | ICD-10-CM

## 2023-02-08 MED ORDER — DAPAGLIFLOZIN PROPANEDIOL 10 MG PO TABS
10.0000 mg | ORAL_TABLET | Freq: Every day | ORAL | 1 refills | Status: DC
  Filled 2023-02-08 (×2): qty 90, 90d supply, fill #0

## 2023-02-10 ENCOUNTER — Other Ambulatory Visit (HOSPITAL_COMMUNITY): Payer: Self-pay

## 2023-02-22 ENCOUNTER — Other Ambulatory Visit (HOSPITAL_COMMUNITY): Payer: Self-pay

## 2023-02-28 ENCOUNTER — Telehealth: Payer: Self-pay | Admitting: Family Medicine

## 2023-02-28 ENCOUNTER — Other Ambulatory Visit (HOSPITAL_COMMUNITY): Payer: Self-pay

## 2023-02-28 ENCOUNTER — Ambulatory Visit: Payer: Commercial Managed Care - HMO | Attending: Family Medicine

## 2023-02-28 DIAGNOSIS — I10 Essential (primary) hypertension: Secondary | ICD-10-CM

## 2023-02-28 DIAGNOSIS — E1169 Type 2 diabetes mellitus with other specified complication: Secondary | ICD-10-CM

## 2023-02-28 MED ORDER — LISINOPRIL 10 MG PO TABS
10.0000 mg | ORAL_TABLET | Freq: Every day | ORAL | 1 refills | Status: DC
Start: 2023-02-28 — End: 2023-08-29
  Filled 2023-02-28 – 2023-04-11 (×3): qty 90, 90d supply, fill #0

## 2023-02-28 MED ORDER — METFORMIN HCL 500 MG PO TABS
1000.0000 mg | ORAL_TABLET | Freq: Two times a day (BID) | ORAL | 1 refills | Status: DC
  Filled 2023-02-28 – 2023-04-11 (×2): qty 180, 45d supply, fill #0
  Filled 2023-08-29: qty 180, 45d supply, fill #1

## 2023-02-28 NOTE — Addendum Note (Signed)
Addended by: Hoy Register on: 02/28/2023 05:50 PM   Modules accepted: Orders

## 2023-02-28 NOTE — Telephone Encounter (Signed)
Please inform him to discontinue [place] and I have sent in a prescription for metformin to the pharmacy.  I also decreased lisinopril from 20 mg to 10 mg.Thanks

## 2023-02-28 NOTE — Telephone Encounter (Signed)
Patient came in this morning having concerns about his meds he taking Faxiga side effect... rash! Patient also states that Lisinopril making him dizzy so he said he started taking half of the pill. Everyday.

## 2023-02-28 NOTE — Telephone Encounter (Signed)
Routing to PCP for review.

## 2023-03-01 ENCOUNTER — Other Ambulatory Visit (HOSPITAL_COMMUNITY): Payer: Self-pay

## 2023-03-01 LAB — BASIC METABOLIC PANEL
BUN/Creatinine Ratio: 21 — ABNORMAL HIGH (ref 9–20)
BUN: 19 mg/dL (ref 6–24)
CO2: 18 mmol/L — ABNORMAL LOW (ref 20–29)
Calcium: 9.3 mg/dL (ref 8.7–10.2)
Chloride: 103 mmol/L (ref 96–106)
Creatinine, Ser: 0.91 mg/dL (ref 0.76–1.27)
Glucose: 122 mg/dL — ABNORMAL HIGH (ref 70–99)
Potassium: 4.8 mmol/L (ref 3.5–5.2)
Sodium: 137 mmol/L (ref 134–144)
eGFR: 97 mL/min/{1.73_m2} (ref >59)

## 2023-03-01 NOTE — Telephone Encounter (Signed)
Pt called and informed of medication change. 

## 2023-03-13 ENCOUNTER — Other Ambulatory Visit (HOSPITAL_COMMUNITY): Payer: Self-pay

## 2023-04-10 ENCOUNTER — Ambulatory Visit: Payer: Commercial Managed Care - HMO

## 2023-04-11 ENCOUNTER — Other Ambulatory Visit (HOSPITAL_COMMUNITY): Payer: Self-pay

## 2023-05-26 ENCOUNTER — Other Ambulatory Visit (HOSPITAL_COMMUNITY): Payer: Self-pay

## 2023-05-26 ENCOUNTER — Other Ambulatory Visit: Payer: Self-pay | Admitting: Family Medicine

## 2023-05-26 DIAGNOSIS — R399 Unspecified symptoms and signs involving the genitourinary system: Secondary | ICD-10-CM

## 2023-05-26 MED ORDER — TAMSULOSIN HCL 0.4 MG PO CAPS
0.8000 mg | ORAL_CAPSULE | Freq: Every day | ORAL | 6 refills | Status: DC
Start: 2023-05-26 — End: 2023-08-29
  Filled 2023-05-26: qty 60, 30d supply, fill #0
  Filled 2023-07-18: qty 60, 30d supply, fill #1

## 2023-05-30 ENCOUNTER — Other Ambulatory Visit (HOSPITAL_COMMUNITY): Payer: Self-pay

## 2023-07-18 ENCOUNTER — Other Ambulatory Visit (HOSPITAL_COMMUNITY): Payer: Self-pay

## 2023-08-09 ENCOUNTER — Ambulatory Visit: Payer: Commercial Managed Care - HMO | Admitting: Family Medicine

## 2023-08-29 ENCOUNTER — Other Ambulatory Visit (HOSPITAL_COMMUNITY): Payer: Self-pay

## 2023-08-29 ENCOUNTER — Encounter: Payer: Self-pay | Admitting: Family Medicine

## 2023-08-29 ENCOUNTER — Ambulatory Visit: Payer: Managed Care, Other (non HMO) | Attending: Family Medicine | Admitting: Family Medicine

## 2023-08-29 VITALS — BP 128/84 | HR 69 | Ht 67.0 in | Wt 200.2 lb

## 2023-08-29 DIAGNOSIS — F17200 Nicotine dependence, unspecified, uncomplicated: Secondary | ICD-10-CM

## 2023-08-29 DIAGNOSIS — Z23 Encounter for immunization: Secondary | ICD-10-CM | POA: Diagnosis not present

## 2023-08-29 DIAGNOSIS — E669 Obesity, unspecified: Secondary | ICD-10-CM

## 2023-08-29 DIAGNOSIS — F17210 Nicotine dependence, cigarettes, uncomplicated: Secondary | ICD-10-CM

## 2023-08-29 DIAGNOSIS — R399 Unspecified symptoms and signs involving the genitourinary system: Secondary | ICD-10-CM

## 2023-08-29 DIAGNOSIS — N529 Male erectile dysfunction, unspecified: Secondary | ICD-10-CM

## 2023-08-29 DIAGNOSIS — E785 Hyperlipidemia, unspecified: Secondary | ICD-10-CM | POA: Diagnosis not present

## 2023-08-29 DIAGNOSIS — Z7984 Long term (current) use of oral hypoglycemic drugs: Secondary | ICD-10-CM

## 2023-08-29 DIAGNOSIS — Z6831 Body mass index (BMI) 31.0-31.9, adult: Secondary | ICD-10-CM

## 2023-08-29 DIAGNOSIS — E1169 Type 2 diabetes mellitus with other specified complication: Secondary | ICD-10-CM | POA: Diagnosis not present

## 2023-08-29 DIAGNOSIS — E66811 Obesity, class 1: Secondary | ICD-10-CM

## 2023-08-29 DIAGNOSIS — I10 Essential (primary) hypertension: Secondary | ICD-10-CM

## 2023-08-29 LAB — POCT GLYCOSYLATED HEMOGLOBIN (HGB A1C): HbA1c, POC (controlled diabetic range): 6.6 % (ref 0.0–7.0)

## 2023-08-29 MED ORDER — TAMSULOSIN HCL 0.4 MG PO CAPS
0.8000 mg | ORAL_CAPSULE | Freq: Every day | ORAL | 6 refills | Status: DC
  Filled 2023-08-29: qty 60, 30d supply, fill #0
  Filled 2023-11-21: qty 60, 30d supply, fill #1

## 2023-08-29 MED ORDER — ATORVASTATIN CALCIUM 40 MG PO TABS
40.0000 mg | ORAL_TABLET | Freq: Every day | ORAL | 6 refills | Status: DC
Start: 2023-08-29 — End: 2024-08-15
  Filled 2023-08-29: qty 30, 30d supply, fill #0
  Filled 2024-06-13: qty 30, 30d supply, fill #1

## 2023-08-29 MED ORDER — BUPROPION HCL ER (XL) 150 MG PO TB24
150.0000 mg | ORAL_TABLET | Freq: Every day | ORAL | 1 refills | Status: AC
  Filled 2023-08-29: qty 90, 90d supply, fill #0

## 2023-08-29 MED ORDER — LISINOPRIL 10 MG PO TABS
10.0000 mg | ORAL_TABLET | Freq: Every day | ORAL | 1 refills | Status: DC
Start: 2023-08-29 — End: 2024-08-13
  Filled 2023-08-29: qty 90, 90d supply, fill #0
  Filled 2024-06-13: qty 30, 30d supply, fill #1

## 2023-08-29 NOTE — Patient Instructions (Signed)

## 2023-08-29 NOTE — Progress Notes (Signed)
Subjective:  Patient ID: Dalton Arnold, male    DOB: 1966/06/19  Age: 57 y.o. MRN: [id number]  CC: Medical Management of Chronic Issues (Discuss Lipitor medication)   HPI Dalton Arnold is a 57 y.o. year old male with a history of  type 2 diabetes mellitus (A1c 6.6), hypertension, thyroid adenocarcinoma diagnosed in 08/2014 (status post left lobectomy, last seen by endocrine in 04/2016 and declined complete lobectomy and radioactive iodine therapy), Nicotine dependence (1 ppd since the age of 58)  who presents today for a follow-up visit.   Interval History: Discussed the use of AI scribe software for clinical note transcription with the patient, who gave verbal consent to proceed.  He presents with frequent urination and erectile dysfunction. He reports that his diabetes is well controlled with metformin, taken 2 tabs daily in the morning. He also takes lisinopril for hypertension, but reports frequent urination, which he attributes to this medication. He has been prescribed tamsulosin for urinary symptoms, which he believes helps, but he still experiences frequent urination.  Of note he drinks an excessive amount of coffee and beer.   He also reports that he has stopped taking his cholesterol medication, atorvastatin, due to dizziness, and now only takes it once or twice a week.  The patient also reports difficulty with erections, which he attributes to smoking and possibly not being attracted to his current sexual partner. He initially expresses interest in trying Viagra, but decides against it during this visit. He also expresses a desire to quit smoking and has previously tried bupropion, which he found effective but stopped due to feeling mentally unwell. He requests a refill of this medication to try again.  The patient also mentions that he runs two miles every morning and works in a physically demanding job.       Past Medical History:  Diagnosis Date   Diabetes mellitus without  complication (HCC)    on meds   Diarrhea    Hyperlipidemia    on meds   Hypertension    on meds   Thyroid mass     Past Surgical History:  Procedure Laterality Date   COLONOSCOPY  2019   HD-MAC-suprep(good)-TA   ESOPHAGOGASTRODUODENOSCOPY N/A 06/27/2014   Procedure: ESOPHAGOGASTRODUODENOSCOPY (EGD);  Surgeon: Theda Belfast, MD;  Location: [facility];  Service: Endoscopy;  Laterality: N/A;   INGUINAL HERNIA REPAIR Left    INGUINAL HERNIA REPAIR Right    NASAL FRACTURE SURGERY  1999   not broken- had extra bones/tissue to be removed   THYROIDECTOMY Left 08/19/2014   Procedure: LEFT THYROID LOBECTOMY;  Surgeon: Drema Halon, MD;  Location: [facility];  Service: ENT;  Laterality: Left;   UMBILICAL HERNIA REPAIR      Family History  Problem Relation Age of Onset   Diabetes Mother    Healthy Mother    Healthy Father    Thyroid disease Neg Hx    Colon cancer Neg Hx    Colon polyps Neg Hx    Esophageal cancer Neg Hx    Rectal cancer Neg Hx    Stomach cancer Neg Hx     Social History   Socioeconomic History   Marital status: Divorced    Spouse name: Not on file   Number of children: Not on file   Years of education: Not on file   Highest education level: Not on file  Occupational History   Not on file  Tobacco Use   Smoking status: Every Day  Current packs/day: 0.50    Types: Cigarettes   Smokeless tobacco: Never  Vaping Use   Vaping status: Never Used  Substance and Sexual Activity   Alcohol use: Yes    Alcohol/week: 1.0 - 3.0 standard drink of alcohol    Types: 1 - 3 Standard drinks or equivalent per week    Comment: occ.    Drug use: No   Sexual activity: Yes  Other Topics Concern   Not on file  Social History Narrative   Not on file   Social Determinants of Health   Financial Resource Strain: Not on file  Food Insecurity: Not on file  Transportation Needs: Not on file  Physical Activity: Not on file  Stress: Not on file  Social  Connections: Not on file    Allergies  Allergen Reactions   Farxiga [Dapagliflozin] Rash    Outpatient Medications Prior to Visit  Medication Sig Dispense Refill   Blood Glucose Monitoring Suppl (CONTOUR NEXT MONITOR) w/Device KIT 1 each by Does not apply route daily. 1 kit 0   glucose blood (CONTOUR NEXT TEST) test strip Use as instructed daily 30 each 12   Lancets Thin MISC 1 each by Does not apply route daily. 30 each 12   metFORMIN (GLUCOPHAGE) 500 MG tablet Take 2 tablets (1,000 mg total) by mouth 2 (two) times daily with a meal. 180 tablet 1   atorvastatin (LIPITOR) 40 MG tablet Take 1 tablet (40 mg total) by mouth daily. 30 tablet 6   lisinopril (ZESTRIL) 10 MG tablet Take 1 tablet (10 mg total) by mouth daily. 90 tablet 1   tamsulosin (FLOMAX) 0.4 MG CAPS capsule Take 2 capsules (0.8 mg total) by mouth daily. 60 capsule 6   buPROPion (WELLBUTRIN XL) 150 MG 24 hr tablet Take 1 tablet (150 mg total) by mouth daily For smoking cessation (Patient not taking: Reported on 02/06/2023) 30 tablet 2   0.9 %  sodium chloride infusion      No facility-administered medications prior to visit.     ROS Review of Systems  Constitutional:  Negative for activity change and appetite change.  HENT:  Negative for sinus pressure and sore throat.   Respiratory:  Negative for chest tightness, shortness of breath and wheezing.   Cardiovascular:  Negative for chest pain and palpitations.  Gastrointestinal:  Negative for abdominal distention, abdominal pain and constipation.  Genitourinary: Negative.   Musculoskeletal: Negative.   Psychiatric/Behavioral:  Negative for behavioral problems and dysphoric mood.     Objective:  BP 128/84   Pulse 69   Ht 5\' 7"  (1.702 m)   Wt 200 lb 3.2 oz (90.8 kg)   SpO2 96%   BMI 31.36 kg/m      08/29/2023   10:59 AM 02/06/2023   10:13 AM 08/08/2022    4:26 PM  BP/Weight  Systolic BP 128 135 117  Diastolic BP 84 82 74  Wt. (Lbs) 200.2 201.8 211.2  BMI 31.36  kg/m2 31.61 kg/m2 31.65 kg/m2      Physical Exam Constitutional:      Appearance: He is well-developed.  Cardiovascular:     Rate and Rhythm: Normal rate.     Heart sounds: Normal heart sounds. No murmur heard. Pulmonary:     Effort: Pulmonary effort is normal.     Breath sounds: Normal breath sounds. No wheezing or rales.  Chest:     Chest wall: No tenderness.  Abdominal:     General: Bowel sounds are normal. There is no  distension.     Palpations: Abdomen is soft. There is no mass.     Tenderness: There is no abdominal tenderness.  Musculoskeletal:        General: Normal range of motion.     Right lower leg: No edema.     Left lower leg: No edema.  Neurological:     Mental Status: He is alert and oriented to person, place, and time.  Psychiatric:        Mood and Affect: Mood normal.        Latest Ref Rng & Units 02/28/2023   10:31 AM 02/06/2023   10:42 AM 10/29/2021    8:45 AM  CMP  Glucose 70 - 99 mg/dL 161  096  045   BUN 6 - 24 mg/dL 19  19  22    Creatinine 0.76 - 1.27 mg/dL 4.09  8.11  9.14   Sodium 134 - 144 mmol/L 137  139  140   Potassium 3.5 - 5.2 mmol/L 4.8  4.9  4.9   Chloride 96 - 106 mmol/L 103  104  103   CO2 20 - 29 mmol/L 18  18  26    Calcium 8.7 - 10.2 mg/dL 9.3  9.5  9.1   Total Protein 6.0 - 8.5 g/dL  7.1  7.3   Total Bilirubin 0.0 - 1.2 mg/dL  <7.8  0.3   Alkaline Phos 44 - 121 IU/L  89  91   AST 0 - 40 IU/L  20  18   ALT 0 - 44 IU/L  23  22     Lipid Panel     Component Value Date/Time   CHOL 170 02/06/2023 1042   TRIG 133 02/06/2023 1042   HDL 41 02/06/2023 1042   CHOLHDL 5.4 (H) 11/26/2020 1135   CHOLHDL 6.2 (H) 04/11/2016 1009   VLDL 30 04/11/2016 1009   LDLCALC 105 (H) 02/06/2023 1042    CBC    Component Value Date/Time   WBC 11.7 (H) 11/26/2020 1135   WBC 11.8 (H) 07/19/2019 1705   RBC 5.80 11/26/2020 1135   RBC 5.87 (H) 07/19/2019 1705   HGB 15.2 11/26/2020 1135   HCT 46.5 11/26/2020 1135   PLT 376 11/26/2020 1135   MCV  80 11/26/2020 1135   MCH 26.2 (L) 11/26/2020 1135   MCH 25.2 (L) 07/19/2019 1705   MCHC 32.7 11/26/2020 1135   MCHC 33.3 07/19/2019 1705   RDW 14.9 11/26/2020 1135   LYMPHSABS 2.8 11/26/2020 1135   MONOABS 0.8 07/19/2019 1705   EOSABS 0.5 (H) 11/26/2020 1135   BASOSABS 0.1 11/26/2020 1135    Lab Results  Component Value Date   HGBA1C 6.6 08/29/2023    Assessment & Plan:      Type 2 Diabetes Mellitus Well controlled with HbA1c of 6.6. Patient reports taking Metformin 1000mg  in the morning only, instead of twice daily as prescribed. -Continue Metformin 1000mg  in the morning. -Counseled on Diabetic diet, my plate method, 295 minutes of moderate intensity exercise/week Blood sugar logs with fasting goals of 80-120 mg/dl, random of less than 621 and in the event of sugars less than 60 mg/dl or greater than 308 mg/dl encouraged to notify the clinic. Advised on the need for annual eye exams, annual foot exams, Pneumonia vaccine.   Hypertension Patient reports frequent urination after taking Lisinopril 10mg  daily.  Advised that urinary frequency is unlikely due to medication  -Continue Lisinopril 10mg  daily.   Hyperlipidemia Patient reports dizziness and inconsistent use of  Atorvastatin, sometimes taking half a pill once or twice a week. -Continue Atorvastatin. -Educate on the importance of consistent use and discuss potential side effects.  Benign Prostatic Hyperplasia Patient reports urinary hesitancy improved with tamsulosin -He reports urinary frequency likely due to coffee and beer consumption. -Continue Tamsulosin. -Consider referral to urologist if symptoms persist.  Tobacco Use Disorder Patient reports smoking 1 pack per day since age 89. Expressed interest in quitting and had previous success with Bupropion. -Order lung cancer screening due to long history of smoking. -Prescribe Bupropion to aid in smoking cessation.  Erectile Dysfunction Patient reports occasional  difficulty with erections. Declined treatment at this time. -Consider prescribing Sildenafil if patient expresses interest in the future.  General Health Maintenance -Administer influenza vaccine today. -Order routine blood tests.          Meds ordered this encounter  Medications   atorvastatin (LIPITOR) 40 MG tablet    Sig: Take 1 tablet (40 mg total) by mouth daily.    Dispense:  30 tablet    Refill:  6   lisinopril (ZESTRIL) 10 MG tablet    Sig: Take 1 tablet (10 mg total) by mouth daily.    Dispense:  90 tablet    Refill:  1    Dose decrease   tamsulosin (FLOMAX) 0.4 MG CAPS capsule    Sig: Take 2 capsules (0.8 mg total) by mouth daily.    Dispense:  60 capsule    Refill:  6    Dose increase   buPROPion (WELLBUTRIN XL) 150 MG 24 hr tablet    Sig: Take 1 tablet (150 mg total) by mouth daily for smoking cessation    Dispense:  90 tablet    Refill:  1    Follow-up: Return in about 6 months (around 02/26/2024) for Chronic medical conditions.       Hoy Register, MD, FAAFP. [facility] [place], [place] [phone]   08/29/2023, 12:37 PM

## 2023-08-30 LAB — BASIC METABOLIC PANEL WITH GFR
BUN/Creatinine Ratio: 18 (ref 9–20)
BUN: 16 mg/dL (ref 6–24)
CO2: 23 mmol/L (ref 20–29)
Calcium: 9.4 mg/dL (ref 8.7–10.2)
Chloride: 104 mmol/L (ref 96–106)
Creatinine, Ser: 0.89 mg/dL (ref 0.76–1.27)
Glucose: 91 mg/dL (ref 70–99)
Potassium: 5.1 mmol/L (ref 3.5–5.2)
Sodium: 138 mmol/L (ref 134–144)
eGFR: 99 mL/min/1.73 (ref >59)

## 2023-09-15 ENCOUNTER — Inpatient Hospital Stay: Admission: RE | Admit: 2023-09-15 | Payer: Managed Care, Other (non HMO) | Source: Ambulatory Visit

## 2023-11-21 ENCOUNTER — Other Ambulatory Visit (HOSPITAL_COMMUNITY): Payer: Self-pay

## 2024-01-01 ENCOUNTER — Other Ambulatory Visit (HOSPITAL_COMMUNITY): Payer: Self-pay

## 2024-01-01 ENCOUNTER — Ambulatory Visit (HOSPITAL_COMMUNITY)
Admission: EM | Admit: 2024-01-01 | Discharge: 2024-01-01 | Disposition: A | Discharge status: Home / Self Care | Attending: Family Medicine | Admitting: Family Medicine

## 2024-01-01 ENCOUNTER — Ambulatory Visit (INDEPENDENT_AMBULATORY_CARE_PROVIDER_SITE_OTHER)

## 2024-01-01 ENCOUNTER — Encounter (HOSPITAL_COMMUNITY): Payer: Self-pay

## 2024-01-01 DIAGNOSIS — M25512 Pain in left shoulder: Secondary | ICD-10-CM | POA: Diagnosis not present

## 2024-01-01 DIAGNOSIS — M5412 Radiculopathy, cervical region: Secondary | ICD-10-CM

## 2024-01-01 MED ORDER — KETOROLAC TROMETHAMINE 10 MG PO TABS
10.0000 mg | ORAL_TABLET | Freq: Four times a day (QID) | ORAL | 0 refills | Status: DC | PRN
  Filled 2024-01-01: qty 20, 5d supply, fill #0

## 2024-01-01 MED ORDER — KETOROLAC TROMETHAMINE 30 MG/ML IJ SOLN
INTRAMUSCULAR | Status: AC
  Filled 2024-01-01: qty 1

## 2024-01-01 MED ORDER — KETOROLAC TROMETHAMINE 30 MG/ML IJ SOLN
30.0000 mg | Freq: Once | INTRAMUSCULAR | Status: AC
Start: 2024-01-01 — End: 2024-01-01
  Administered 2024-01-01: 30 mg via INTRAMUSCULAR

## 2024-01-01 MED ORDER — OMEPRAZOLE 40 MG PO CPDR
40.0000 mg | DELAYED_RELEASE_CAPSULE | Freq: Every day | ORAL | 0 refills | Status: AC
  Filled 2024-01-01: qty 10, 10d supply, fill #0

## 2024-01-01 NOTE — Discharge Instructions (Addendum)
 By my review there are no acute bony abnormalities on the x-rays. The radiologist will also read your x-ray, and if their interpretation differs significantly from mine, and the management of your condition would change, we will call you.  You have been given a shot of Toradol 30 mg today.  Ketorolac 10 mg tablets--take 1 tablet every 6 hours as needed for pain.  This is the same medicine that is in the shot we just gave you  Take omeprazole 40 mg--1 capsule daily for stomach acid.  This medicine is intended to protect your stomach while you are taking the ketorolac tablets, as the ketorolac can be irritating to your stomach.  Please make a follow-up appointment with your primary care

## 2024-01-01 NOTE — ED Provider Notes (Addendum)
 [facility]    CSN: [id number] Arrival date & time: 01/01/24  1051      History   Chief Complaint Chief Complaint  Patient presents with   Arm Pain   Numbness    HPI Dalton Arnold is a 58 y.o. male.    Arm Pain  Here for pain in his left shoulder.  It starts in the posterior left shoulder near the joint and radiates a little inferiorly posterior to the axilla.  It also radiates into his left biceps.  He notes numbness in his second third and fourth digits of the left hand.  No trauma or fall.  No headache and no trouble speaking.  He does have diabetes and his sugars have been very good.  His last A1c was 6.6  Naproxen over-the-counter has not been helping.  Last dose was yesterday  Past Medical History:  Diagnosis Date   Diabetes mellitus without complication (HCC)    on meds   Diarrhea    Hyperlipidemia    on meds   Hypertension    on meds   Thyroid mass     Patient Active Problem List   Diagnosis Date Noted   Hyperlipidemia 11/27/2018   Obesity (BMI 30.0-34.9) 07/11/2017   Type 2 diabetes mellitus with obesity (HCC) 06/20/2016   HTN (hypertension), benign 06/20/2016   Thyroid cancer (HCC) 04/22/2016   Thyroid adenoma 08/19/2014   Illiteracy and low-level literacy 07/14/2014   H. pylori duodenitis 07/10/2014   Duodenitis 06/25/2014   Acute pancreatitis 06/25/2014   Alcohol use 06/25/2014   Smoker 06/25/2014    Past Surgical History:  Procedure Laterality Date   COLONOSCOPY  2019   HD-MAC-suprep(good)-TA   ESOPHAGOGASTRODUODENOSCOPY N/A 06/27/2014   Procedure: ESOPHAGOGASTRODUODENOSCOPY (EGD);  Surgeon: Theda Belfast, MD;  Location: [facility];  Service: Endoscopy;  Laterality: N/A;   INGUINAL HERNIA REPAIR Left    INGUINAL HERNIA REPAIR Right    NASAL FRACTURE SURGERY  1999   not broken- had extra bones/tissue to be removed   THYROIDECTOMY Left 08/19/2014   Procedure: LEFT THYROID LOBECTOMY;  Surgeon: Drema Halon, MD;   Location: [facility];  Service: ENT;  Laterality: Left;   UMBILICAL HERNIA REPAIR         Home Medications    Prior to Admission medications   Medication Sig Start Date End Date Taking? Authorizing Provider  ketorolac (TORADOL) 10 MG tablet Take 1 tablet (10 mg total) by mouth every 6 (six) hours as needed (pain). 01/01/24  Yes Zenia Resides, MD  omeprazole (PRILOSEC) 40 MG capsule Take 1 capsule (40 mg total) by mouth daily. 01/01/24  Yes Zenia Resides, MD  atorvastatin (LIPITOR) 40 MG tablet Take 1 tablet (40 mg total) by mouth daily. 08/29/23   Hoy Register, MD  Blood Glucose Monitoring Suppl (CONTOUR NEXT MONITOR) w/Device KIT 1 each by Does not apply route daily. 01/08/18   Hoy Register, MD  buPROPion (WELLBUTRIN XL) 150 MG 24 hr tablet Take 1 tablet (150 mg total) by mouth daily for smoking cessation 08/29/23   Hoy Register, MD  glucose blood (CONTOUR NEXT TEST) test strip Use as instructed daily 01/08/18   Hoy Register, MD  Lancets Thin MISC 1 each by Does not apply route daily. 01/08/18   Hoy Register, MD  lisinopril (ZESTRIL) 10 MG tablet Take 1 tablet (10 mg total) by mouth daily. 08/29/23   Hoy Register, MD  metFORMIN (GLUCOPHAGE) 500 MG tablet Take 2 tablets (1,000 mg total) by  mouth 2 (two) times daily with a meal. 02/28/23   Hoy Register, MD  tamsulosin (FLOMAX) 0.4 MG CAPS capsule Take 2 capsules (0.8 mg total) by mouth daily. 08/29/23   Hoy Register, MD    Family History Family History  Problem Relation Age of Onset   Diabetes Mother    Healthy Mother    Healthy Father    Thyroid disease Neg Hx    Colon cancer Neg Hx    Colon polyps Neg Hx    Esophageal cancer Neg Hx    Rectal cancer Neg Hx    Stomach cancer Neg Hx     Social History Social History   Tobacco Use   Smoking status: Every Day    Current packs/day: 0.50    Types: Cigarettes   Smokeless tobacco: Never  Vaping Use   Vaping status: Never Used  Substance Use Topics    Alcohol use: Yes    Alcohol/week: 1.0 - 3.0 standard drink of alcohol    Types: 1 - 3 Standard drinks or equivalent per week    Comment: occ.    Drug use: No     Allergies   Farxiga [dapagliflozin]   Review of Systems Review of Systems   Physical Exam Triage Vital Signs ED Triage Vitals [01/01/24 1130]  Encounter Vitals Group     BP (!) 178/103     Systolic BP Percentile      Diastolic BP Percentile      Pulse Rate 78     Resp 16     Temp 98.4 F (36.9 C)     Temp Source Oral     SpO2 94 %     Weight      Height      Head Circumference      Peak Flow      Pain Score 10     Pain Loc      Pain Education      Exclude from Growth Chart    No data found.  Updated Vital Signs BP (!) 178/103 (BP Location: Right Arm)   Pulse 78   Temp 98.4 F (36.9 C) (Oral)   Resp 16   SpO2 94%   Visual Acuity Right Eye Distance:   Left Eye Distance:   Bilateral Distance:    Right Eye Near:   Left Eye Near:    Bilateral Near:     Physical Exam Vitals reviewed.  Constitutional:      General: He is not in acute distress.    Appearance: He is not ill-appearing, toxic-appearing or diaphoretic.  HENT:     Mouth/Throat:     Mouth: Mucous membranes are moist.  Eyes:     Extraocular Movements: Extraocular movements intact.     Pupils: Pupils are equal, round, and reactive to light.  Cardiovascular:     Rate and Rhythm: Normal rate and regular rhythm.  Pulmonary:     Effort: Pulmonary effort is normal.     Breath sounds: Normal breath sounds.  Musculoskeletal:     Cervical back: Neck supple.     Comments: The trapezius is not tender.  There is some mild tenderness over the left shoulder joint.  Range of motion increases are intact at the left wrist.  There is a little bit of decreased sensation to light touch over the fingertips of the second third and fourth digits on the left hand.  Lymphadenopathy:     Cervical: No cervical adenopathy.  Skin:    Coloration:  Skin is  not pale.  Neurological:     Mental Status: He is alert and oriented to person, place, and time.  Psychiatric:        Behavior: Behavior normal.      UC Treatments / Results  Labs (all labs ordered are listed, but only abnormal results are displayed) Labs Reviewed - No data to display  EKG   Radiology No results found.  Procedures Procedures (including critical care time)  Medications Ordered in UC Medications  ketorolac (TORADOL) 30 MG/ML injection 30 mg (has no administration in time range)    Initial Impression / Assessment and Plan / UC Course  I have reviewed the triage vital signs and the nursing notes.  Pertinent labs & imaging results that were available during my care of the patient were reviewed by me and considered in my medical decision making (see chart for details).     By my review there are no acute bony abnormalities on the x-rays.  He is advised of radiology over read.  Toradol injection is given here and Toradol tablets are sent to the pharmacy.  I did not see has a history of duodenitis, so Prilosec is sent in for 10 days to protect his stomach.  I have asked him to follow-up with primary care  I do think he probably more likely has some cervical radiculopathy going on with the numbness in his fingers and the radiation of the pain. Final Clinical Impressions(s) / UC Diagnoses   Final diagnoses:  Acute pain of left shoulder  Cervical radiculopathy     Discharge Instructions      By my review there are no acute bony abnormalities on the x-rays. The radiologist will also read your x-ray, and if their interpretation differs significantly from mine, and the management of your condition would change, we will call you.  You have been given a shot of Toradol 30 mg today.  Ketorolac 10 mg tablets--take 1 tablet every 6 hours as needed for pain.  This is the same medicine that is in the shot we just gave you  Take omeprazole 40 mg--1 capsule daily  for stomach acid.  This medicine is intended to protect your stomach while you are taking the ketorolac tablets, as the ketorolac can be irritating to your stomach.  Please make a follow-up appointment with your primary care     ED Prescriptions     Medication Sig Dispense Auth. Provider   ketorolac (TORADOL) 10 MG tablet Take 1 tablet (10 mg total) by mouth every 6 (six) hours as needed (pain). 20 tablet Zenia Resides, MD   omeprazole (PRILOSEC) 40 MG capsule Take 1 capsule (40 mg total) by mouth daily. 10 capsule Zenia Resides, MD      PDMP not reviewed this encounter.   Zenia Resides, MD 01/01/24 1227    Zenia Resides, MD 01/01/24 [phone]

## 2024-01-01 NOTE — ED Triage Notes (Signed)
 Patient reports that he has left arm pain that starts in the left shoulder and radiates down the arm x 2 days. Patient states pain is  worse in the bicep area. Patient has numbness to the first 3 fingers.  Patient states he has been taking Aleve and the last dose was yesterday.

## 2024-01-04 ENCOUNTER — Ambulatory Visit: Attending: Physician Assistant | Admitting: Physician Assistant

## 2024-01-04 ENCOUNTER — Ambulatory Visit
Admission: RE | Admit: 2024-01-04 | Discharge: 2024-01-04 | Disposition: A | Discharge status: Home / Self Care | Source: Ambulatory Visit | Attending: Physician Assistant | Admitting: Physician Assistant

## 2024-01-04 ENCOUNTER — Encounter: Payer: Self-pay | Admitting: Physician Assistant

## 2024-01-04 ENCOUNTER — Other Ambulatory Visit: Payer: Self-pay

## 2024-01-04 VITALS — BP 173/93 | HR 81 | Temp 98.6°F | Ht 67.0 in | Wt 205.0 lb

## 2024-01-04 DIAGNOSIS — M79602 Pain in left arm: Secondary | ICD-10-CM

## 2024-01-04 DIAGNOSIS — R202 Paresthesia of skin: Secondary | ICD-10-CM

## 2024-01-04 DIAGNOSIS — Z7984 Long term (current) use of oral hypoglycemic drugs: Secondary | ICD-10-CM | POA: Diagnosis not present

## 2024-01-04 DIAGNOSIS — E1165 Type 2 diabetes mellitus with hyperglycemia: Secondary | ICD-10-CM | POA: Diagnosis not present

## 2024-01-04 DIAGNOSIS — M62838 Other muscle spasm: Secondary | ICD-10-CM

## 2024-01-04 DIAGNOSIS — Z09 Encounter for follow-up examination after completed treatment for conditions other than malignant neoplasm: Secondary | ICD-10-CM

## 2024-01-04 LAB — POCT GLYCOSYLATED HEMOGLOBIN (HGB A1C): HbA1c, POC (controlled diabetic range): 6.8 % (ref 0.0–7.0)

## 2024-01-04 LAB — GLUCOSE, POCT (MANUAL RESULT ENTRY): POC Glucose: 182 mg/dL — AB (ref 70–99)

## 2024-01-04 MED ORDER — PREDNISONE 10 MG PO TABS
ORAL_TABLET | ORAL | 0 refills | Status: AC
  Filled 2024-01-04: qty 21, 6d supply, fill #0

## 2024-01-04 MED ORDER — METHOCARBAMOL 500 MG PO TABS
1000.0000 mg | ORAL_TABLET | Freq: Three times a day (TID) | ORAL | 0 refills | Status: DC | PRN
  Filled 2024-01-04: qty 90, 15d supply, fill #0

## 2024-01-04 NOTE — Progress Notes (Signed)
 Patient ID: Dalton Arnold, male   DOB: April 29, 1966, 58 y.o.   MRN: [id number]     [name], is a 58 y.o. male  WGN:[id number]  HQI:[id number]  DOB - [birth year]  Chief Complaint  Patient presents with   Follow-up    ER f/u.  R shoulder pain to fingers - numbness x5 days        Subjective:   Dalton Arnold is a 58 y.o. male here today for a follow up visit After UC visit 3/31 for shoulder pain. pain started acutely on 3/29 in L arm/.  NKI.  His L index finger in the front has also had some numbness.  No CP/SOB.  No dizziness.  NKI.  No neck injury.  Toradol not helping at all for pain.  His job is very strenuous-many contorted lifting, pushing, activities.    Only taking metformin 1000mg  once daily.  Supposed to be taking twice daily.    From UC note 01/01/2024: Here for pain in his left shoulder. It starts in the posterior left shoulder near the joint and radiates a little inferiorly posterior to the axilla. It also radiates into his left biceps. He notes numbness in his second third and fourth digits of the left hand.    Pertinent labs & imaging results that were available during my care of the patient were reviewed by me and considered in my medical decision making (see chart for details).     By my review there are no acute bony abnormalities on the x-rays.  He is advised of radiology over read.   Toradol injection is given here and Toradol tablets are sent to the pharmacy.  I did not see has a history of duodenitis, so Prilosec is sent in for 10 days to protect his stomach.  I have asked him to follow-up with primary care   I do think he probably more likely has some cervical radiculopathy going on with the numbness in his fingers and the radiation of the pain. No problems updated.  ALLERGIES: Allergies  Allergen Reactions   Farxiga [Dapagliflozin] Rash    PAST MEDICAL HISTORY: Past Medical History:  Diagnosis Date   Diabetes mellitus without complication (HCC)     on meds   Diarrhea    Hyperlipidemia    on meds   Hypertension    on meds   Thyroid mass     MEDICATIONS AT HOME: Prior to Admission medications   Medication Sig Start Date End Date Taking? Authorizing Provider  atorvastatin (LIPITOR) 40 MG tablet Take 1 tablet (40 mg total) by mouth daily. 08/29/23  Yes Hoy Register, MD  Blood Glucose Monitoring Suppl (CONTOUR NEXT MONITOR) w/Device KIT 1 each by Does not apply route daily. 01/08/18  Yes Hoy Register, MD  buPROPion (WELLBUTRIN XL) 150 MG 24 hr tablet Take 1 tablet (150 mg total) by mouth daily for smoking cessation 08/29/23  Yes Hoy Register, MD  glucose blood (CONTOUR NEXT TEST) test strip Use as instructed daily 01/08/18  Yes Hoy Register, MD  Lancets Thin MISC 1 each by Does not apply route daily. 01/08/18  Yes Hoy Register, MD  lisinopril (ZESTRIL) 10 MG tablet Take 1 tablet (10 mg total) by mouth daily. 08/29/23  Yes Hoy Register, MD  metFORMIN (GLUCOPHAGE) 500 MG tablet Take 2 tablets (1,000 mg total) by mouth 2 (two) times daily with a meal. 02/28/23  Yes Newlin, Enobong, MD  methocarbamol (ROBAXIN) 500 MG tablet Take 2 tablets (1,000 mg total) by mouth every  8 (eight) hours as needed. 01/04/24  Yes Georgian Co M, PA-C  omeprazole (PRILOSEC) 40 MG capsule Take 1 capsule (40 mg total) by mouth daily. 01/01/24  Yes Zenia Resides, MD  predniSONE (DELTASONE) 10 MG tablet 6,5,4,3,2,1 take each day's dose in am with food 01/04/24  Yes Kelcy Baeten M, PA-C  tamsulosin (FLOMAX) 0.4 MG CAPS capsule Take 2 capsules (0.8 mg total) by mouth daily. 08/29/23  Yes Newlin, Enobong, MD    ROS: Neg HEENT Neg resp Neg cardiac Neg GI Neg GU Neg psych Neg neuro  Objective:   Vitals:   01/04/24 1353  BP: (!) 173/93  Pulse: 81  Temp: 98.6 F (37 C)  TempSrc: Oral  SpO2: 97%  Weight: 205 lb (93 kg)  Height: 5\' 7"  (1.702 m)   Exam General appearance : Awake, alert, not in any distress. Speech Clear. Not toxic  looking HEENT: Atraumatic and Normocephalic Neck: Supple, no JVD. No cervical lymphadenopathy.  Chest: Good air entry bilaterally, CTAB.  No rales/rhonchi/wheezing CVS: S1 S2 regular, no murmurs.  BUE examined.  Some point TTP on proximal biceps tendon on the L and spasm into the L trapezius.  BUE DTR<1+ B= intact.  Neg speeds and empty can.  Normal internal and external rotation.   Extremities: B/L Lower Ext shows no edema, both legs are warm to touch Neurology: Awake alert, and oriented X 3, CN II-XII intact, Non focal Skin: No Rash  Data Review Lab Results  Component Value Date   HGBA1C 6.8 01/04/2024   HGBA1C 6.6 08/29/2023   HGBA1C 6.6 02/06/2023    Assessment & Plan   1. Type 2 diabetes mellitus with hyperglycemia, unspecified whether long term insulin use (HCC) (Primary) Increase metformin to twice daily-he is only taking once daily 1000mg  - Glucose (CBG) - HgB A1c  2. Paresthesia - DG Cervical Spine Complete; Future - predniSONE (DELTASONE) 10 MG tablet; 6,5,4,3,2,1 take each day's dose in am with food  Dispense: 21 tablet; Refill: 0  3. Left arm pain -some shoulder and trapezius involvement - DG Cervical Spine Complete; Future - methocarbamol (ROBAXIN) 500 MG tablet; Take 2 tablets (1,000 mg total) by mouth every 8 (eight) hours as needed.  Dispense: 90 tablet; Refill: 0 - predniSONE (DELTASONE) 10 MG tablet; 6,5,4,3,2,1 take each day's dose in am with food  Dispense: 21 tablet; Refill: 0  4. Encounter for examination following treatment at hospital  5. Muscle spasm - methocarbamol (ROBAXIN) 500 MG tablet; Take 2 tablets (1,000 mg total) by mouth every 8 (eight) hours as needed.  Dispense: 90 tablet; Refill: 0 - predniSONE (DELTASONE) 10 MG tablet; 6,5,4,3,2,1 take each day's dose in am with food  Dispense: 21 tablet; Refill: 0    Return in about 3 months (around 04/04/2024) for PCP for chronic conditions.  The patient was given clear instructions to go to ER or  return to medical center if symptoms don't improve, worsen or new problems develop. The patient verbalized understanding. The patient was told to call to get lab results if they haven't heard anything in the next week.      Georgian Co, PA-C [facility] [place], [place] [phone]   01/04/2024, 2:33 PM

## 2024-01-04 NOTE — Patient Instructions (Addendum)
 [facility]  [address]   Stop toradol    Cervical Sprain A cervical sprain is a stretch or tear in one or more of the ligaments in the neck. Ligaments are the tissues that connect bones to each other. Cervical sprains can range from mild to severe. Severe cervical sprains can cause the spinal bones (vertebrae) in the neck to be unstable. This can result in spinal cord damage and serious nervous system problems. Healing time for a cervical sprain depends on the cause and extent of the injury. Most cervical sprains heal in 4-6 weeks. What are the causes? Cervical sprains may be caused by trauma, such as an injury from a motor vehicle accident, a fall, or a sudden forward and backward whipping movement of the head and neck (whiplash injury). Mild cervical sprains may be caused by wear and tear over time. What increases the risk? You are more likely to get a cervical sprain if: You take part in activities that have a high risk of trauma to the neck. These include contact sports, gymnastics, and diving. You have: Osteoarthritis of the spine. Poor strength and flexibility of the neck. Poor posture. You have had a neck injury in the past. You spend long periods in positions that put stress on the neck, such as sitting at a computer. What are the signs or symptoms? Symptoms of this condition include: Any of these problems in the neck, shoulders, or upper back: Pain or tenderness. Stiffness. Swelling. A burning feeling. Sudden tightening of neck muscles (spasms). Limited ability to move the neck. Headache. Dizziness. Nausea or vomiting. Weakness, numbness, or tingling in a hand or an arm. Symptoms may develop right away after injury or may develop over a few days. In some cases, symptoms may go away with treatment and return (recur) over time. How is this diagnosed? This condition may be diagnosed based on: Your symptoms, medical history, and a physical exam. Any recent  injuries or known neck problems that you have, such as arthritis in the neck. Imaging tests, such as X-rays, an MRI, or a CT scan. How is this treated? This condition is treated by resting and icing the injured area and doing physical therapy exercises to improve movement and strength. Heat therapy may be used 2-3 days after the injury if there is no swelling. Depending on the severity of your condition, treatment may also include: Keeping your neck in place (immobilized) for periods of time. This may be done using: A cervical collar. This supports your chin and the back of your head. A cervical traction device. This is a sling that holds up your head. It removes weight and pressure from your neck. Medicines for pain or other symptoms. Surgery. This is rare. Follow these instructions at home: Medicines Take over-the-counter and prescription medicines only as told by your health care provider. Ask your provider if the medicine prescribed to you: Requires you to avoid driving or using machinery. Can cause constipation. You may need to take these actions to prevent or treat constipation: Drink enough fluid to keep your pee pale yellow. Take over-the-counter or prescription medicines. Eat foods that are high in fiber, such as beans, whole grains, and fresh fruits and vegetables. Limit foods that are high in fat and processed sugars, such as fried or sweet foods. If you have a cervical collar: Wear the collar as told by your provider. Do not remove it unless told. Ask before making any adjustments to your collar. If you have long hair,  keep it outside of the collar. If you are allowed to remove the collar for cleaning and bathing: Follow instructions about how to remove it safely. Clean it by hand with mild soap and water and air-dry it completely. If your collar has removable pads, remove them every 1-2 days and wash them by hand with soap and water. Let them air-dry completely before putting  them back in the collar. Tell your provider if your skin under the collar has irritation or sores. Managing pain, stiffness, and swelling     Use a cervical traction device as told. If told, put ice on the affected area. Put ice in a plastic bag. Place a towel between your skin and the bag. Leave the ice on for 20 minutes, 2-3 times a day. If told, apply heat to the affected area before you exercise or as often as told by your provider. Use the heat source that your provider recommends, such as a moist heat pack or a heating pad. Place a towel between your skin and the heat source. Leave the heat on for 20-30 minutes. If your skin turns bright red, remove the ice or heat right away to prevent skin damage. The risk of damage is higher if you cannot feel pain, heat, or cold. Activity Do not drive while wearing a cervical collar. If you do not have a cervical collar, ask if it is safe to drive while your neck heals. Do not lift anything that is heavier than 10 lb (4.5 kg) until your provider says that it is safe. Rest as told by your provider. Avoid positions and activities that make your symptoms worse. Do physical therapy exercises as told by your provider or physical therapist. Return to your normal activities as told by your provider. Ask your provider what activities are safe for you. General instructions Do not use any products that contain nicotine or tobacco. These products include cigarettes, chewing tobacco, and vaping devices, such as e-cigarettes. These can delay healing. If you need help quitting, ask your provider. Keep all follow-up visits. Your provider will monitor your injury and activity level. How is this prevented? To prevent a cervical sprain from happening again: Use and maintain good posture. Make any needed adjustments to your workstation to help you do this. Exercise regularly as told by your provider or physical therapist. Avoid risky activities that may cause a  cervical sprain. Contact a health care provider if: You have symptoms that get worse or do not get better after 2 weeks of treatment. You have new symptoms. Your pain gets worse or does not get better with medicine. You have sores or irritated skin on your neck from wearing your cervical collar. Get help right away if: You have severe pain. You develop numbness, tingling, or weakness in any part of your body. You cannot move a part of your body (you have paralysis). You have neck pain along with severe dizziness or headache. This information is not intended to replace advice given to you by your health care provider. Make sure you discuss any questions you have with your health care provider. Document Revised: 04/22/2022 Document Reviewed: 04/22/2022 Elsevier Patient Education  2024 [employer].

## 2024-01-09 ENCOUNTER — Ambulatory Visit: Admitting: Physician Assistant

## 2024-01-11 ENCOUNTER — Telehealth: Payer: Self-pay

## 2024-01-11 NOTE — Telephone Encounter (Signed)
 Routing to PCP for review.

## 2024-01-11 NOTE — Telephone Encounter (Signed)
 I recommend adding Tylenol extra strength 500 mg 3 times a day as needed with the muscle relaxant.  I can refer him to orthopedics as well since he has been seen twice and not better.

## 2024-01-11 NOTE — Telephone Encounter (Signed)
 Patient came in stating the medication he got methocarbamol (ROBAXIN) 500 MG tablet does not work for his shoulder pain. Patient is requesting a different prescription or a higher dosage.

## 2024-01-11 NOTE — Telephone Encounter (Signed)
 Patient was called and given information from provider. Contact information given to the patient for [facility].

## 2024-02-27 ENCOUNTER — Ambulatory Visit: Payer: Managed Care, Other (non HMO) | Admitting: Family Medicine

## 2024-04-01 ENCOUNTER — Ambulatory Visit: Attending: Family Medicine | Admitting: Family Medicine

## 2024-04-01 ENCOUNTER — Other Ambulatory Visit (HOSPITAL_COMMUNITY): Payer: Self-pay

## 2024-04-01 ENCOUNTER — Encounter: Payer: Self-pay | Admitting: Family Medicine

## 2024-04-01 VITALS — BP 129/85 | HR 75 | Ht 67.0 in | Wt 207.2 lb

## 2024-04-01 DIAGNOSIS — E785 Hyperlipidemia, unspecified: Secondary | ICD-10-CM

## 2024-04-01 DIAGNOSIS — I10 Essential (primary) hypertension: Secondary | ICD-10-CM

## 2024-04-01 DIAGNOSIS — E1169 Type 2 diabetes mellitus with other specified complication: Secondary | ICD-10-CM

## 2024-04-01 DIAGNOSIS — E1165 Type 2 diabetes mellitus with hyperglycemia: Secondary | ICD-10-CM | POA: Diagnosis not present

## 2024-04-01 DIAGNOSIS — Z125 Encounter for screening for malignant neoplasm of prostate: Secondary | ICD-10-CM

## 2024-04-01 DIAGNOSIS — I152 Hypertension secondary to endocrine disorders: Secondary | ICD-10-CM

## 2024-04-01 DIAGNOSIS — Z7984 Long term (current) use of oral hypoglycemic drugs: Secondary | ICD-10-CM

## 2024-04-01 DIAGNOSIS — R399 Unspecified symptoms and signs involving the genitourinary system: Secondary | ICD-10-CM | POA: Diagnosis not present

## 2024-04-01 MED ORDER — TAMSULOSIN HCL 0.4 MG PO CAPS
0.4000 mg | ORAL_CAPSULE | Freq: Every day | ORAL | 1 refills | Status: DC
  Filled 2024-04-01: qty 90, 90d supply, fill #0

## 2024-04-01 MED ORDER — METFORMIN HCL 500 MG PO TABS
500.0000 mg | ORAL_TABLET | Freq: Two times a day (BID) | ORAL | 1 refills | Status: DC
  Filled 2024-04-01: qty 180, 90d supply, fill #0
  Filled 2024-06-13: qty 60, 30d supply, fill #0

## 2024-04-01 NOTE — Patient Instructions (Addendum)
 [facility]: Address: [address] [place], [place], [place] [address] Phone: [phone] Hours:  Open ? Closes 7?PM

## 2024-04-01 NOTE — Progress Notes (Signed)
 Subjective:  Patient ID: Dalton Arnold, male    DOB: 14-Nov-1965  Age: 58 y.o. MRN: [id number]  CC: Medical Management of Chronic Issues     Discussed the use of AI scribe software for clinical note transcription with the patient, who gave verbal consent to proceed.  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  His diabetes is well-controlled with an A1c of 6.8% in April. He takes metformin  500 mg twice daily without concerns. He takes lisinopril  10 mg occasionally due to fatigue and dizziness, suspecting low blood pressure. He lacks a home blood pressure monitor but reports normal readings during blood donations. He denies chest pain, exercise fatigue, or respiratory symptoms.  He is prescribed atorvastatin  40 mg for hyperlipidemia but takes it inconsistently. LDL level were slightly above goal previously. He takes tamsulosin  occasionally for urinary symptoms related to BPH, with noted improvement. He has reduced smoking from a pack and a half per day to one pack every three days. He runs three miles daily without chest pain or fatigue.  A CT scan for lung cancer screening is pending.    Past Medical History:  Diagnosis Date   Diabetes mellitus without complication (HCC)    on meds   Diarrhea    Hyperlipidemia    on meds   Hypertension    on meds   Thyroid  mass     Past Surgical History:  Procedure Laterality Date   COLONOSCOPY  2019   HD-MAC-suprep(good)-TA   ESOPHAGOGASTRODUODENOSCOPY N/A 06/27/2014   Procedure: ESOPHAGOGASTRODUODENOSCOPY (EGD);  Surgeon: Belvie JONETTA Just, MD;  Location: [facility];  Service: Endoscopy;   Laterality: N/A;   INGUINAL HERNIA REPAIR Left    INGUINAL HERNIA REPAIR Right    NASAL FRACTURE SURGERY  1999   not broken- had extra bones/tissue to be removed   THYROIDECTOMY Left 08/19/2014   Procedure: LEFT THYROID  LOBECTOMY;  Surgeon: Lonni FORBES Angle, MD;  Location: [facility];  Service: ENT;  Laterality: Left;   UMBILICAL HERNIA REPAIR      Family History  Problem Relation Age of Onset   Diabetes Mother    Healthy Mother    Healthy Father    Thyroid  disease Neg Hx    Colon cancer Neg Hx    Colon polyps Neg Hx    Esophageal cancer Neg Hx    Rectal cancer Neg Hx    Stomach cancer Neg Hx     Social History   Socioeconomic History   Marital status: Divorced    Spouse name: Not on file   Number of children: Not on file   Years of education: Not on file   Highest education level: Not on file  Occupational History   Not on file  Tobacco Use   Smoking status: Every Day    Current packs/day: 0.50    Types: Cigarettes   Smokeless tobacco: Never  Vaping Use   Vaping status: Never Used  Substance and Sexual Activity   Alcohol use: Yes    Alcohol/week: 1.0 - 3.0 standard drink of alcohol    Types: 1 - 3 Standard drinks or equivalent per week    Comment: occ.    Drug use:  No   Sexual activity: Yes  Other Topics Concern   Not on file  Social History Narrative   Not on file   Social Drivers of Health   Financial Resource Strain: Not on file  Food Insecurity: Not on file  Transportation Needs: Not on file  Physical Activity: Not on file  Stress: Not on file  Social Connections: Not on file    Allergies  Allergen Reactions   Farxiga  [Dapagliflozin ] Rash    Outpatient Medications Prior to Visit  Medication Sig Dispense Refill   atorvastatin  (LIPITOR) 40 MG tablet Take 1 tablet (40 mg total) by mouth daily. 30 tablet 6   Blood Glucose Monitoring Suppl (CONTOUR NEXT MONITOR) w/Device KIT 1 each by Does not apply route daily. 1 kit 0   buPROPion  (WELLBUTRIN   XL) 150 MG 24 hr tablet Take 1 tablet (150 mg total) by mouth daily for smoking cessation 90 tablet 1   glucose blood (CONTOUR NEXT TEST) test strip Use as instructed daily 30 each 12   Lancets Thin MISC 1 each by Does not apply route daily. 30 each 12   lisinopril  (ZESTRIL ) 10 MG tablet Take 1 tablet (10 mg total) by mouth daily. 90 tablet 1   methocarbamol  (ROBAXIN ) 500 MG tablet Take 2 tablets (1,000 mg total) by mouth every 8 (eight) hours as needed. 90 tablet 0   omeprazole  (PRILOSEC) 40 MG capsule Take 1 capsule (40 mg total) by mouth daily. 10 capsule 0   metFORMIN  (GLUCOPHAGE ) 500 MG tablet Take 2 tablets (1,000 mg total) by mouth 2 (two) times daily with a meal. 180 tablet 1   tamsulosin  (FLOMAX ) 0.4 MG CAPS capsule Take 2 capsules (0.8 mg total) by mouth daily. 60 capsule 6   No facility-administered medications prior to visit.     ROS Review of Systems  Constitutional:  Negative for activity change and appetite change.  HENT:  Negative for sinus pressure and sore throat.   Respiratory:  Negative for chest tightness, shortness of breath and wheezing.   Cardiovascular:  Negative for chest pain and palpitations.  Gastrointestinal:  Negative for abdominal distention, abdominal pain and constipation.  Genitourinary: Negative.   Musculoskeletal: Negative.   Psychiatric/Behavioral:  Negative for behavioral problems and dysphoric mood.     Objective:  BP 129/85   Pulse 75   Ht 5' 7 (1.702 m)   Wt 207 lb 3.2 oz (94 kg)   SpO2 97%   BMI 32.45 kg/m      04/01/2024    2:20 PM 01/04/2024    1:53 PM 01/01/2024   11:30 AM  BP/Weight  Systolic BP 129 173 178  Diastolic BP 85 93 103  Wt. (Lbs) 207.2 205   BMI 32.45 kg/m2 32.11 kg/m2       Physical Exam Constitutional:      Appearance: He is well-developed.   Cardiovascular:     Rate and Rhythm: Normal rate.     Heart sounds: Normal heart sounds. No murmur heard. Pulmonary:     Effort: Pulmonary effort is normal.      Breath sounds: Normal breath sounds. No wheezing or rales.  Chest:     Chest wall: No tenderness.  Abdominal:     General: Bowel sounds are normal. There is no distension.     Palpations: Abdomen is soft. There is no mass.     Tenderness: There is no abdominal tenderness.   Musculoskeletal:        General: Normal range of motion.  Right lower leg: No edema.     Left lower leg: No edema.   Neurological:     Mental Status: He is alert and oriented to person, place, and time.   Psychiatric:        Mood and Affect: Mood normal.        Latest Ref Rng & Units 08/29/2023   11:28 AM 02/28/2023   10:31 AM 02/06/2023   10:42 AM  CMP  Glucose 70 - 99 mg/dL 91  877  880   BUN 6 - 24 mg/dL 16  19  19    Creatinine 0.76 - 1.27 mg/dL 9.10  9.08  9.04   Sodium 134 - 144 mmol/L 138  137  139   Potassium 3.5 - 5.2 mmol/L 5.1  4.8  4.9   Chloride 96 - 106 mmol/L 104  103  104   CO2 20 - 29 mmol/L 23  18  18    Calcium  8.7 - 10.2 mg/dL 9.4  9.3  9.5   Total Protein 6.0 - 8.5 g/dL   7.1   Total Bilirubin 0.0 - 1.2 mg/dL   <9.7   Alkaline Phos 44 - 121 IU/L   89   AST 0 - 40 IU/L   20   ALT 0 - 44 IU/L   23     Lipid Panel     Component Value Date/Time   CHOL 170 02/06/2023 1042   TRIG 133 02/06/2023 1042   HDL 41 02/06/2023 1042   CHOLHDL 5.4 (H) 11/26/2020 1135   CHOLHDL 6.2 (H) 04/11/2016 1009   VLDL 30 04/11/2016 1009   LDLCALC 105 (H) 02/06/2023 1042    CBC    Component Value Date/Time   WBC 11.7 (H) 11/26/2020 1135   WBC 11.8 (H) 07/19/2019 1705   RBC 5.80 11/26/2020 1135   RBC 5.87 (H) 07/19/2019 1705   HGB 15.2 11/26/2020 1135   HCT 46.5 11/26/2020 1135   PLT 376 11/26/2020 1135   MCV 80 11/26/2020 1135   MCH 26.2 (L) 11/26/2020 1135   MCH 25.2 (L) 07/19/2019 1705   MCHC 32.7 11/26/2020 1135   MCHC 33.3 07/19/2019 1705   RDW 14.9 11/26/2020 1135   LYMPHSABS 2.8 11/26/2020 1135   MONOABS 0.8 07/19/2019 1705   EOSABS 0.5 (H) 11/26/2020 1135   BASOSABS 0.1  11/26/2020 1135    Lab Results  Component Value Date   HGBA1C 6.8 01/04/2024    Lab Results  Component Value Date   HGBA1C 6.8 01/04/2024   HGBA1C 6.6 08/29/2023   HGBA1C 6.6 02/06/2023      1. Lower urinary tract symptoms Decreasing dose of tamsulosin  from 0.8 mg to 0.4 mg since he had complained of some dizziness which might be secondary to possible hypotension - tamsulosin  (FLOMAX ) 0.4 MG CAPS capsule; Take 1 capsule (0.4 mg total) by mouth daily.  Dispense: 90 capsule; Refill: 1  2. Type 2 diabetes mellitus with hyperglycemia, unspecified whether long term insulin use (HCC) (Primary) Controlled with A1c of 6.8 Continue current regimen Will check A1c in 3 months Counseled on Diabetic diet, my plate method, 849 minutes of moderate intensity exercise/week Blood sugar logs with fasting goals of 80-120 mg/dl, random of less than 819 and in the event of sugars less than 60 mg/dl or greater than 599 mg/dl encouraged to notify the clinic. Advised on the need for annual eye exams, annual foot exams, Pneumonia vaccine. - Microalbumin / creatinine urine ratio; Future - metFORMIN  (GLUCOPHAGE ) 500 MG tablet; Take  1 tablet (500 mg total) by mouth 2 (two) times daily with a meal.  Dispense: 180 tablet; Refill: 1  3. Hyperlipidemia associated with type 2 diabetes mellitus (HCC) LDL of 105 above goal of less than 100 Inconsistent with regards to atorvastatin  Medication adherence emphasized; will make no regimen changes if uncontrolled given he has not been consistent. Low-cholesterol diet - LP+Non-HDL Cholesterol; Future  4. Hypertension associated with diabetes (HCC) Controlled Advised to obtain a blood pressure monitor and check blood pressures at home since he complains of dizziness Continue lisinopril  Counseled on blood pressure goal of less than 130/80, low-sodium, DASH diet, medication compliance, 150 minutes of moderate intensity exercise per week. Discussed medication compliance,  adverse effects. - CMP14+EGFR; Future  5. Screening for prostate cancer - PSA, total and free; Future   Meds ordered this encounter  Medications   metFORMIN  (GLUCOPHAGE ) 500 MG tablet    Sig: Take 1 tablet (500 mg total) by mouth 2 (two) times daily with a meal.    Dispense:  180 tablet    Refill:  1   tamsulosin  (FLOMAX ) 0.4 MG CAPS capsule    Sig: Take 1 capsule (0.4 mg total) by mouth daily.    Dispense:  90 capsule    Refill:  1    Dose increase    Follow-up: Return in about 3 months (around 07/02/2024) for Chronic medical conditions.       Corrina Sabin, MD, FAAFP. [facility] [place], [place] [phone]   04/01/2024, 6:03 PM

## 2024-04-11 ENCOUNTER — Other Ambulatory Visit (HOSPITAL_COMMUNITY): Payer: Self-pay

## 2024-06-13 ENCOUNTER — Other Ambulatory Visit: Payer: Self-pay

## 2024-07-02 ENCOUNTER — Ambulatory Visit: Admitting: Family Medicine

## 2024-07-02 ENCOUNTER — Telehealth: Payer: Self-pay | Admitting: Family Medicine

## 2024-07-02 NOTE — Telephone Encounter (Addendum)
 Called patient for No Show, appointment rescheduled.

## 2024-07-30 ENCOUNTER — Ambulatory Visit: Admitting: Family Medicine

## 2024-08-13 ENCOUNTER — Encounter: Payer: Self-pay | Admitting: Family Medicine

## 2024-08-13 ENCOUNTER — Ambulatory Visit: Attending: Family Medicine | Admitting: Family Medicine

## 2024-08-13 ENCOUNTER — Other Ambulatory Visit: Payer: Self-pay

## 2024-08-13 VITALS — BP 150/91 | HR 71 | Temp 98.3°F | Ht 67.0 in | Wt 213.0 lb

## 2024-08-13 DIAGNOSIS — E1159 Type 2 diabetes mellitus with other circulatory complications: Secondary | ICD-10-CM | POA: Diagnosis not present

## 2024-08-13 DIAGNOSIS — F17210 Nicotine dependence, cigarettes, uncomplicated: Secondary | ICD-10-CM

## 2024-08-13 DIAGNOSIS — E1169 Type 2 diabetes mellitus with other specified complication: Secondary | ICD-10-CM

## 2024-08-13 DIAGNOSIS — Z7984 Long term (current) use of oral hypoglycemic drugs: Secondary | ICD-10-CM | POA: Diagnosis not present

## 2024-08-13 DIAGNOSIS — E785 Hyperlipidemia, unspecified: Secondary | ICD-10-CM

## 2024-08-13 DIAGNOSIS — H0260 Xanthelasma of unspecified eye, unspecified eyelid: Secondary | ICD-10-CM

## 2024-08-13 DIAGNOSIS — Z23 Encounter for immunization: Secondary | ICD-10-CM

## 2024-08-13 DIAGNOSIS — I152 Hypertension secondary to endocrine disorders: Secondary | ICD-10-CM

## 2024-08-13 DIAGNOSIS — E1165 Type 2 diabetes mellitus with hyperglycemia: Secondary | ICD-10-CM

## 2024-08-13 DIAGNOSIS — I10 Essential (primary) hypertension: Secondary | ICD-10-CM

## 2024-08-13 LAB — POCT GLYCOSYLATED HEMOGLOBIN (HGB A1C): HbA1c, POC (controlled diabetic range): 7 % (ref 0.0–7.0)

## 2024-08-13 MED ORDER — METFORMIN HCL 500 MG PO TABS
500.0000 mg | ORAL_TABLET | Freq: Two times a day (BID) | ORAL | 1 refills | Status: AC
  Filled 2024-08-13: qty 60, 30d supply, fill #0
  Filled 2024-10-21: qty 60, 30d supply, fill #1

## 2024-08-13 MED ORDER — LISINOPRIL 10 MG PO TABS
10.0000 mg | ORAL_TABLET | Freq: Every day | ORAL | 1 refills | Status: AC
  Filled 2024-08-13: qty 30, 30d supply, fill #0
  Filled 2024-10-21: qty 30, 30d supply, fill #1

## 2024-08-13 NOTE — Patient Instructions (Addendum)
[facility] [place]

## 2024-08-13 NOTE — Progress Notes (Signed)
 Subjective:  Patient ID: Dalton Arnold, male    DOB: 16-Mar-1966  Age: 58 y.o. MRN: [id number]  CC: Medical Management of Chronic Issues     Discussed the use of AI scribe software for clinical note transcription with the patient, who gave verbal consent to proceed.  History of Present Illness Dalton Arnold is a 58 year old male with a history of type 2 diabetes mellitus, hypertension, thyroid  adenocarcinoma diagnosed in 08/2014 (status post left lobectomy, last seen by endocrine in 04/2016 and declined complete lobectomy and radioactive iodine therapy), BPH, Nicotine  dependence (1 ppd since the age of 74) who presents for follow-up of his chronic conditions.  Dizziness resolved after decreasing the dose of tamsulosin , which he no longer takes due to the absence of urinary symptoms with his BPH. Cholesterol medication has not been taken regularly, with a lapse of two weeks due to running out. Previous cholesterol levels were elevated, and he is awaiting a refill. Metformin  is taken once daily instead of twice due to forgetfulness.  A1c is 7.0 up from 6.8 previously.  He runs daily and uses reading glasses for near vision.  He continues to smoke one pack of cigarettes per day and has not used the prescribed smoking cessation medication. He is concerned about small knots near his eye, suspected to be cholesterol deposits, and is uncertain about insurance coverage for removal.    Past Medical History:  Diagnosis Date   Diabetes mellitus without complication (HCC)    on meds   Diarrhea    Hyperlipidemia    on meds   Hypertension    on meds   Thyroid  mass     Past Surgical History:  Procedure Laterality Date   COLONOSCOPY  2019   HD-MAC-suprep(good)-TA   ESOPHAGOGASTRODUODENOSCOPY N/A 06/27/2014   Procedure: ESOPHAGOGASTRODUODENOSCOPY (EGD);  Surgeon: Belvie JONETTA Just, MD;  Location: [facility];  Service: Endoscopy;  Laterality: N/A;   INGUINAL HERNIA REPAIR Left    INGUINAL  HERNIA REPAIR Right    NASAL FRACTURE SURGERY  1999   not broken- had extra bones/tissue to be removed   THYROIDECTOMY Left 08/19/2014   Procedure: LEFT THYROID  LOBECTOMY;  Surgeon: Lonni FORBES Angle, MD;  Location: [facility];  Service: ENT;  Laterality: Left;   UMBILICAL HERNIA REPAIR      Family History  Problem Relation Age of Onset   Diabetes Mother    Healthy Mother    Healthy Father    Thyroid  disease Neg Hx    Colon cancer Neg Hx    Colon polyps Neg Hx    Esophageal cancer Neg Hx    Rectal cancer Neg Hx    Stomach cancer Neg Hx     Social History   Socioeconomic History   Marital status: Divorced    Spouse name: Not on file   Number of children: Not on file   Years of education: Not on file   Highest education level: Not on file  Occupational History   Not on file  Tobacco Use   Smoking status: Every Day    Current packs/day: 0.50    Types: Cigarettes   Smokeless tobacco: Never  Vaping Use   Vaping status: Never Used  Substance and Sexual Activity   Alcohol use: Yes    Alcohol/week: 1.0 - 3.0 standard drink of alcohol    Types: 1 - 3 Standard drinks or equivalent per week    Comment: occ.    Drug use: No   Sexual activity:  Yes  Other Topics Concern   Not on file  Social History Narrative   Not on file   Social Drivers of Health   Financial Resource Strain: Not on file  Food Insecurity: Not on file  Transportation Needs: Not on file  Physical Activity: Not on file  Stress: Not on file  Social Connections: Not on file    Allergies  Allergen Reactions   Farxiga  [Dapagliflozin ] Rash    Outpatient Medications Prior to Visit  Medication Sig Dispense Refill   atorvastatin  (LIPITOR) 40 MG tablet Take 1 tablet (40 mg total) by mouth daily. 30 tablet 6   Blood Glucose Monitoring Suppl (CONTOUR NEXT MONITOR) w/Device KIT 1 each by Does not apply route daily. 1 kit 0   buPROPion  (WELLBUTRIN  XL) 150 MG 24 hr tablet Take 1 tablet (150 mg total) by mouth  daily for smoking cessation 90 tablet 1   glucose blood (CONTOUR NEXT TEST) test strip Use as instructed daily 30 each 12   Lancets Thin MISC 1 each by Does not apply route daily. 30 each 12   omeprazole  (PRILOSEC) 40 MG capsule Take 1 capsule (40 mg total) by mouth daily. 10 capsule 0   lisinopril  (ZESTRIL ) 10 MG tablet Take 1 tablet (10 mg total) by mouth daily. 90 tablet 1   metFORMIN  (GLUCOPHAGE ) 500 MG tablet Take 1 tablet (500 mg total) by mouth 2 (two) times daily with a meal. 180 tablet 1   methocarbamol  (ROBAXIN ) 500 MG tablet Take 2 tablets (1,000 mg total) by mouth every 8 (eight) hours as needed. (Patient not taking: Reported on 08/13/2024) 90 tablet 0   tamsulosin  (FLOMAX ) 0.4 MG CAPS capsule Take 1 capsule (0.4 mg total) by mouth daily. (Patient not taking: Reported on 08/13/2024) 90 capsule 1   No facility-administered medications prior to visit.     ROS Review of Systems  Constitutional:  Negative for activity change and appetite change.  HENT:  Negative for sinus pressure and sore throat.   Respiratory:  Negative for chest tightness, shortness of breath and wheezing.   Cardiovascular:  Negative for chest pain and palpitations.  Gastrointestinal:  Negative for abdominal distention, abdominal pain and constipation.  Genitourinary: Negative.   Musculoskeletal: Negative.   Skin:  Positive for rash.  Psychiatric/Behavioral:  Negative for behavioral problems and dysphoric mood.     Objective:  BP (!) 150/91   Pulse 71   Temp 98.3 F (36.8 C) (Oral)   Ht 5' 7 (1.702 m)   Wt 213 lb (96.6 kg)   SpO2 98%   BMI 33.36 kg/m      08/13/2024   10:00 AM 08/13/2024    9:25 AM 04/01/2024    2:20 PM  BP/Weight  Systolic BP 150 141 129  Diastolic BP 91 89 85  Wt. (Lbs)  213 207.2  BMI  33.36 kg/m2 32.45 kg/m2      Physical Exam Constitutional:      Appearance: He is well-developed.  Cardiovascular:     Rate and Rhythm: Normal rate.     Heart sounds: Normal heart  sounds. No murmur heard. Pulmonary:     Effort: Pulmonary effort is normal.     Breath sounds: Normal breath sounds. No wheezing or rales.  Chest:     Chest wall: No tenderness.  Abdominal:     General: Bowel sounds are normal. There is no distension.     Palpations: Abdomen is soft. There is no mass.     Tenderness: There is  no abdominal tenderness.  Musculoskeletal:        General: Normal range of motion.     Right lower leg: No edema.     Left lower leg: No edema.  Skin:    Comments: Cluster of tiny nodules beneath medial canthus of right eye and another cluster slightly inferior to her right lower eyelid  Neurological:     Mental Status: He is alert and oriented to person, place, and time.  Psychiatric:        Mood and Affect: Mood normal.        Latest Ref Rng & Units 08/29/2023   11:28 AM 02/28/2023   10:31 AM 02/06/2023   10:42 AM  CMP  Glucose 70 - 99 mg/dL 91  877  880   BUN 6 - 24 mg/dL 16  19  19    Creatinine 0.76 - 1.27 mg/dL 9.10  9.08  9.04   Sodium 134 - 144 mmol/L 138  137  139   Potassium 3.5 - 5.2 mmol/L 5.1  4.8  4.9   Chloride 96 - 106 mmol/L 104  103  104   CO2 20 - 29 mmol/L 23  18  18    Calcium  8.7 - 10.2 mg/dL 9.4  9.3  9.5   Total Protein 6.0 - 8.5 g/dL   7.1   Total Bilirubin 0.0 - 1.2 mg/dL   <9.7   Alkaline Phos 44 - 121 IU/L   89   AST 0 - 40 IU/L   20   ALT 0 - 44 IU/L   23     Lipid Panel     Component Value Date/Time   CHOL 170 02/06/2023 1042   TRIG 133 02/06/2023 1042   HDL 41 02/06/2023 1042   CHOLHDL 5.4 (H) 11/26/2020 1135   CHOLHDL 6.2 (H) 04/11/2016 1009   VLDL 30 04/11/2016 1009   LDLCALC 105 (H) 02/06/2023 1042    CBC    Component Value Date/Time   WBC 11.7 (H) 11/26/2020 1135   WBC 11.8 (H) 07/19/2019 1705   RBC 5.80 11/26/2020 1135   RBC 5.87 (H) 07/19/2019 1705   HGB 15.2 11/26/2020 1135   HCT 46.5 11/26/2020 1135   PLT 376 11/26/2020 1135   MCV 80 11/26/2020 1135   MCH 26.2 (L) 11/26/2020 1135   MCH 25.2 (L)  07/19/2019 1705   MCHC 32.7 11/26/2020 1135   MCHC 33.3 07/19/2019 1705   RDW 14.9 11/26/2020 1135   LYMPHSABS 2.8 11/26/2020 1135   MONOABS 0.8 07/19/2019 1705   EOSABS 0.5 (H) 11/26/2020 1135   BASOSABS 0.1 11/26/2020 1135    Lab Results  Component Value Date   HGBA1C 7.0 08/13/2024       Assessment & Plan Type 2 diabetes mellitus A1c increased from 6.8 to 7.0, likely due to non-adherence to metformin  regimen. - Instructed to take metformin  500 mg orally twice daily with meals rather than once a day which he has been taking - Ordered referral for annual eye exam to assess for diabetic retinopathy. -Counseled on Diabetic diet, the healthy plate, 849 minutes of moderate intensity exercise/week Blood sugar logs with fasting goals of 80-120 mg/dl, random of less than 819 and in the event of sugars less than 60 mg/dl or greater than 599 mg/dl encouraged to notify the clinic. Advised on the need for annual eye exams, annual foot exams, Pneumonia vaccine.   Hypertension associated with type 2 diabetes mellitus Blood pressure slightly elevated at 141/89 mmHg which increased to  150/91 upon repeat. Discontinued tamsulosin , which may have contributed to elevated blood pressure. - Rechecked blood pressure. - Will follow up in 3 months to monitor blood pressure. - Will consider adding low dose amlodipine if blood pressure remains elevated. - Advised on low sodium diet and exercise.  Hyperlipidemia associated with type 2 diabetes mellitus Cholesterol levels previously elevated. Ran out of atorvastatin  two weeks ago and requires refill. - Ordered lipid panel. - Will send refill for atorvastatin  after reviewing lipid panel results.  Smoking greater than 20-pack-year history Continues to smoke one pack per day. Uncertain about readiness to quit smoking. - Discussed readiness to quit smoking and offered support. -Smoking cessation support: smoking cessation hotline: 1-800-QUIT-NOW.  Smoking  cessation classes are available through [facility]. Call [phone] or visit our website at [email or web address].  Spent 3 minutes counseling on dangers of tobacco use and benefits of quitting, offered pharmacological intervention to aid quitting and patient is not ready to quit. CT scan of the lungs ordered for lung cancer screening due to smoking history. Did not complete scan due to confusion about location. - Reordered CT scan of the lungs. - Provided address for CT scan location.   Xanthelasma Offered the option of referral to dermatology for possible evaluation and excision but he would like to hold off at this time as he is unsure of insurance coverage.  General Health Maintenance Due for flu shot and routine blood work. Need for immunization against influenza- Administered flu shot. - Ordered comprehensive metabolic panel, lipid panel, and kidney and liver function tests.     Meds ordered this encounter  Medications   lisinopril  (ZESTRIL ) 10 MG tablet    Sig: Take 1 tablet (10 mg total) by mouth daily.    Dispense:  90 tablet    Refill:  1   metFORMIN  (GLUCOPHAGE ) 500 MG tablet    Sig: Take 1 tablet (500 mg total) by mouth 2 (two) times daily with a meal.    Dispense:  180 tablet    Refill:  1    Follow-up: Return in about 3 months (around 11/13/2024) for Chronic medical conditions.       Corrina Sabin, MD, FAAFP. [facility] [place], [place] [phone]   08/13/2024, 12:17 PM

## 2024-08-15 ENCOUNTER — Telehealth (HOSPITAL_COMMUNITY): Payer: Self-pay

## 2024-08-15 ENCOUNTER — Other Ambulatory Visit (HOSPITAL_COMMUNITY): Payer: Self-pay

## 2024-08-15 ENCOUNTER — Ambulatory Visit: Payer: Self-pay | Admitting: Family Medicine

## 2024-08-15 DIAGNOSIS — E1169 Type 2 diabetes mellitus with other specified complication: Secondary | ICD-10-CM

## 2024-08-15 MED ORDER — ATORVASTATIN CALCIUM 80 MG PO TABS
80.0000 mg | ORAL_TABLET | Freq: Every day | ORAL | 1 refills | Status: AC
  Filled 2024-08-15 – 2024-08-21 (×2): qty 90, 90d supply, fill #0

## 2024-08-15 MED ORDER — KERENDIA 10 MG PO TABS
10.0000 mg | ORAL_TABLET | Freq: Every day | ORAL | 1 refills | Status: DC
  Filled 2024-08-15: qty 90, 90d supply, fill #0
  Filled 2024-08-16: qty 30, 30d supply, fill #0

## 2024-08-16 ENCOUNTER — Other Ambulatory Visit: Payer: Self-pay

## 2024-08-16 LAB — CMP14+EGFR
ALT: 29 IU/L (ref 0–44)
AST: 20 IU/L (ref 0–40)
Albumin: 4.3 g/dL (ref 3.8–4.9)
Alkaline Phosphatase: 87 IU/L (ref 47–123)
BUN/Creatinine Ratio: 17 (ref 10–24)
BUN: 14 mg/dL (ref 8–27)
Bilirubin Total: 0.3 mg/dL (ref 0.0–1.2)
CO2: 21 mmol/L (ref 20–29)
Calcium: 9 mg/dL (ref 8.6–10.2)
Chloride: 104 mmol/L (ref 96–106)
Creatinine, Ser: 0.84 mg/dL (ref 0.76–1.27)
Globulin, Total: 2.6 g/dL (ref 1.5–4.5)
Glucose: 114 mg/dL — ABNORMAL HIGH (ref 70–99)
Potassium: 4.5 mmol/L (ref 3.5–5.2)
Sodium: 139 mmol/L (ref 134–144)
Total Protein: 6.9 g/dL (ref 6.0–8.5)
eGFR: 100 mL/min/1.73 (ref >59)

## 2024-08-16 LAB — LP+NON-HDL CHOLESTEROL
Cholesterol, Total: 263 mg/dL — ABNORMAL HIGH (ref 100–199)
HDL: 41 mg/dL (ref >39)
LDL Chol Calc (NIH): 179 mg/dL — ABNORMAL HIGH (ref 0–99)
Total Non-HDL-Chol (LDL+VLDL): 222 mg/dL — ABNORMAL HIGH (ref 0–129)
Triglycerides: 225 mg/dL — ABNORMAL HIGH (ref 0–149)
VLDL Cholesterol Cal: 43 mg/dL — ABNORMAL HIGH (ref 5–40)

## 2024-08-16 LAB — MICROALBUMIN / CREATININE URINE RATIO
Creatinine, Urine: 86.6 mg/dL
Microalb/Creat Ratio: 132 mg/g{creat} — ABNORMAL HIGH (ref 0–29)
Microalbumin, Urine: 114.6 ug/mL

## 2024-08-21 ENCOUNTER — Other Ambulatory Visit (HOSPITAL_COMMUNITY): Payer: Self-pay

## 2024-08-21 ENCOUNTER — Other Ambulatory Visit: Payer: Self-pay

## 2024-08-22 ENCOUNTER — Other Ambulatory Visit

## 2024-08-23 ENCOUNTER — Other Ambulatory Visit: Payer: Self-pay

## 2024-08-23 MED ORDER — KERENDIA 10 MG PO TABS
10.0000 mg | ORAL_TABLET | Freq: Every day | ORAL | 1 refills | Status: AC
  Filled 2024-08-23: qty 90, 90d supply, fill #0

## 2024-09-20 ENCOUNTER — Other Ambulatory Visit: Payer: Self-pay

## 2024-10-21 ENCOUNTER — Other Ambulatory Visit: Payer: Self-pay
